# Patient Record
Sex: Female | Born: 1945 | Race: White | Hispanic: No | Smoking: Never smoker
Health system: Southern US, Community
[De-identification: ages and names within clinical notes are randomized; demographics above are authoritative.]

## PROBLEM LIST (undated history)

## (undated) DIAGNOSIS — J45909 Unspecified asthma, uncomplicated: Secondary | ICD-10-CM

## (undated) DIAGNOSIS — C50919 Malignant neoplasm of unspecified site of unspecified female breast: Secondary | ICD-10-CM

## (undated) DIAGNOSIS — J189 Pneumonia, unspecified organism: Secondary | ICD-10-CM

## (undated) DIAGNOSIS — R51 Headache: Secondary | ICD-10-CM

## (undated) DIAGNOSIS — R519 Headache, unspecified: Secondary | ICD-10-CM

## (undated) DIAGNOSIS — Z8489 Family history of other specified conditions: Secondary | ICD-10-CM

## (undated) HISTORY — DX: Malignant neoplasm of unspecified site of unspecified female breast: C50.919

## (undated) HISTORY — PX: TONSILLECTOMY: SUR1361

## (undated) HISTORY — PX: DILATION AND CURETTAGE OF UTERUS: SHX78

---

## 1975-06-04 HISTORY — PX: TUBAL LIGATION: SHX77

## 2003-08-25 ENCOUNTER — Emergency Department (HOSPITAL_COMMUNITY): Admission: EM | Admit: 2003-08-25 | Discharge: 2003-08-25 | Payer: Self-pay | Admitting: *Deleted

## 2006-02-21 ENCOUNTER — Ambulatory Visit (HOSPITAL_BASED_OUTPATIENT_CLINIC_OR_DEPARTMENT_OTHER): Admission: RE | Admit: 2006-02-21 | Discharge: 2006-02-21 | Payer: Self-pay | Admitting: Orthopedic Surgery

## 2008-06-03 HISTORY — PX: FINGER FRACTURE SURGERY: SHX638

## 2008-06-03 HISTORY — PX: WRIST FRACTURE SURGERY: SHX121

## 2013-07-04 HISTORY — PX: BREAST BIOPSY: SHX20

## 2013-08-03 ENCOUNTER — Ambulatory Visit (INDEPENDENT_AMBULATORY_CARE_PROVIDER_SITE_OTHER): Payer: Commercial Managed Care - HMO | Admitting: General Surgery

## 2013-08-03 ENCOUNTER — Encounter (INDEPENDENT_AMBULATORY_CARE_PROVIDER_SITE_OTHER): Payer: Self-pay | Admitting: General Surgery

## 2013-08-03 ENCOUNTER — Encounter (INDEPENDENT_AMBULATORY_CARE_PROVIDER_SITE_OTHER): Payer: Self-pay

## 2013-08-03 VITALS — BP 142/80 | HR 84 | Temp 98.4°F | Resp 16 | Ht 67.0 in | Wt 164.4 lb

## 2013-08-03 DIAGNOSIS — C50219 Malignant neoplasm of upper-inner quadrant of unspecified female breast: Secondary | ICD-10-CM

## 2013-08-03 DIAGNOSIS — C50211 Malignant neoplasm of upper-inner quadrant of right female breast: Secondary | ICD-10-CM | POA: Insufficient documentation

## 2013-08-03 NOTE — Progress Notes (Addendum)
Patient ID: Wendy [name], female   DOB: 1946-03-04, 68 y.o.   MRN: [id number]  Chief Complaint  Patient presents with  . Breast Cancer    new pt- eval rt breast cancer    HPI Wendy [name] is a 68 y.o. female.  She is referred to me by Dr. Nicki Reaper at [facility] in [place], and by Dr. 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  The patient has no prior history of breast problems. She gets annual mammograms. Her daughter is a Marine scientist. Her husband and daughter are here with her today. Recent screening mammograms revealed an abnormality in the right breast at the 3:00 position. She had a diagnostic right breast mammogram and ultrasound but I do not have those reports yet. The patient and daughter said that she was told this was about a half an inch in diameter. Image guided biopsy of the right breast at 3:00 position reveals invasive ductal carcinoma and some DCIS. 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 [id number]%. She has had no further evaluation or imaging.  Family history is negative for breast or ovarian cancer. It is positive for colon and bladder cancer.  We spent a longtime talking about things. One of the first thing she talked about is wanting to have a mastectomy and possible bilateral masectomies. I think she is still open I did about a lumpectomy but leaning toward mastectomy. I told her to think about whether she would want Reconstruction if she had mastectomy.   Comorbidities are minimal. She is on a prednisone dosepak recently because of bronchitis which happens once the winter. She otherwise is pretty healthy. She does not smoke.  HPI  History reviewed. No pertinent past medical history.  Past Surgical History  Procedure Laterality Date  . Tubal ligation  1977  . Hand surgery  2010    Family History  Problem Relation Age of Onset  . Cancer Sister      bladder  . Cancer Brother     colon  . Cancer Maternal Uncle     colon    Social History History  Substance Use Topics  . Smoking status: Never Smoker   . Smokeless tobacco: Not on file  . Alcohol Use: No    No Known Allergies  Current Outpatient Prescriptions  Medication Sig Dispense Refill  . albuterol (PROVENTIL) (2.5 MG/3ML) 0.083% nebulizer solution as needed.      Marland Kitchen CALCIUM PO Take by mouth.      . predniSONE (DELTASONE) 10 MG tablet as directed.       No current facility-administered medications for this visit.    Review of Systems Review of Systems  Constitutional: Negative for fever, chills and unexpected weight change.  HENT: Negative for congestion, hearing loss, sore throat, trouble swallowing and voice change.   Eyes: Negative for visual disturbance.  Respiratory: Negative for cough and wheezing.   Cardiovascular: Negative for chest pain, palpitations and leg swelling.  Gastrointestinal: Negative for nausea, vomiting, abdominal pain, diarrhea, constipation, blood in stool, abdominal distention and anal bleeding.  Genitourinary: Negative for hematuria, vaginal bleeding and difficulty urinating.  Musculoskeletal: Negative for arthralgias.  Skin: Negative for rash and wound.  Neurological: Negative for seizures, syncope and headaches.  Hematological: Negative for adenopathy. Does not bruise/bleed easily.  Psychiatric/Behavioral: Negative for confusion.    Blood pressure 142/80, pulse 84, temperature 98.4 F (36.9 C), temperature source Oral,  resp. rate 16, height 5\' 7"  (1.702 m), weight 164 lb 6.4 oz (74.571 kg).  Physical Exam Physical Exam  Constitutional: She is oriented to person, place, and time. She appears well-developed and well-nourished. No distress.  HENT:  Head: Normocephalic and atraumatic.  Nose: Nose normal.  Mouth/Throat: No oropharyngeal exudate.  Eyes: Conjunctivae and EOM are normal. Pupils are equal, round, and reactive to light. Left  eye exhibits no discharge. No scleral icterus.  Neck: Neck supple. No JVD present. No tracheal deviation present. No thyromegaly present.  Cardiovascular: Normal rate, regular rhythm, normal heart sounds and intact distal pulses.   No murmur heard. Pulmonary/Chest: Effort normal and breath sounds normal. No respiratory distress. She has no wheezes. She has no rales. She exhibits no tenderness.  Small ecchymoses at 3:00 position right breast, but no mass in either breast. No other skin changes. No axillary adenopathy.  Abdominal: Soft. Bowel sounds are normal. She exhibits no distension and no mass. There is no tenderness. There is no rebound and no guarding.  Musculoskeletal: She exhibits no edema and no tenderness.  Lymphadenopathy:    She has no cervical adenopathy.  Neurological: She is alert and oriented to person, place, and time. She exhibits normal muscle tone. Coordination normal.  Skin: Skin is warm. No rash noted. She is not diaphoretic. No erythema. No pallor.  Psychiatric: She has a normal mood and affect. Her behavior is normal. Judgment and thought content normal.    Data Reviewed Screening mammograms report. Biopsy report. Pathology report. Breast diagnostic profile. I have requested report and a disc of her should diagnostic mammogram and right breast ultrasound. MRI is scheduled. Medical oncology consultation scheduled. Assessment    Invasive ductal carcinoma right breast, 3:00 position. Clinically this is stage I, but I will need more information about tumor dimensions. This is a triple negative breast cancer, and so she will likely be offered adjuvant chemotherapy. I've discussed this with her and she is willing to have chemotherapy  We had a long talk about the role of surgery, chemotherapy, and radiation therapy in her care. We talked about lumpectomy sentinel lead biopsy radiation therapy. We talked about mastectomy, unilateral or bilateral. She is aware that she'll receive  chemotherapy regardless of the extent of surgery. She does as she has some decisions to make.  I told her to hold off making final decisions until she is evaluated by a medical oncologist.      She understands that she will likely be offered adjuvant chemotherapy and will likely need a Port-A-Cath. She is accepting of this.  I would also like for her to have bilateral breast MRI before making final recommendations. ADDENDUM: MRI shows the 1.5 cm mass in the 3:00 position on the right consistent with a known invasive ductal carcinoma. Nodular enhancement extends at 6.1 cm posterior from the mass worrisome for malignancy. Also there is indeterminate 1 cm area of non-masslike enhancement in the lower outer quadrant of the right breast. MRI guided biopsy of the posterior nodular enhancement was recommended. The left breast showed no mass or abnormal enhancement. There are no abnormal appearing lymph nodes. The patient will see me in the office day after tomorrow, and we will discuss this in detail.    Plan    Ambulatory referral to one of our medical oncologist ..she agrees.  Bilateral breast MRI  She was given breast cancer information package and carrying case  Return to see me after the MRI a medical oncology consultation for final  surgical incision planning.  ADDENDUM:  Dr. Marcy Panning sent me a brief note stating that the patient is interested in bilateral mastectomy upfront and that she will need a Port-A-Cath for eventual chemotherapy. She has an appointment to see me on March 11. We will finalize our plans at that time.       Edsel Petrin. Dalbert Batman, M.D., [facility] General and Minimally invasive Surgery Breast and Colorectal Surgery Office:   [phone] Pager:   [phone]  08/03/2013, 3:35 PM

## 2013-08-03 NOTE — Patient Instructions (Signed)
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 I suspect that this is an early breast cancer. There is a possibility that his spread to the lymph nodes, although I do not feel any abnormal lymph nodes.  We're going to get a copy of the reports and a disc of your diagnostic mammogram and ultrasound.  You'll be scheduled for bilateral breast MRI.  You'll be scheduled for a medical oncology consultation immediately.  Return to see Dr. Dalbert Batman as soon as possible after the MRI and the medical oncology consultation are completed, and we will discuss, decide  and plan definitive surgery at that time.     Mastectomy, With or Without Reconstruction Mastectomy (removal of the breast) is a procedure most commonly used to treat cancer (tumor) of the breast. Different procedures are available for treatment. This depends on the stage of the tumor (abnormal growths). Discuss this with your caregiver, surgeon (a specialist for performing operations such as this), or oncologist (someone specialized in the treatment of cancer). With proper information, you can decide which treatment is best for you. Although the sound of the word cancer is frightening to all of [place], the new treatments and medications can be a source of reassurance and comfort. If there are things you are worried about, discuss them with your caregiver. He or she can help comfort you and your family. Some of the different procedures for treating breast cancer are:  Radical (extensive) mastectomy. This is an operation used to remove the entire breast, the muscles under the breast, and all of the glands (lymph nodes) under the arm. With all of the new treatments available for cancer of the breast, this procedure has become less common.  Modified radical mastectomy. This is a similar operation to the radical mastectomy described above. In the modified radical mastectomy, the muscles of the chest wall  are not removed unless one of the lessor muscles is removed. One of the lessor muscles may be removed to allow better removal of the lymph nodes. The axillary lymph nodes are also removed. Rarely, during an axillary node dissection nerves to this area are damaged. Radiation therapy is then often used to the area following this surgery.  A total mastectomy also known as a complete or simple mastectomy. It involves removal of only the breast. The lymph nodes and the muscles are left in place.  In a lumpectomy, the lump is removed from the breast. This is the simplest form of surgical treatment. A sentinel lymph node biopsy may also be done. Additional treatment may be required. RISKS AND COMPLICATIONS The main problems that follow removal of the breast include:  Infection (germs start growing in the wound). This can usually be treated with antibiotics (medications that kill germs).  Lymphedema. This means the arm on the side of the breast that was operated on swells because the lymph (tissue fluid) cannot follow the main channels back into the body. This only occurs when the lymph nodes have had to be removed under the arm.  There may be some areas of numbness to the upper arm and around the incision (cut by the surgeon) in the breast. This happens because of the cutting of or damage to some of the nerves in the area. This is most often unavoidable.  There may be difficulty moving the arm in a full range of motion (moving in all directions) following surgery. This usually improves with time following use and exercise.  Recurrence of breast cancer may happen with the very best of surgery and follow up treatment. Sometimes small cancer cells that cannot be seen with the naked eye have already spread at the time of surgery. When this happens other treatment is available. This treatment may be radiation, medications or a combination of both. RECONSTRUCTION Reconstruction of the breast may be done  immediately if there is not going to be post-operative radiation. This surgery is done for cosmetic (improve appearance) purposes to improve the physical appearance after the operation. This may be done in two ways:  It can be done using a saline filled prosthetic (an artificial breast which is filled with salt water). Silicone breast implants are now re-approved by the FDA and are being commonly used.  Reconstruction can be done using the body's own muscle/fat/skin. Your caregiver will discuss your options with you. Depending upon your needs or choice, together you will be able to determine which procedure is best for you. Document Released: 02/12/2001 Document Revised: 02/12/2012 Document Reviewed: 10/06/2007 [facility] Patient Information 2014 [place].        Breast Cancer, Early Stage, Surgery Choices Breast cancer is the most common cancer, except for skin cancer. It is the second most common cause of death, except for lung cancer, in women. The risk of a woman developing breast cancer is 12.5%. Breast cancer in women younger than 71 years old is rare. Most of these cancer cases have spread to the breast from another part of the body (metastatic). Finding out that you have breast cancer is an emotional shock and is difficult to understand, because it is an overwhelming, serious, and life-threatening disease. You will need to make important decisions about how you want it treated.  As a woman with early-stage breast cancer (DCIS or Stage I, IIA, IIB, IIIA, or IV) you may be able to choose which type of breast surgery to have. Your caregiver will help you understand what stage you are in. Often, your choices are:  Removal of the cancerous part(s) of the breast (breast-sparing surgery).  Lumpectomy.  Partial/Segmental mastectomy.  Removal of the whole breast (simple mastectomy). Research shows that women with early-stage breast cancer who have breast-sparing surgery along with  radiation therapy live as long as those who have a mastectomy. Most women with breast cancer will lead long, healthy lives after treatment.  Reconstructive surgery. This type of surgery is to make the breast and nipple area as normal looking as it was before the mastectomy. It is usually done by a plastic surgeon at the same time as the mastectomy. There are several types of reconstructive surgery that should be discussed with your caregiver and plastic surgeon.  Lymph node surgery.  Sentinel (removing those lymph nodes in the armpit that breast cancer spreads to first).  Axillary lymph node dissection (removing all the lymph nodes in the armpit). TREATMENT Treatment for breast cancer usually begins a few weeks after diagnosis. In these weeks, you should meet with a surgeon, learn the facts about your surgery choices, treatment options, get a second opinion, and think about what is important to you.  Treatment choices include:  Surgery.  Radiation.  Chemotherapy.  Medicines, hormones.  Reconstructive breast surgery.  Any combination of the above treatments. Choose which kind of surgery to have. If radiation, chemotherapy, or hormone therapy is considered, this also should be discussed before the surgery. Radical breast surgery is rarely performed now. Usually, lumpectomy, partial/segmental mastectomy, simple mastectomy in combination with radiation, chemotherapy, and/or  hormone treatment is recommended. Clinical trial protocols (specific treatment plan with surgery and radiation, chemotherapy, and hormones) for breast cancer have been put in place in hospitals, universities, medical schools, and cancer centers all over the country. These patients are followed for several years to find out what treatment gives the best results for the protocol given, for the different stages of breast cancer.  Most women want to make this choice with help from their caregiver. After all, the kind of surgery  you have will affect how you look and feel. But it is often hard to decide what to do. The more information you have, the better the decision you can make.  Talk to a surgeon about your breast cancer surgery choices. Find out what happens during surgery, types of problems that sometimes occur, and other kinds of treatment (if any) you will need after surgery. Be sure to ask a lot of questions and learn as much as you can. You may also wish to talk with family members, friends, or others who have had breast cancer surgery, radiation therapy, chemotherapy, or hormone therapy.  After talking with a surgeon, you may want a second opinion. This means talking with another doctor or surgeon who might tell you about other treatment options. They may simply give you information that can help you feel better about the choice you are making. Do not worry about hurting your surgeon's feelings. It is common practice to get a second opinion, and some insurance companies require it. Plus, it is better to get a second opinion than to worry that you have made the wrong choice. STAGES OF BREAST CANCER Staging of breast cancer depends on the size of the tumor, if and how far it has spread, lymph node involvement in the armpits, and whether it has spread to other parts of the body. If you are unsure of the stage of your cancer, ask your caregiver. The following are the stages of breast cancer:  Stage 0: This means you either have DCIS or LCIS.  DCIS (Ductal Carcinoma in Situ) is very early breast cancer. It is often too small to form a lump. Your caregiver may refer to DCIS as noninvasive cancer.  LCIS (Lobular Carcinoma in Situ) is not cancer, but it may increase the chance that you will get breast cancer. Talk with your caregiver about treatment options, if you are diagnosed with LCIS.  The 5 year survival rate in this stage is almost 100%.  Stage I:  Your cancer is less than 1 inch across (2 centimeters) or about  the size of a quarter. The cancer is only in the breast. It has not spread to lymph nodes or other parts of your body.  Stage IIA:  No cancer is found in your breast. However, cancer is found in the lymph nodes under your arm, or  Your cancer is 1 inch (2 centimeters) or smaller. It has spread to 3 lymph nodes or less (the lymph nodes under your arm), or  Your cancer is about 1-2 inches (2-5 centimeters). It has not spread to the lymph nodes under your arm.  Stage IIB:  Your cancer is about 1-2 inches (2-5 centimeters). It has spread to the lymph nodes under your arm, or  Your cancer is larger than 2 inches (5 centimeters). It has not spread to the lymph nodes under your arm.  The 5 year survival rate is 85 to 90%.  Stage IIIA:  No cancer is found in the breast. It  is found in lymph nodes under your arm. The lymph nodes are attached to each other, or  Your cancer is 2 inches (5 centimeters) or smaller. It has spread to lymph nodes under your arm. The lymph nodes are attached to each other, or  Your cancer is larger than 2 inches (5 centimeters) and has spread to lymph nodes under your arm, and they are not attached to each other.  Your cancer is smaller than 2 inches (5 centimeters) and has spread to lymph nodes above your collarbone.  Inflammatory cancer of the breast (red rash, tender and swollen breasts) is in the stage III category.  The 5 year survival rate is 45 to 67%.  Stage IV:  Cancer cells have spread to other parts of the body.  The 5 year survival rate is about 20%. ABOUT LYMPH NODES  Lymph nodes are part of your body's immune system, which helps fight infection and disease. Lymph nodes are small, round, and clustered (like a bunch of grapes) throughout your body.  Axillary lymph nodes are in the area under your arm. Breast cancer may spread to these lymph nodes first, even when the tumor in the breast is small. This is why most surgeons take out some of these  lymph nodes at the time of breast surgery.  Lymphedema is swelling caused by a buildup of lymph fluid. You may have this type of swelling in your arm, if your lymph nodes are taken out with surgery or damaged by radiation therapy.  Lymphedema can show up soon after surgery. The symptoms are often mild and last for a short time.  Lymphedema can show up months or even years after cancer treatment is over. Often, lymphedema develops after an insect bite, minor injury, or burn on the arm where your lymph nodes were removed. Sometimes, this can be painful. One way to reduce the swelling is to work with a caregiver who specializes in rehabilitation, or with a physical therapist.  Sentinel lymph node biopsy is surgery to remove as few lymph nodes as possible from under the arm. The surgeon first injects a dye in the breast to see which lymph nodes the breast tumor drains into. Then, he or she removes these nodes to see if they have any cancer. If there is no cancer, the surgeon may leave the other lymph nodes in place. This surgery is fairly new and is being studied experimentally. Talk with your surgeon if you want to learn more. COMPARE YOUR CHOICES  BREAST-SPARING SURGERY  Is this surgery right for me?   Breast-sparing surgery with radiation is a safe choice for most women who have early-stage breast cancer. This means that your cancer is DCIS or at Stage I, IIA, IIB, or IIIA. What are the names of the different kinds of surgery?  Lumpectomy.  Partial mastectomy.  Breast-sparing surgery.  Segmental mastectomy.  Simple mastectomy.  Sentinel lymph node removal.  Axillary lymph node excision. What doctors am I likely to see?  Oncologist.  Surgeon.  Radiation Oncologist.  Chemotherapy Oncologist. What will my breast look like after surgery?  Your breast should look a lot like it did before surgery. But if your tumor is large, your breast may look different or smaller after  breast-sparing surgery. Will I have feeling in the area around my breast?  Yes. You should still have feeling in your breast, nipple, and areola (dark area around your nipple). Will I have pain after the surgery?  You may have pain after  surgery. Talk with your caregiver about ways to control this pain. What other problems can I expect?  You may feel very tired after radiation therapy. You may get swelling in your arm (lymphedema). Will I need more surgery?  Maybe. You may need more surgery to remove lymph nodes from under your arm. Also, if the surgeon does not remove all your cancer the first time, you may need more surgery. What other types of treatment will I need?  You may need radiation therapy, given almost every day for 5 to 8 weeks. You may also need chemotherapy, hormone therapy, or both. Will insurance pay for my surgery?  Check with your insurance company to find out how much it pays for breast cancer surgery and other needed treatments. Will the type of surgery I have affect how long I live?  Women with early-stage breast cancer who have breast-sparing surgery followed by radiation live just as long as women who have a mastectomy. Most women with breast cancer will lead long, healthy lives after treatment. What are the chances that my cancer will come back after surgery?  About 10% of women who have breast-sparing surgery along with radiation therapy get cancer in the same breast within 12 years. If this happens, you will need a mastectomy, but it will not affect how long you live. MASTECTOMY SURGERY Is this surgery right for me?   Mastectomy is a safe choice for women who have early-stage breast cancer (DCIS, Stage I, IIA, IIB, or IIIA). You may need a mastectomy if:  You have small breasts and a large tumor.  You have cancer in more than one part of your breast.  The tumor is under the nipple.  You do not have access to radiation therapy. What are the names of the  different kinds of surgery?  Total mastectomy.  Modified radical mastectomy (not usually done now).  Double mastectomy.  Simple mastectomy. What doctors am I likely to see?  Oncologist.  Surgeon.  Radiation Oncologist.  Chemotherapy Oncologist. What will my breast look like after surgery?  Your breast and nipple will be removed. You will have a flat chest on the side of your body where the breast was removed. Will I have feeling in the area around my breast?  Maybe. After surgery, you will have no feeling (numb) in your chest wall and maybe also under your arm. This numb feeling should go away in 1-2 years, but it will never feel like it did before. Also, the skin where your breast was may feel tight. Will I have pain after the surgery?  You may have pain after surgery. Talk with your caregiver about ways to control this pain. What other problems can I expect?  You may have pain in your neck or back. You may feel out of balance, if you had large breasts and do not have reconstruction surgery. You may get swelling in your arm (lymphedema). Will I need more surgery?  Maybe. You may need surgery to remove lymph nodes from under your arm. Also, if you have problems after your mastectomy, you may need to see your surgeon for treatment. What other types of treatment will I need?  You may also need chemotherapy, hormone therapy, or radiation therapy. Some women get all 3 types of therapy or any combination of the 3. Will insurance pay for my surgery?  Check with your insurance company to find out how much it pays for breast cancer surgery and other needed treatments. Will the  type of surgery I have affect how long I live?  Women with early-stage breast cancer who have a mastectomy live as long as women who have breast-sparing surgery followed by radiation therapy. Most women with breast cancer will lead long, healthy lives after treatment. What are the chances that my cancer will  come back after surgery?  About 5% of women who have a mastectomy will get cancer on the same side of their chest within 12 years. MASTECTOMY AND BREAST RECONSTRUCTION SURGERY  Is this surgery right for me?  If you have a mastectomy, you might also want breast reconstruction surgery.  You can choose to have reconstruction surgery at the same time as your mastectomy.  You can wait and have it at a later date. What are the names of the different kinds of surgery?  Breast implant.  Tissue flap surgery.  Reconstructive plastic surgery. What doctors am I likely to see?  Oncologist.  Surgeon.  Radiation Oncologist.  Reconstructive Plastic Surgeon.  Chemotherapy Oncologist. What will my breast look like after surgery?  Although you will have a breast-like shape, your breast will not look the same as it did before surgery. Will I have feeling in the area around my breast?  No. The area around your breast will always be numb (have no feeling). Will I have pain after the surgery?  You are likely to have pain after major surgery, such as mastectomy and reconstruction surgery. There are many ways to deal with pain. Let your caregiver know if you need relief from pain. What other problems can I expect?  It may take you many weeks or even months to recover from breast reconstruction surgery. If you have an implant, you may get infections, pain, or hardness. Also, you may not like how your breast-like shape looks. You may need more surgery if your implant breaks or leaks. If you have tissue flap surgery, you may lose strength in the part of your body where the flap came from. You may get swelling in your arm (lymphedema). Will I need more surgery?  Yes. You will need surgery at least 2 more times to build a new breast-like shape. With implants, you may need more surgery months or years later. You may also need surgery to remove lymph nodes from under your arm. What other types of  treatment will I need?  You may need chemotherapy, hormone therapy, or radiation therapy. Some women get all 3 types of therapy or any combination of the 3. Will insurance pay for my surgery?  Check with your insurance company to find out if it pays for breast reconstruction surgery. You should also ask if your insurance will pay for problems that may result from breast reconstruction surgery. Will the type of surgery I have affect how long I live?  Women with early-stage breast cancer who have a mastectomy live the same amount of time as women who have breast-sparing surgery followed by radiation therapy. Most women with breast cancer will lead long, healthy lives after treatment. What are the chances that my cancer will come back after surgery?  About 5% of women who have a mastectomy will get cancer on the same side of their chest within 12 years. Breast reconstruction surgery does not affect the chances of your cancer coming back. Where can I learn more about coping with life after cancer? To learn more about life after cancer, you might want to read "Facing Forward: Life After Cancer Treatment." You can get this booklet  at [employer].co.za or by calling 1-800-4-CANCER. THINK ABOUT WHAT IS IMPORTANT TO YOU   After you have talked with your surgeon and learned the facts, you may also want to talk with your spouse or partner, family, friends, or other women who have had breast cancer surgery. The more you know about breast cancer, the treatment, and what happens afterward, the more informed you will be and the easier it will be for you to make good decisions that are important to you.  Think about what is important to you. Here are some questions to think about:  Do I want to get a second opinion?  How important is it to me how my breast looks after cancer surgery?  How important is it to me how my breast feels after cancer surgery?  If I have breast-sparing surgery or more  extensive surgery, am I willing to get radiation, hormone and/or chemotherapy?  If I have a mastectomy, do I also want breast reconstruction surgery?  If I have breast reconstruction surgery, do I want it at the same time as my mastectomy?  What treatment does my insurance cover, and what do I need to pay for?  Who would I like to talk with about my surgery, radiation, hormone, and chemotherapy choices?  What else do I want to know, do, or learn before I make my choice about breast cancer surgery?  After I have learned all I could and have talked with my surgeon, I will make a choice that feels right for me.  A patient who takes the time to be well-informed will feel more comfortable about her decisions regarding surgery, and will feel better about the procedure following the surgery.  Coping and Support:  Be open and willing to talk to your family and friends about your cancer. Family and friends can be your best support group, to help you work through your concerns and worries.  Discussing your thoughts, concerns, and intimacy issues with your spouse or partner is very important and helpful.  Join support groups to share and learn how others with breast cancer cope and deal with their cancer. The [employer] Society can help you find support groups in your area.  Breast cancer may affect your confidence and feelings about being a total woman. It may interfere with your intimate relationship with your partner. Counseling may be necessary to help you overcome these feelings.  Talk to a counselor, your clergyperson, psychologist, or psychiatrist.  Talk to a medical social worker, if you have financial questions or problems.  Do not be afraid to ask for help, especially during your treatment.  Learn to be as independent as possible, as soon as possible. Document Released: 08/10/2003 Document Revised: 09/14/2012 Document Reviewed: 04/17/2009 [facility] Patient Information 2014  [place], [place].

## 2013-08-04 ENCOUNTER — Telehealth (INDEPENDENT_AMBULATORY_CARE_PROVIDER_SITE_OTHER): Payer: Self-pay | Admitting: *Deleted

## 2013-08-04 NOTE — Telephone Encounter (Signed)
I spoke with pt and informed her of the appt for her breast MRI at [id number] on 08/09/13 with an arrival time of 7:15am.  She is agreeable with this appt.

## 2013-08-05 ENCOUNTER — Telehealth: Payer: Self-pay | Admitting: *Deleted

## 2013-08-05 ENCOUNTER — Other Ambulatory Visit (INDEPENDENT_AMBULATORY_CARE_PROVIDER_SITE_OTHER): Payer: Self-pay

## 2013-08-05 DIAGNOSIS — C50919 Malignant neoplasm of unspecified site of unspecified female breast: Secondary | ICD-10-CM

## 2013-08-05 NOTE — Telephone Encounter (Signed)
Called pt back and spoke with her daughter Berline Chough) and informed her that we received the referral and I have gave the paperwork to Dr. Humphrey Rolls for an appt and I will contact her as soon as I get that back from her.  She has paperwork to bring to me for Dr. Humphrey Rolls and will bring tomorrow after her mom's MRI appt.  I informed Varney Biles so we can go ahead and get her an Alight bag with a [employer] and give her the Before Appt letter with Intake form.

## 2013-08-06 ENCOUNTER — Encounter (INDEPENDENT_AMBULATORY_CARE_PROVIDER_SITE_OTHER): Payer: Self-pay

## 2013-08-06 ENCOUNTER — Other Ambulatory Visit: Payer: Self-pay | Admitting: *Deleted

## 2013-08-06 ENCOUNTER — Encounter: Payer: Self-pay | Admitting: *Deleted

## 2013-08-06 DIAGNOSIS — C50219 Malignant neoplasm of upper-inner quadrant of unspecified female breast: Secondary | ICD-10-CM

## 2013-08-06 NOTE — Progress Notes (Signed)
Completed chart, Dawn entered labs, added to spreadsheet and placed in Dr. Laurelyn Sickle box.

## 2013-08-09 ENCOUNTER — Ambulatory Visit
Admission: RE | Admit: 2013-08-09 | Discharge: 2013-08-09 | Disposition: A | Discharge status: Home / Self Care | Payer: Commercial Managed Care - HMO | Source: Ambulatory Visit | Attending: General Surgery | Admitting: General Surgery

## 2013-08-09 DIAGNOSIS — C50211 Malignant neoplasm of upper-inner quadrant of right female breast: Secondary | ICD-10-CM

## 2013-08-09 MED ORDER — GADOBENATE DIMEGLUMINE 529 MG/ML IV SOLN
15.0000 mL | Freq: Once | INTRAVENOUS | Status: AC | PRN
  Administered 2013-08-09: 15 mL via INTRAVENOUS

## 2013-08-10 ENCOUNTER — Encounter (INDEPENDENT_AMBULATORY_CARE_PROVIDER_SITE_OTHER): Payer: Commercial Managed Care - HMO | Admitting: General Surgery

## 2013-08-10 ENCOUNTER — Ambulatory Visit (HOSPITAL_BASED_OUTPATIENT_CLINIC_OR_DEPARTMENT_OTHER): Payer: Commercial Managed Care - HMO

## 2013-08-10 ENCOUNTER — Ambulatory Visit (HOSPITAL_BASED_OUTPATIENT_CLINIC_OR_DEPARTMENT_OTHER): Payer: Commercial Managed Care - HMO | Admitting: Oncology

## 2013-08-10 ENCOUNTER — Encounter: Payer: Self-pay | Admitting: Oncology

## 2013-08-10 ENCOUNTER — Other Ambulatory Visit (HOSPITAL_BASED_OUTPATIENT_CLINIC_OR_DEPARTMENT_OTHER): Payer: Commercial Managed Care - HMO

## 2013-08-10 VITALS — BP 149/74 | HR 86 | Temp 98.1°F | Resp 18 | Ht 67.0 in | Wt 166.0 lb

## 2013-08-10 DIAGNOSIS — C50219 Malignant neoplasm of upper-inner quadrant of unspecified female breast: Secondary | ICD-10-CM

## 2013-08-10 DIAGNOSIS — C50919 Malignant neoplasm of unspecified site of unspecified female breast: Secondary | ICD-10-CM

## 2013-08-10 DIAGNOSIS — C50211 Malignant neoplasm of upper-inner quadrant of right female breast: Secondary | ICD-10-CM

## 2013-08-10 DIAGNOSIS — Z171 Estrogen receptor negative status [ER-]: Secondary | ICD-10-CM

## 2013-08-10 LAB — CBC WITH DIFFERENTIAL/PLATELET
BASO%: 0.4 % (ref 0.0–2.0)
Basophils Absolute: 0 10*3/uL (ref 0.0–0.1)
EOS%: 0.1 % (ref 0.0–7.0)
Eosinophils Absolute: 0 10*3/uL (ref 0.0–0.5)
HCT: 41 % (ref 34.8–46.6)
HGB: 13.5 g/dL (ref 11.6–15.9)
LYMPH#: 0.8 10*3/uL — AB (ref 0.9–3.3)
LYMPH%: 11.9 % — AB (ref 14.0–49.7)
MCH: 29.6 pg (ref 25.1–34.0)
MCHC: 32.9 g/dL (ref 31.5–36.0)
MCV: 89.9 fL (ref 79.5–101.0)
MONO#: 0.3 10*3/uL (ref 0.1–0.9)
MONO%: 4.7 % (ref 0.0–14.0)
NEUT#: 5.6 10*3/uL (ref 1.5–6.5)
NEUT%: 82.9 % — ABNORMAL HIGH (ref 38.4–76.8)
Platelets: 206 10*3/uL (ref 145–400)
RBC: 4.56 10*6/uL (ref 3.70–5.45)
RDW: 14.9 % — AB (ref 11.2–14.5)
WBC: 6.7 10*3/uL (ref 3.9–10.3)

## 2013-08-10 LAB — COMPREHENSIVE METABOLIC PANEL (CC13)
ALT: 15 U/L (ref 0–55)
AST: 17 U/L (ref 5–34)
Albumin: 3.8 g/dL (ref 3.5–5.0)
Alkaline Phosphatase: 58 U/L (ref 40–150)
Anion Gap: 9 mEq/L (ref 3–11)
BILIRUBIN TOTAL: 0.2 mg/dL (ref 0.20–1.20)
BUN: 17 mg/dL (ref 7.0–26.0)
CO2: 25 mEq/L (ref 22–29)
CREATININE: 0.9 mg/dL (ref 0.6–1.1)
Calcium: 9.8 mg/dL (ref 8.4–10.4)
Chloride: 108 mEq/L (ref 98–109)
GLUCOSE: 102 mg/dL (ref 70–140)
Potassium: 4.3 mEq/L (ref 3.5–5.1)
Sodium: 142 mEq/L (ref 136–145)
Total Protein: 7 g/dL (ref 6.4–8.3)

## 2013-08-10 NOTE — Progress Notes (Signed)
Wendy [name] [id number] [birth year] 68 y.o. 08/10/2013 4:07 PM  CC Dr. 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 Patient is seen for discussion of treatment options  STAGE:  Breast cancer of upper-inner quadrant of right female breast   Primary site: Breast (Right)   Staging method: AJCC 7th Edition   Clinical: Stage IA (T1c, NX, cM0) signed by [name], MD on 08/10/2013  4:11 PM   Summary: Stage IA (T1c, NX, cM0)    REFERRING PHYSICIAN: Dr. Fanny Skates  HISTORY OF PRESENT ILLNESS:  Wendy [name] is a 68 y.o. female.  Who has no significant past medical history. Most recently patient went for an annual mammogram and she was found to have an abnormality in the right breast at the 3:00 position. Subsequently had a right diagnostic mammogram performed. She also had a image guided biopsy of the right breast at the 3:00 position. 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 The tumor was ER negative PR negative HER-2/neu negative with a proliferation marker Ki-67 13%. All of her workup initially was performed in [place]. She subsequently was referred to Dr. Fanny Skates who saw her on 08/04/2003. He has recommended MRI of the breasts. This was performed on 08/09/2013. In the right breast there was an irregular enhancing mass in the 3:00 region measuring 1.5 x 1.0 x 1.4 cm. Also noted was nodular segmental enhancement extending 6.1 cm posteriorly from the mass towards the chest wall. The mass and nodular enhancement measured 7 cm. In the middle third of the lower outer quadrant of the right breast there was a 1.0 x 0.7 x 0.7 cm area of focal non-masslike enhancement. Left breast revealed no masses or abnormal enhancements. Lymph nodes appeared normal. With this information patient is now seen in medical oncology for discussion of treatment options. She herself is  without any complaints. She is accompanied by her husband as well as her daughter who is a Marine scientist. They have done a lot of research and reading.   Past Medical History: Past Medical History  Diagnosis Date  . Breast cancer     Past Surgical History: Past Surgical History  Procedure Laterality Date  . Tubal ligation  1977  . Hand surgery  2010    Family History: Family History  Problem Relation Age of Onset  . Cancer Sister     bladder  . Cancer Brother     colon  . Cancer Maternal Uncle     colon    Social History History  Substance Use Topics  . Smoking status: Never Smoker   . Smokeless tobacco: Never Used  . Alcohol Use: No    Allergies No Known Allergies  Current Medications: Current Outpatient Prescriptions  Medication Sig Dispense Refill  . albuterol (PROVENTIL) (2.5 MG/3ML) 0.083% nebulizer solution as needed.      Marland Kitchen CALCIUM PO Take by mouth.      . predniSONE (DELTASONE) 10 MG tablet as directed.       No current facility-administered medications for this visit.    OB/GYN History:menarche at 28, menopause in 4, age at first birth 66 (G90P2), HRT at 14 x 4-5 yrs  Fertility Discussion: N/A Prior History of Cancer: no  Health Maintenance:  Colonoscopy yes Bone Density yes Last PAP smear 2013  ECOG PERFORMANCE STATUS: 0 - Asymptomatic  Genetic Counseling/testing: yes   REVIEW OF SYSTEMS:  A comprehensive review of  systems was negative.  PHYSICAL EXAMINATION: Blood pressure 149/74, pulse 86, temperature 98.1 F (36.7 C), temperature source Oral, resp. rate 18, height '5\' 7"'  (1.702 m), weight 166 lb (75.297 kg).  General:  well-nourished in no acute distress.  Eyes:  no scleral icterus.  ENT:  There were no oropharyngeal lesions.  Neck was without thyromegaly.  Lymphatics:  Negative cervical, supraclavicular or axillary adenopathy.  Respiratory: lungs were clear bilaterally without wheezing or crackles.  Cardiovascular:  Regular rate and  rhythm, S1/S2, without murmur, rub or gallop.  There was no pedal edema.  GI:  abdomen was soft, flat, nontender, nondistended, without organomegaly.  Muscoloskeletal:  no spinal tenderness of palpation of vertebral spine.  Skin exam was without echymosis, petichae.  Neuro exam was nonfocal.  Patient was able to get on and off exam table without assistance.  Gait was normal.  Patient was alerted and oriented.  Attention was good.   Language was appropriate.  Mood was normal without depression.  Speech was not pressured.  Thought content was not tangential.   Breasts: Right breast reveals a slightly palpable mass in the 3:00 position likely hematoma otherwise no nipple discharge no other skin changes. Left breast no masses or nipple discharge.   STUDIES/RESULTS: Mr Breast Bilateral W Wo Contrast  08/09/2013   CLINICAL DATA:  Biopsy proven invasive ductal carcinoma and ductal carcinoma in situ in the 3 o'clock region of the right breast.  EXAM: BILATERAL BREAST MRI WITH AND WITHOUT CONTRAST  LABS:  BUN and creatinine were obtained on site at [facility] at  [address]  Results:  BUN 11 mg/dL,  Creatinine 1.0 mg/dL.  TECHNIQUE: Multiplanar, multisequence MR images of both breasts were obtained prior to and following the intravenous administration of 68m of MultiHance.  THREE-DIMENSIONAL MR IMAGE RENDERING ON INDEPENDENT WORKSTATION:  Three-dimensional MR images were rendered by post-processing of the original MR data on an independent workstation. The three-dimensional MR images were interpreted, and findings are reported in the following complete MRI report for this study. Three dimensional images were evaluated at the independent DynaCad workstation  COMPARISON:  Previous exams  FINDINGS: Breast composition: c:  Heterogeneous fibroglandular tissue  Background parenchymal enhancement: Moderate  Right breast: There is an irregular enhancing mass in the 3 o'clock region of the right breast  measuring 1.5 x 1.0 x 1.4 cm. Signal void artifact is seen in the mass from the biopsy clip. Nodular segmental enhancement extends 6.1 cm posteriorly from the mass towards the chest wall. The mass and nodular enhancement measures 7 cm. In the middle third of the lower outer quadrant of the left breast there is a 1.0 x 0.7 x 0.7 cm area of focal non masslike enhancement.  Left breast: No mass or abnormal enhancement.  Lymph nodes: No abnormal appearing lymph nodes.  Ancillary findings:  None.  IMPRESSION: 1.5 cm mass in the 3 o'clock region of the right breast corresponding with the patient's known invasive ductal carcinoma and ductal carcinoma in situ. Nodular enhancement extends 6.1 cm posteriorly from the mass worrisome for malignancy.  Indeterminate 1 cm area of non masslike enhancement in the lower outer quadrant of right breast.  RECOMMENDATION: MR guided core biopsy of the posterior nodular enhancement in the medial aspect of the right breast is recommended.  BI-RADS CATEGORY  4: Suspicious abnormality - biopsy should be considered.   Electronically Signed   By: DLillia MountainM.D.   On: 08/09/2013 12:34     LABS:  Chemistry   No results found for this basename: NA, K, CL, CO2, BUN, CREATININE, GLU   No results found for this basename: CALCIUM, ALKPHOS, AST, ALT, BILITOT      Lab Results  Component Value Date   WBC 6.7 08/10/2013   HGB 13.5 08/10/2013   HCT 41.0 08/10/2013   MCV 89.9 08/10/2013   PLT 206 08/10/2013     ASSESSMENT/PLAN    68 year old female with  #1 stage I (T1 N0) invasive ductal carcinoma of the right breast measuring 1.5 cm by MRI with a second area of concern the total measurement at 7 cm. She has another area in the right breast measuring 1.0 cm concerning again for malignancy. The biopsy tumor from the 3:00 position is triple negative with a proliferation marker Ki-67 of 13%. Patient has been seen by Dr. Fanny Skates. He discussed mastectomy versus a lumpectomy.  Patient's family and patient herself or telling me that she is leaning towards double mastectomy. She does not want to worry about disease again. We discussed the rationale for lumpectomy as well as mastectomies. Patient understands that whether or or not she gets mastectomies she will still need adjuvant chemotherapy. We discussed different treatment regimens.  #2 we discussed postop chemotherapy: This would consist of FEC every 2 weeks for a total of 6 cycles with day 2 Neulasta. This would then be followed by Taxol carboplatinum weekly for 12 weeks. We discussed risks benefits and side effects of the treatments very clearly. We discussed the rationale for utilizing carboplatinum in triple-negative disease.  #3 we also discussed genetics and genetic counseling. Patient is very interested in knowing and talking to the genetic counselor. An appointment will be set up for her.  #4 we discussed staging: Patient will be set up for CT of the chest abdomen and pelvis and PET scan.  #5 logistics of treatment: Patient will need an echocardiogram that she is getting epirubicin, she will also need chemotherapy teaching class, and a Port-A-Cath placement. Her port will be placed at the time of her definitive surgery.  #6 followup patient will be followed up after her surgery.  Clinical Trial Eligibility: no Multidisciplinary conference discussion yes                Discussion: Patient is being treated per NCCN breast cancer care guidelines appropriate for stage.I   Thank you so much for allowing me to participate in the care of Wendy [name]. I will continue to follow up the patient with you and assist in her care.  All questions were answered. The patient knows to call the clinic with any problems, questions or concerns. We can certainly see the patient much sooner if necessary.  I spent 55 minutes counseling the patient face to face. The total time spent in the appointment was 60  minutes.  Marcy Panning, MD Medical/Oncology [facility] [phone] (beeper) [phone] (Office)  08/10/2013, 4:07 PM

## 2013-08-10 NOTE — Progress Notes (Signed)
Checked in new patient with no financial issues. She has appt card and I gave her [facility] packet.

## 2013-08-11 ENCOUNTER — Ambulatory Visit (INDEPENDENT_AMBULATORY_CARE_PROVIDER_SITE_OTHER): Payer: Commercial Managed Care - HMO | Admitting: General Surgery

## 2013-08-11 ENCOUNTER — Encounter (INDEPENDENT_AMBULATORY_CARE_PROVIDER_SITE_OTHER): Payer: Self-pay | Admitting: General Surgery

## 2013-08-11 VITALS — BP 112/62 | HR 72 | Resp 12 | Wt 165.0 lb

## 2013-08-11 DIAGNOSIS — C50219 Malignant neoplasm of upper-inner quadrant of unspecified female breast: Secondary | ICD-10-CM

## 2013-08-11 DIAGNOSIS — C50211 Malignant neoplasm of upper-inner quadrant of right female breast: Secondary | ICD-10-CM

## 2013-08-11 NOTE — Patient Instructions (Signed)
We have discussed the findings on your MRI.  You had a medical oncology consultation with Dr. Marcy Panning. She has recommended adjuvant chemotherapy And genetic counseling.  You have stated that you would like to proceed with bilateral mastectomy, Port-A-Cath insertion, but you have declined consideration of immediate reconstruction at this point in time. Those are very reasonable decisions.  You will be scheduled for bilateral total mastectomies, right axillary sentinel node biopsy, and insertion of Port-A-Cath at Hospital in the near future as soon as possible.        Mastectomy, With or Without Reconstruction Mastectomy (removal of the breast) is a procedure most commonly used to treat cancer (tumor) of the breast. Different procedures are available for treatment. This depends on the stage of the tumor (abnormal growths). Discuss this with your caregiver, surgeon (a specialist for performing operations such as this), or oncologist (someone specialized in the treatment of cancer). With proper information, you can decide which treatment is best for you. Although the sound of the word cancer is frightening to all of [place], the new treatments and medications can be a source of reassurance and comfort. If there are things you are worried about, discuss them with your caregiver. He or she can help comfort you and your family. Some of the different procedures for treating breast cancer are:  Radical (extensive) mastectomy. This is an operation used to remove the entire breast, the muscles under the breast, and all of the glands (lymph nodes) under the arm. With all of the new treatments available for cancer of the breast, this procedure has become less common.  Modified radical mastectomy. This is a similar operation to the radical mastectomy described above. In the modified radical mastectomy, the muscles of the chest wall are not removed unless one of the lessor muscles is removed. One of the lessor  muscles may be removed to allow better removal of the lymph nodes. The axillary lymph nodes are also removed. Rarely, during an axillary node dissection nerves to this area are damaged. Radiation therapy is then often used to the area following this surgery.  A total mastectomy also known as a complete or simple mastectomy. It involves removal of only the breast. The lymph nodes and the muscles are left in place.  In a lumpectomy, the lump is removed from the breast. This is the simplest form of surgical treatment. A sentinel lymph node biopsy may also be done. Additional treatment may be required. RISKS AND COMPLICATIONS The main problems that follow removal of the breast include:  Infection (germs start growing in the wound). This can usually be treated with antibiotics (medications that kill germs).  Lymphedema. This means the arm on the side of the breast that was operated on swells because the lymph (tissue fluid) cannot follow the main channels back into the body. This only occurs when the lymph nodes have had to be removed under the arm.  There may be some areas of numbness to the upper arm and around the incision (cut by the surgeon) in the breast. This happens because of the cutting of or damage to some of the nerves in the area. This is most often unavoidable.  There may be difficulty moving the arm in a full range of motion (moving in all directions) following surgery. This usually improves with time following use and exercise.  Recurrence of breast cancer may happen with the very best of surgery and follow up treatment. Sometimes small cancer cells that cannot be seen with  the naked eye have already spread at the time of surgery. When this happens other treatment is available. This treatment may be radiation, medications or a combination of both. RECONSTRUCTION Reconstruction of the breast may be done immediately if there is not going to be post-operative radiation. This surgery is done  for cosmetic (improve appearance) purposes to improve the physical appearance after the operation. This may be done in two ways:  It can be done using a saline filled prosthetic (an artificial breast which is filled with salt water). Silicone breast implants are now re-approved by the FDA and are being commonly used.  Reconstruction can be done using the body's own muscle/fat/skin. Your caregiver will discuss your options with you. Depending upon your needs or choice, together you will be able to determine which procedure is best for you. Document Released: 02/12/2001 Document Revised: 02/12/2012 Document Reviewed: 10/06/2007 [facility] Patient Information 2014 [place].         Implanted Port Insertion An implanted port is a central line that has a round shape and is placed under the skin. It is used as a long-term IV access for:   Medicines, such as chemotherapy.   Fluids.   Liquid nutrition, such as total parenteral nutrition (TPN).   Blood samples.  LET [facility] CARE PROVIDER KNOW ABOUT:  Allergies to food or medicine.   Medicines taken, including vitamins, herbs, eye drops, creams, and over-the-counter medicines.   Any allergies to heparin.  Use of steroids (by mouth or creams).   Previous problems with anesthetics or numbing medicines.   History of bleeding problems or blood clots.   Previous surgery.   Other health problems, including diabetes and kidney problems.   Possibility of pregnancy, if this applies. RISKS AND COMPLICATIONS  Damage to the blood vessel, bruising, or bleeding at the puncture site.   Infection.  Blood clot in the vessel that the port is in.  Breakdown of the skin over your port.  Very rarely a person may develop a condition called a pneumothorax, a collection of air in the chest that may cause one of the lungs to collapse. The placement of these catheters with the appropriate imaging guidance significantly  decreases the risk of a pneumothorax.  BEFORE THE PROCEDURE   Your health care provider may want you to have blood tests. These tests can help tell how well your kidneys and liver are working. They can also show how well your blood clots.   If you take blood thinners (anticoagulant medicines), ask your health care provider when you should stop taking them.   Make arrangements for someone to drive you home. This is necessary if you have been sedated for your procedure.  PROCEDURE  Port insertion usually takes about 30 45 minutes.   An IV needle will be inserted in your arm. Medicine for pain and medicine to help relax you (sedative) will flow directly into your body through this needle.   You will lie on an exam table, and you will be connected to monitors to keep track of your heart rate, blood pressure, and breathing throughout the procedure.  An oxygen monitoring device may be attached to your finger. Oxygen will be given.   Everything is kept as germ free (sterile) as possible during the procedure. The skin near the point of the incision will be cleaned with antiseptic, and the area will be draped with sterile towels. The skin and deeper tissues over the port area will be made numb with a  local anesthetic.  Two small cuts (incisions) will be made in the skin to insert the port. One will be made in the neck to obtain access to the vein where the catheter will lie.   Because the port reservoir is placed under the skin, a small skin incision is made in the upper chest, and a small pocket for the port is made under the skin. The catheter connected to the port tunnels to a large central vein in the chest. A small, raised area remains on your body at the site of the reservoir when the procedure is complete.  The port placement is done under imaging guidance to ensure the proper placement.  The reservoir has a silicone covering that can be punctured with a special needle.   The port  is flushed with normal saline, and blood is drawn to make sure it is working properly.  There is nothing remaining outside the skin when the procedure is finished.   Incisions are held together by stitches, surgical glue, or a special tape.  AFTER THE PROCEDURE  You will stay in a recovery area until the anesthesia has worn off. Your blood pressure and pulse will be checked.  A final chest X-ray is taken to check the placement of the port and that there is no injury to your lung.  If there are no problems, you should be able to go home after the procedure.  Document Released: 03/10/2013 Document Reviewed: 01/25/2013 [facility] Patient Information 2014 [place], [place].

## 2013-08-11 NOTE — Progress Notes (Signed)
Patient ID: Wendy [name], female   DOB: Jan 07, 1946, 68 y.o.   MRN: [id number]  No chief complaint on file.   HPI Wendy [name] is a 68 y.o. female.  She returns for further discussion and surgical treatment planning for her right breast cancer.  Her initial presentation is detailed below: . She was referred to me by Dr. Nicki Reaper at [facility] family practice in [place], and by Dr. 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  The patient has no prior history of breast problems. She gets annual mammograms.  Recent screening mammograms revealed an abnormality in the right breast at the 3:00 position. She had a diagnostic right breast mammogram and ultrasound Which shows a 1.5 cm mass in the right breast at 3:00 position. Image guided biopsy of the right breast at 3:00 position reveals invasive ductal carcinoma and some DCIS. 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 Ki-6713%.  Family history is negative for breast or ovarian cancer. It is positive for colon and bladder cancer.  Subsequent MRI shows a 1.5 cm mass in the right breast at 3:00 position. There is nodular and hands but extending posteriorly 6.1 cm from the primary lesion and the second area of enhancement in the lower outer quadrant. The left breast and all of the lymph node bearing areas are normal. She has seen Dr. Marcy Panning who has recommended adjuvant chemotherapy and a Port-A-Cath. She also recommended genetic counseling. The patient told Dr. Humphrey Rolls and me today that she is decided that she would like bilateral mastectomies and she is also willing to undergo the Port-A-Cath insertion.She declines any consideration of reconstruction at this time. She is here today with her daughter and her husband. She would like to proceed with surgical planning as soon as possible. Comorbidities are minimal. She is on a prednisone dosepak recently because of  bronchitis which happens once the winter. She otherwise is pretty healthy. She does not smoke.    HPI  Past Medical History  Diagnosis Date  . Breast cancer     Past Surgical History  Procedure Laterality Date  . Tubal ligation  1977  . Hand surgery  2010    Family History  Problem Relation Age of Onset  . Cancer Sister     bladder  . Cancer Brother     colon  . Cancer Maternal Uncle     colon    Social History History  Substance Use Topics  . Smoking status: Never Smoker   . Smokeless tobacco: Never Used  . Alcohol Use: No    No Known Allergies  Current Outpatient Prescriptions  Medication Sig Dispense Refill  . albuterol (PROVENTIL) (2.5 MG/3ML) 0.083% nebulizer solution as needed.      Marland Kitchen CALCIUM PO Take by mouth.      . predniSONE (DELTASONE) 10 MG tablet as directed.       No current facility-administered medications for this visit.    Review of Systems Review of Systems  Constitutional: Negative for fever, chills and unexpected weight change.  HENT: Negative for congestion, hearing loss, sore throat, trouble swallowing and voice change.   Eyes: Negative for visual disturbance.  Respiratory: Negative for cough and wheezing.   Cardiovascular: Negative for chest pain, palpitations and leg swelling.  Gastrointestinal: Negative for nausea, vomiting, abdominal pain, diarrhea, constipation, blood in stool, abdominal distention and anal bleeding.  Genitourinary: Negative for hematuria, vaginal bleeding and  difficulty urinating.  Musculoskeletal: Negative for arthralgias.  Skin: Negative for rash and wound.  Neurological: Negative for seizures, syncope and headaches.  Hematological: Negative for adenopathy. Does not bruise/bleed easily.  Psychiatric/Behavioral: Negative for confusion.    Blood pressure 112/62, pulse 72, resp. rate 12, weight 165 lb (74.844 kg).  Physical Exam Constitutional: She is oriented to person, place, and time. She appears  well-developed and well-nourished. No distress.  HENT:  Head: Normocephalic and atraumatic.  Nose: Nose normal.  Mouth/Throat: No oropharyngeal exudate.  Eyes: Conjunctivae and EOM are normal. Pupils are equal, round, and reactive to light. Left eye exhibits no discharge. No scleral icterus.  Neck: Neck supple. No JVD present. No tracheal deviation present. No thyromegaly present.  Cardiovascular: Normal rate, regular rhythm, normal heart sounds and intact distal pulses.  No murmur heard.  Pulmonary/Chest: Effort normal and breath sounds normal. No respiratory distress. She has no wheezes. She has no rales. She exhibits no tenderness.  Small ecchymoses at 3:00 position right breast, but no mass in either breast. No other skin changes. No axillary adenopathy.  Abdominal: Soft. Bowel sounds are normal. She exhibits no distension and no mass. There is no tenderness. There is no rebound and no guarding.  Musculoskeletal: She exhibits no edema and no tenderness.  Lymphadenopathy:  She has no cervical adenopathy.  Neurological: She is alert and oriented to person, place, and time. She exhibits normal muscle tone. Coordination normal.  Skin: Skin is warm. No rash noted. She is not diaphoretic. No erythema. No pallor.  Psychiatric: She has a normal mood and affect. Her behavior is normal. Judgment and thought content normal   Physical Exam  Data Reviewed Dr. Laurelyn Sickle office note. Personal discussion with Dr. Humphrey Rolls. MRI report. Reports from [place] with current and past mammograms.  Assessment    Invasive ductal carcinoma right breast, 3:00 position. Clinically stage I by mammography but may be much more extensive by MRI. Triple negative breast cancer.  Adjuvant chemotherapy and genetic counseling has been recommended by her medical oncologist    Plan    Dr. Humphrey Rolls will arrange for counseling, and apparently is going to order a PET CT.  The patient will be scheduled for bilateral total  mastectomy, right axillary sentinel node biopsy, and Port-A-Cath insertion at [facility] hospital.  I discussed the indications, details, techniques, and numerous risk of the surgery with the patient and her family. She is aware of the risk of bleeding, infection, skin necrosis, arm swelling, nerve damage, shoulder contracture, pneumothorax, port malfunction, and other unforeseen problems. She understands all these issues well. At this time all of her questions were answered. She agrees with this plan.       Edsel Petrin. Dalbert Batman, M.D., [facility] General and Minimally invasive Surgery Breast and Colorectal Surgery Office:   [phone] Pager:   [phone]  08/11/2013, 3:10 PM

## 2013-08-12 ENCOUNTER — Other Ambulatory Visit: Payer: Self-pay | Admitting: *Deleted

## 2013-08-12 ENCOUNTER — Telehealth: Payer: Self-pay | Admitting: *Deleted

## 2013-08-12 ENCOUNTER — Telehealth: Payer: Self-pay

## 2013-08-12 DIAGNOSIS — C50211 Malignant neoplasm of upper-inner quadrant of right female breast: Secondary | ICD-10-CM

## 2013-08-12 NOTE — Telephone Encounter (Signed)
sw pt informed her that cs will call w/ appt for CT ABD/PLEVIS/ CHEST/ AND PET...td

## 2013-08-12 NOTE — Telephone Encounter (Signed)
Pt left message inquiring about scheduling for PET scan & genetics.  Last ofc visit 3/10.  She needs results from genetic prior to surgery 4/7.  Her insurance will pay for removal of left breast dependent on results.

## 2013-08-13 ENCOUNTER — Telehealth: Payer: Self-pay | Admitting: *Deleted

## 2013-08-13 NOTE — Telephone Encounter (Signed)
Confirmed 08/17/13 genetic appt w/ pt.  Emailed Santiago Glad to put the genetic appt in for me.

## 2013-08-17 ENCOUNTER — Encounter (INDEPENDENT_AMBULATORY_CARE_PROVIDER_SITE_OTHER): Payer: Self-pay

## 2013-08-17 ENCOUNTER — Telehealth: Payer: Self-pay | Admitting: *Deleted

## 2013-08-17 ENCOUNTER — Encounter: Payer: Self-pay | Admitting: Genetic Counselor

## 2013-08-17 ENCOUNTER — Other Ambulatory Visit (HOSPITAL_BASED_OUTPATIENT_CLINIC_OR_DEPARTMENT_OTHER): Payer: Commercial Managed Care - HMO

## 2013-08-17 ENCOUNTER — Other Ambulatory Visit: Payer: Self-pay | Admitting: Oncology

## 2013-08-17 ENCOUNTER — Ambulatory Visit (HOSPITAL_BASED_OUTPATIENT_CLINIC_OR_DEPARTMENT_OTHER): Payer: Commercial Managed Care - HMO | Admitting: Genetic Counselor

## 2013-08-17 DIAGNOSIS — C50211 Malignant neoplasm of upper-inner quadrant of right female breast: Secondary | ICD-10-CM

## 2013-08-17 DIAGNOSIS — Z800 Family history of malignant neoplasm of digestive organs: Secondary | ICD-10-CM

## 2013-08-17 DIAGNOSIS — Z803 Family history of malignant neoplasm of breast: Secondary | ICD-10-CM

## 2013-08-17 DIAGNOSIS — C50219 Malignant neoplasm of upper-inner quadrant of unspecified female breast: Secondary | ICD-10-CM

## 2013-08-17 DIAGNOSIS — Z8049 Family history of malignant neoplasm of other genital organs: Secondary | ICD-10-CM

## 2013-08-17 LAB — COMPREHENSIVE METABOLIC PANEL (CC13)
ALK PHOS: 61 U/L (ref 40–150)
ALT: 19 U/L (ref 0–55)
AST: 21 U/L (ref 5–34)
Albumin: 3.9 g/dL (ref 3.5–5.0)
Anion Gap: 11 mEq/L (ref 3–11)
BUN: 12.8 mg/dL (ref 7.0–26.0)
CALCIUM: 9.9 mg/dL (ref 8.4–10.4)
CO2: 25 mEq/L (ref 22–29)
CREATININE: 0.9 mg/dL (ref 0.6–1.1)
Chloride: 106 mEq/L (ref 98–109)
GLUCOSE: 98 mg/dL (ref 70–140)
Potassium: 4.1 mEq/L (ref 3.5–5.1)
Sodium: 143 mEq/L (ref 136–145)
Total Bilirubin: 0.34 mg/dL (ref 0.20–1.20)
Total Protein: 7.2 g/dL (ref 6.4–8.3)

## 2013-08-17 LAB — CBC WITH DIFFERENTIAL/PLATELET
BASO%: 0.6 % (ref 0.0–2.0)
BASOS ABS: 0 10*3/uL (ref 0.0–0.1)
EOS ABS: 0.1 10*3/uL (ref 0.0–0.5)
EOS%: 1.9 % (ref 0.0–7.0)
HCT: 41.2 % (ref 34.8–46.6)
HEMOGLOBIN: 13.8 g/dL (ref 11.6–15.9)
LYMPH%: 22.2 % (ref 14.0–49.7)
MCH: 30.1 pg (ref 25.1–34.0)
MCHC: 33.6 g/dL (ref 31.5–36.0)
MCV: 89.6 fL (ref 79.5–101.0)
MONO#: 0.4 10*3/uL (ref 0.1–0.9)
MONO%: 7.6 % (ref 0.0–14.0)
NEUT%: 67.7 % (ref 38.4–76.8)
NEUTROS ABS: 3.6 10*3/uL (ref 1.5–6.5)
Platelets: 201 10*3/uL (ref 145–400)
RBC: 4.59 10*6/uL (ref 3.70–5.45)
RDW: 15.3 % — ABNORMAL HIGH (ref 11.2–14.5)
WBC: 5.4 10*3/uL (ref 3.9–10.3)
lymph#: 1.2 10*3/uL (ref 0.9–3.3)

## 2013-08-17 NOTE — Telephone Encounter (Signed)
Per staff message and POF I have scheduled appts.  JMW  

## 2013-08-17 NOTE — Progress Notes (Signed)
Dr.  Marcy Panning requested a consultation for genetic counseling and risk assessment for Wendy [name], a 68 y.o. female, for discussion of her personal history of breast cancer and fmaily history of breast, colon, uterine and bladder cancer.  She presents to clinic today to discuss the possibility of a genetic predisposition to cancer, and to further clarify her risks, as well as her family members' risks for cancer.   HISTORY OF PRESENT ILLNESS: In 2015, at the age of 70, Wendy [name] was diagnosed with invasive ductal carinoma of the right breast. This will be treated with double mastectomy on September 07, 2013.  She is supposed to undergo chemotherapy and possibly radiation.  Her tumor is triple negative.  She has had 3 colonoscopies and has never had polyps.     Past Medical History  Diagnosis Date  . Breast cancer     Past Surgical History  Procedure Laterality Date  . Tubal ligation  1977  . Hand surgery  2010    History   Social History  . Marital Status: Married    Spouse Name: N/A    Number of Children: 2  . Years of Education: N/A   Social History Main Topics  . Smoking status: Never Smoker   . Smokeless tobacco: Never Used  . Alcohol Use: No  . Drug Use: No  . Sexual Activity: Yes   Other Topics Concern  . None   Social History Narrative  . None    REPRODUCTIVE HISTORY AND PERSONAL RISK ASSESSMENT FACTORS: Menarche was at age 40.   postmenopausal Uterus Intact: yes Ovaries Intact: yes G3P2A1, first live birth at age 30  She has not previously undergone treatment for infertility.   Oral Contraceptive use: 5 years   She has used HRT in the past.    FAMILY HISTORY:  We obtained a detailed, 4-generation family history.  Significant diagnoses are listed below: Family History  Problem Relation Age of Onset  . Bladder Cancer Sister 72    worked in [employer], around 2nd hand smoke all her life  . Diabetes Sister   . Colon cancer Brother 9    non-smoker   . Diabetes Brother   . Colon cancer Maternal Uncle 23  . Osteoporosis Mother   . Parkinson's disease Mother   . Diabetes Mother   . Rheum arthritis Father   . Diabetes Father   . Breast cancer Paternal Aunt     dx over 37  . Diabetes Maternal Grandmother   . Diabetes Maternal Grandfather   . Uterine cancer Other 25  The patient's mother had 92 brothers and sisters and her father had eight brothers and sisters.  Her daughter had precancerous cells of the cervix, that were not HPV related.  She had a consultation with Dr. Skeet Latch, and had surgery that removed half of her cervix.  Patient's maternal ancestors are of [place] descent, and paternal ancestors are of [place] descent. There is no reported Ashkenazi Jewish ancestry. There is no known consanguinity.  GENETIC COUNSELING ASSESSMENT: Wendy [name] is a 68 y.o. female with a personal history of breast cancer and family history of breast, uterine, bladder and colon cacner which somewhat suggestive of a hereditary cancer syndrome and predisposition to cancer. We, therefore, discussed and recommended the following at today's visit.   DISCUSSION: We reviewed the characteristics, features and inheritance patterns of hereditary cancer syndromes. We also discussed genetic testing, including the appropriate family members to test, the process of testing,  insurance coverage and turn-around-time for results. We discussed that the pattern in her family is most consistent with Lynch syndrome.  However, her family is quite large and typically we would expect to see a few more individuals with cancer, and possibly for her to have had some polyps.  We also discussed that possibly she has sporadic breast cancer and that her brother should consider testing, as he had colon cancer and has a daughter with uterine cancer.  She will let him know.  PLAN: After considering the risks, benefits, and limitations, Wendy [name] provided informed consent to pursue  genetic testing and the blood sample will be sent to [facility] for analysis of the Women's hereditary cancer panel. We discussed the implications of a positive, negative and/ or variant of uncertain significance genetic test result. Results should be available within approximately 3 weeks' time, at which point they will be disclosed by telephone to Wendy [name], as will any additional recommendations warranted by these results. Wendy [name] will receive a summary of her genetic counseling visit and a copy of her results once available. This information will also be available in Epic. We encouraged Wendy [name] to remain in contact with cancer genetics annually so that we can continuously update the family history and inform her of any changes in cancer genetics and testing that may be of benefit for her family. Wendy [name]'s questions were answered to her satisfaction today. Our contact information was provided should additional questions or concerns arise.  The patient was seen for a total of 60 minutes, greater than 50% of which was spent face-to-face counseling.  This note will also be sent to the referring provider via the electronic medical record. The patient will be supplied with a summary of this genetic counseling discussion as well as educational information on the discussed hereditary cancer syndromes following the conclusion of their visit.   Patient was discussed with Dr. Marcy Panning.   _______________________________________________________________________ For Office Staff:  Number of people involved in session: 2 Was an Intern/ student involved with case: no

## 2013-08-18 ENCOUNTER — Encounter: Payer: Self-pay | Admitting: Genetic Counselor

## 2013-08-19 ENCOUNTER — Other Ambulatory Visit: Payer: Commercial Managed Care - HMO

## 2013-08-19 ENCOUNTER — Encounter (INDEPENDENT_AMBULATORY_CARE_PROVIDER_SITE_OTHER): Payer: Self-pay

## 2013-08-19 ENCOUNTER — Ambulatory Visit (HOSPITAL_COMMUNITY)
Admission: RE | Admit: 2013-08-19 | Discharge: 2013-08-19 | Disposition: A | Discharge status: Home / Self Care | Payer: Medicare HMO | Source: Ambulatory Visit | Attending: Oncology | Admitting: Oncology

## 2013-08-19 ENCOUNTER — Encounter (HOSPITAL_COMMUNITY)
Admission: RE | Admit: 2013-08-19 | Discharge: 2013-08-19 | Disposition: A | Discharge status: Home / Self Care | Payer: Medicare HMO | Source: Ambulatory Visit | Attending: Oncology | Admitting: Oncology

## 2013-08-19 ENCOUNTER — Encounter: Payer: Self-pay | Admitting: *Deleted

## 2013-08-19 ENCOUNTER — Telehealth: Payer: Self-pay

## 2013-08-19 DIAGNOSIS — M5137 Other intervertebral disc degeneration, lumbosacral region: Secondary | ICD-10-CM | POA: Insufficient documentation

## 2013-08-19 DIAGNOSIS — C50219 Malignant neoplasm of upper-inner quadrant of unspecified female breast: Secondary | ICD-10-CM | POA: Insufficient documentation

## 2013-08-19 DIAGNOSIS — C50211 Malignant neoplasm of upper-inner quadrant of right female breast: Secondary | ICD-10-CM

## 2013-08-19 DIAGNOSIS — C50919 Malignant neoplasm of unspecified site of unspecified female breast: Secondary | ICD-10-CM | POA: Insufficient documentation

## 2013-08-19 DIAGNOSIS — M51379 Other intervertebral disc degeneration, lumbosacral region without mention of lumbar back pain or lower extremity pain: Secondary | ICD-10-CM | POA: Insufficient documentation

## 2013-08-19 LAB — GLUCOSE, CAPILLARY: Glucose-Capillary: 100 mg/dL — ABNORMAL HIGH (ref 70–99)

## 2013-08-19 MED ORDER — FLUDEOXYGLUCOSE F - 18 (FDG) INJECTION
8.9000 | Freq: Once | INTRAVENOUS | Status: AC | PRN
  Administered 2013-08-19: 8.9 via INTRAVENOUS

## 2013-08-19 MED ORDER — IOHEXOL 300 MG/ML  SOLN
100.0000 mL | Freq: Once | INTRAMUSCULAR | Status: AC | PRN
  Administered 2013-08-19: 100 mL via INTRAVENOUS

## 2013-08-19 NOTE — Telephone Encounter (Signed)
Pt called re: scheduling for ECHO.  Advised her that clinic's part is complete - we are working on and waiting for her insurance athorization.  Pt voiced understanding.

## 2013-08-23 ENCOUNTER — Telehealth: Payer: Self-pay

## 2013-08-23 NOTE — Telephone Encounter (Signed)
Pt requesting results of scans from last week.  Routed to [place].

## 2013-08-24 ENCOUNTER — Telehealth: Payer: Self-pay | Admitting: Oncology

## 2013-08-24 NOTE — Telephone Encounter (Signed)
, °

## 2013-08-25 ENCOUNTER — Encounter (INDEPENDENT_AMBULATORY_CARE_PROVIDER_SITE_OTHER): Payer: Self-pay

## 2013-08-26 ENCOUNTER — Encounter (HOSPITAL_COMMUNITY): Payer: Self-pay | Admitting: Pharmacy Technician

## 2013-08-27 ENCOUNTER — Telehealth: Payer: Self-pay | Admitting: *Deleted

## 2013-08-27 NOTE — Telephone Encounter (Signed)
CT AND PET SCAN WAS DONE ON 08/19/13. ON 08/23/13 PT. CALLED REQUESTING SCAN RESULTS. TODAY PT.'S DAUGHTER, LAUREL, HAS CALLED REQUESTING RESULTS OF THE CT AND PET SCAN. ABOVE INFORMATION WAS FORWARDED TO [place].

## 2013-08-27 NOTE — Telephone Encounter (Signed)
Let them know that the scans are fine, no cancer anywhere else

## 2013-08-30 LAB — PULMONARY FUNCTION TEST

## 2013-08-30 NOTE — Telephone Encounter (Signed)
NOTIFIED PT. "THAT THE SCANS ARE FINE. NO CANCER ANYWHERE ELSE".

## 2013-08-31 ENCOUNTER — Encounter (HOSPITAL_COMMUNITY)
Admission: RE | Admit: 2013-08-31 | Discharge: 2013-08-31 | Disposition: A | Discharge status: Home / Self Care | Payer: Medicare HMO | Source: Ambulatory Visit | Attending: General Surgery | Admitting: General Surgery

## 2013-08-31 ENCOUNTER — Encounter (HOSPITAL_COMMUNITY): Payer: Self-pay

## 2013-08-31 DIAGNOSIS — Z01812 Encounter for preprocedural laboratory examination: Secondary | ICD-10-CM | POA: Insufficient documentation

## 2013-08-31 DIAGNOSIS — Z01818 Encounter for other preprocedural examination: Secondary | ICD-10-CM | POA: Insufficient documentation

## 2013-08-31 HISTORY — DX: Pneumonia, unspecified organism: J18.9

## 2013-08-31 HISTORY — DX: Unspecified asthma, uncomplicated: J45.909

## 2013-08-31 LAB — COMPREHENSIVE METABOLIC PANEL
ALK PHOS: 70 U/L (ref 39–117)
ALT: 17 U/L (ref 0–35)
AST: 22 U/L (ref 0–37)
Albumin: 4 g/dL (ref 3.5–5.2)
BUN: 18 mg/dL (ref 6–23)
CO2: 27 meq/L (ref 19–32)
Calcium: 9.8 mg/dL (ref 8.4–10.5)
Chloride: 101 mEq/L (ref 96–112)
Creatinine, Ser: 0.9 mg/dL (ref 0.50–1.10)
GFR, EST AFRICAN AMERICAN: 74 mL/min — AB (ref >90)
GFR, EST NON AFRICAN AMERICAN: 64 mL/min — AB (ref >90)
Glucose, Bld: 99 mg/dL (ref 70–99)
POTASSIUM: 4.7 meq/L (ref 3.7–5.3)
Sodium: 141 mEq/L (ref 137–147)
Total Bilirubin: 0.3 mg/dL (ref 0.3–1.2)
Total Protein: 7.4 g/dL (ref 6.0–8.3)

## 2013-08-31 LAB — URINALYSIS, ROUTINE W REFLEX MICROSCOPIC
BILIRUBIN URINE: NEGATIVE
Glucose, UA: NEGATIVE mg/dL
Hgb urine dipstick: NEGATIVE
KETONES UR: NEGATIVE mg/dL
Leukocytes, UA: NEGATIVE
NITRITE: NEGATIVE
PH: 6.5 (ref 5.0–8.0)
Protein, ur: NEGATIVE mg/dL
Specific Gravity, Urine: 1.007 (ref 1.005–1.030)
UROBILINOGEN UA: 0.2 mg/dL (ref 0.0–1.0)

## 2013-08-31 LAB — CBC WITH DIFFERENTIAL/PLATELET
Basophils Absolute: 0 10*3/uL (ref 0.0–0.1)
Basophils Relative: 0 % (ref 0–1)
Eosinophils Absolute: 0.1 10*3/uL (ref 0.0–0.7)
Eosinophils Relative: 1 % (ref 0–5)
HCT: 43.3 % (ref 36.0–46.0)
Hemoglobin: 14.7 g/dL (ref 12.0–15.0)
LYMPHS ABS: 1.3 10*3/uL (ref 0.7–4.0)
LYMPHS PCT: 26 % (ref 12–46)
MCH: 30.4 pg (ref 26.0–34.0)
MCHC: 33.9 g/dL (ref 30.0–36.0)
MCV: 89.6 fL (ref 78.0–100.0)
Monocytes Absolute: 0.4 10*3/uL (ref 0.1–1.0)
Monocytes Relative: 7 % (ref 3–12)
Neutro Abs: 3.3 10*3/uL (ref 1.7–7.7)
Neutrophils Relative %: 66 % (ref 43–77)
PLATELETS: 242 10*3/uL (ref 150–400)
RBC: 4.83 MIL/uL (ref 3.87–5.11)
RDW: 15.2 % (ref 11.5–15.5)
WBC: 5.1 10*3/uL (ref 4.0–10.5)

## 2013-08-31 NOTE — Pre-Procedure Instructions (Signed)
Wendy [name]  08/31/2013   Your procedure is scheduled on:  09/07/13  Report to [place]  2 * 3 at 7 AM.  Call this number if you have problems the morning of surgery: [phone]   Remember:   Do not eat food or drink liquids after midnight.   Take these medicines the morning of surgery with A SIP OF WATER: all inhalers   Do not wear jewelry, make-up or nail polish.  Do not wear lotions, powders, or perfumes. You may wear deodorant.  Do not shave 48 hours prior to surgery. Men may shave face and neck.  Do not bring valuables to the hospital.  [facility] is not responsible                  for any belongings or valuables.               Contacts, dentures or bridgework may not be worn into surgery.  Leave suitcase in the car. After surgery it may be brought to your room.  For patients admitted to the hospital, discharge time is determined by your                treatment team.               Patients discharged the day of surgery will not be allowed to drive  home.  Name and phone number of your driver: family  Special Instructions: Shower using CHG 2 nights before surgery and the night before surgery.  If you shower the day of surgery use CHG.  Use special wash - you have one bottle of CHG for all showers.  You should use approximately 1/3 of the bottle for each shower.   Please read over the following fact sheets that you were given: Pain Booklet, Coughing and Deep Breathing and Surgical Site Infection Prevention

## 2013-09-03 ENCOUNTER — Ambulatory Visit (HOSPITAL_COMMUNITY)
Admission: RE | Admit: 2013-09-03 | Discharge: 2013-09-03 | Disposition: A | Discharge status: Home / Self Care | Payer: Medicare HMO | Source: Ambulatory Visit | Attending: Oncology | Admitting: Oncology

## 2013-09-03 DIAGNOSIS — C50211 Malignant neoplasm of upper-inner quadrant of right female breast: Secondary | ICD-10-CM

## 2013-09-03 DIAGNOSIS — C50919 Malignant neoplasm of unspecified site of unspecified female breast: Secondary | ICD-10-CM | POA: Insufficient documentation

## 2013-09-03 NOTE — Progress Notes (Signed)
  Echocardiogram 2D Echocardiogram has been performed.  Wendy [name] 09/03/2013, 11:25 AM

## 2013-09-05 NOTE — H&P (Signed)
Wendy [name]   MRN:  [id number]   Description: 68 year old female  Provider: [name], MD  Department: [facility]            Diagnoses      Breast cancer of upper-inner quadrant of right female breast    -  Primary      174.2                Current Vitals - Last Recorded      BP Pulse Resp Wt   112/62 72 12 165 lb (74.844 kg)                History and Physical    [name], MD    Status: Signed            Patient ID: Wendy [name], female   DOB: [birth year], 68 y.o.   MRN: [id number]           HPI Wendy [name] is a 68 y.o. female.  She returns for further discussion and surgical treatment planning for her right breast cancer.   Her initial presentation is detailed below: . She was referred to me by Dr. Nicki Reaper at [facility] family practice in [place], and by Dr. 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   The patient has no prior history of breast problems. She gets annual mammograms.  Recent screening mammograms revealed an abnormality in the right breast at the 3:00 position. She had a diagnostic right breast mammogram and ultrasound Which shows a 1.5 cm mass in the right breast at 3:00 position. Image guided biopsy of the right breast at 3:00 position reveals invasive ductal carcinoma and some DCIS. 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 Ki-6713%.   Family history is negative for breast or ovarian cancer. It is positive for colon and bladder cancer.   Subsequent MRI shows a 1.5 cm mass in the right breast at 3:00 position. There is nodular and hands but extending posteriorly 6.1 cm from the primary lesion and the second area of enhancement in the lower outer quadrant. The left breast and all of the lymph node bearing areas are normal. She has seen Dr. Marcy Panning who has recommended adjuvant chemotherapy and a Port-A-Cath. She also  recommended genetic counseling. The patient told Dr. Humphrey Rolls and me today that she is decided that she would like bilateral mastectomies and she is also willing to undergo the Port-A-Cath insertion.She declines any consideration of reconstruction at this time. She is here today with her daughter and her husband. She would like to proceed with surgical planning as soon as possible. Comorbidities are minimal. She is on a prednisone dosepak recently because of bronchitis which happens once the winter. She otherwise is pretty healthy. She does not smoke.         Past Medical History   Diagnosis  Date   .  Breast cancer           Past Surgical History   Procedure  Laterality  Date   .  Tubal ligation    1977   .  Hand surgery    2010         Family History   Problem  Relation  Age of Onset   .  Cancer  Sister         bladder   .  Cancer  Brother         colon   .  Cancer  Maternal Uncle         colon        Social History History   Substance Use Topics   .  Smoking status:  Never Smoker    .  Smokeless tobacco:  Never Used   .  Alcohol Use:  No        No Known Allergies    Current Outpatient Prescriptions   Medication  Sig  Dispense  Refill   .  albuterol (PROVENTIL) (2.5 MG/3ML) 0.083% nebulizer solution  as needed.         Marland Kitchen  CALCIUM PO  Take by mouth.         .  predniSONE (DELTASONE) 10 MG tablet  as directed.       .      Review of Systems   Constitutional: Negative for fever, chills and unexpected weight change.  HENT: Negative for congestion, hearing loss, sore throat, trouble swallowing and voice change.   Eyes: Negative for visual disturbance.  Respiratory: Negative for cough and wheezing.   Cardiovascular: Negative for chest pain, palpitations and leg swelling.  Gastrointestinal: Negative for nausea, vomiting, abdominal pain, diarrhea, constipation, blood in stool, abdominal distention and anal bleeding.  Genitourinary: Negative for hematuria,  vaginal bleeding and difficulty urinating.  Musculoskeletal: Negative for arthralgias.  Skin: Negative for rash and wound.  Neurological: Negative for seizures, syncope and headaches.  Hematological: Negative for adenopathy. Does not bruise/bleed easily.  Psychiatric/Behavioral: Negative for confusion.      Blood pressure 112/62, pulse 72, resp. rate 12, weight 165 lb (74.844 kg).   Physical Exam Constitutional: She is oriented to person, place, and time. She appears well-developed and well-nourished. No distress.   HENT:   Head: Normocephalic and atraumatic.   Nose: Nose normal.   Mouth/Throat: No oropharyngeal exudate.   Eyes: Conjunctivae and EOM are normal. Pupils are equal, round, and reactive to light. Left eye exhibits no discharge. No scleral icterus.   Neck: Neck supple. No JVD present. No tracheal deviation present. No thyromegaly present.   Cardiovascular: Normal rate, regular rhythm, normal heart sounds and intact distal pulses.   No murmur heard.   Pulmonary/Chest: Effort normal and breath sounds normal. No respiratory distress. She has no wheezes. She has no rales. She exhibits no tenderness.  Small ecchymoses at 3:00 position right breast, but no mass in either breast. No other skin changes. No axillary adenopathy.   Abdominal: Soft. Bowel sounds are normal. She exhibits no distension and no mass. There is no tenderness. There is no rebound and no guarding.  Musculoskeletal: She exhibits no edema and no tenderness.  Lymphadenopathy:  She has no cervical adenopathy.  Neurological: She is alert and oriented to person, place, and time. She exhibits normal muscle tone. Coordination normal.   Skin: Skin is warm. No rash noted. She is not diaphoretic. No erythema. No pallor.  Psychiatric: She has a normal mood and affect. Her behavior is normal. Judgment and thought content normal   Data Reviewed Dr. Laurelyn Sickle office note. Personal discussion with Dr. Humphrey Rolls. MRI report.  Reports from [place] with current and past mammograms.   Assessment     Invasive ductal carcinoma right breast, 3:00 position. Clinically stage I by mammography but may be much more extensive by MRI. Triple negative breast cancer.   Adjuvant chemotherapy and genetic counseling has been recommended by her medical oncologist  Plan    Dr. Humphrey Rolls will arrange for counseling, and apparently is going to order a PET CT.   The patient will be scheduled for bilateral total mastectomy, right axillary sentinel node biopsy, and Port-A-Cath insertion at [facility] hospital.   I discussed the indications, details, techniques, and numerous risk of the surgery with the patient and her family. She is aware of the risk of bleeding, infection, skin necrosis, arm swelling, nerve damage, shoulder contracture, pneumothorax, port malfunction, and other unforeseen problems. She understands all these issues well. At this time all of her questions were answered. She agrees with this plan.          Edsel Petrin. Dalbert Batman, M.D., [facility] General and Minimally invasive Surgery Breast and Colorectal Surgery Office:   [phone] Pager:   [phone]

## 2013-09-06 ENCOUNTER — Other Ambulatory Visit (INDEPENDENT_AMBULATORY_CARE_PROVIDER_SITE_OTHER): Payer: Self-pay

## 2013-09-06 ENCOUNTER — Telehealth: Payer: Self-pay | Admitting: *Deleted

## 2013-09-06 DIAGNOSIS — C50919 Malignant neoplasm of unspecified site of unspecified female breast: Secondary | ICD-10-CM

## 2013-09-06 MED ORDER — CEFAZOLIN SODIUM-DEXTROSE 2-3 GM-% IV SOLR
2.0000 g | INTRAVENOUS | Status: AC
  Administered 2013-09-07: 2 g via INTRAVENOUS
  Filled 2013-09-06: qty 50

## 2013-09-06 MED ORDER — UNABLE TO FIND: Status: DC

## 2013-09-06 NOTE — Telephone Encounter (Signed)
Message copied by Harmon Pier on Mon Sep 06, 2013  2:44 PM ------      Message from: Minette Headland      Created: Sun Sep 05, 2013 10:12 PM       Please call patient and inform her that her echo results were normal.            Thanks,       LC      ----- Message -----         From: [facility] Interface         Sent: 09/03/2013   2:13 PM           To: [name], MD                   ------

## 2013-09-06 NOTE — Telephone Encounter (Signed)
Called to inform pt that her Echo was normal. Pt verbalized understanding . No further concerns. Message to be forwarded to Charlestine Massed, NP.

## 2013-09-07 ENCOUNTER — Encounter (HOSPITAL_COMMUNITY): Payer: Medicare HMO | Admitting: Anesthesiology

## 2013-09-07 ENCOUNTER — Encounter (HOSPITAL_COMMUNITY): Payer: Self-pay | Admitting: Anesthesiology

## 2013-09-07 ENCOUNTER — Encounter (HOSPITAL_COMMUNITY)
Admission: RE | Disposition: A | Discharge status: Home / Self Care | Payer: Self-pay | Source: Ambulatory Visit | Attending: General Surgery

## 2013-09-07 ENCOUNTER — Ambulatory Visit (HOSPITAL_COMMUNITY): Payer: Medicare HMO

## 2013-09-07 ENCOUNTER — Ambulatory Visit (HOSPITAL_COMMUNITY)
Admission: RE | Admit: 2013-09-07 | Discharge: 2013-09-07 | Disposition: A | Discharge status: Home / Self Care | Payer: Medicare HMO | Source: Ambulatory Visit | Attending: General Surgery | Admitting: General Surgery

## 2013-09-07 ENCOUNTER — Ambulatory Visit (HOSPITAL_COMMUNITY): Payer: Medicare HMO | Admitting: Anesthesiology

## 2013-09-07 ENCOUNTER — Observation Stay (HOSPITAL_COMMUNITY)
Admission: RE | Admit: 2013-09-07 | Discharge: 2013-09-09 | Disposition: A | Discharge status: Home / Self Care | Payer: Medicare HMO | Source: Ambulatory Visit | Attending: General Surgery | Admitting: General Surgery

## 2013-09-07 DIAGNOSIS — R920 Mammographic microcalcification found on diagnostic imaging of breast: Secondary | ICD-10-CM

## 2013-09-07 DIAGNOSIS — C50211 Malignant neoplasm of upper-inner quadrant of right female breast: Secondary | ICD-10-CM

## 2013-09-07 DIAGNOSIS — C50911 Malignant neoplasm of unspecified site of right female breast: Secondary | ICD-10-CM | POA: Diagnosis present

## 2013-09-07 DIAGNOSIS — D0590 Unspecified type of carcinoma in situ of unspecified breast: Secondary | ICD-10-CM

## 2013-09-07 DIAGNOSIS — N6029 Fibroadenosis of unspecified breast: Secondary | ICD-10-CM

## 2013-09-07 DIAGNOSIS — J45909 Unspecified asthma, uncomplicated: Secondary | ICD-10-CM | POA: Insufficient documentation

## 2013-09-07 DIAGNOSIS — C50219 Malignant neoplasm of upper-inner quadrant of unspecified female breast: Principal | ICD-10-CM | POA: Insufficient documentation

## 2013-09-07 HISTORY — PX: MASTECTOMY COMPLETE / SIMPLE: SUR845

## 2013-09-07 HISTORY — DX: Family history of other specified conditions: Z84.89

## 2013-09-07 HISTORY — PX: MASTECTOMY COMPLETE / SIMPLE W/ SENTINEL NODE BIOPSY: SUR846

## 2013-09-07 HISTORY — PX: PORTACATH PLACEMENT: SHX2246

## 2013-09-07 HISTORY — DX: Headache: R51

## 2013-09-07 HISTORY — DX: Unspecified asthma, uncomplicated: J45.909

## 2013-09-07 HISTORY — PX: MASTECTOMY W/ SENTINEL NODE BIOPSY: SHX2001

## 2013-09-07 HISTORY — DX: Headache, unspecified: R51.9

## 2013-09-07 LAB — CREATININE, SERUM
CREATININE: 0.75 mg/dL (ref 0.50–1.10)
GFR calc Af Amer: >90 mL/min (ref >90)
GFR calc non Af Amer: 84 mL/min — ABNORMAL LOW (ref >90)

## 2013-09-07 LAB — CBC
HEMATOCRIT: 39.2 % (ref 36.0–46.0)
HEMOGLOBIN: 13.1 g/dL (ref 12.0–15.0)
MCH: 30.2 pg (ref 26.0–34.0)
MCHC: 33.4 g/dL (ref 30.0–36.0)
MCV: 90.3 fL (ref 78.0–100.0)
Platelets: 171 10*3/uL (ref 150–400)
RBC: 4.34 MIL/uL (ref 3.87–5.11)
RDW: 15.5 % (ref 11.5–15.5)
WBC: 10 10*3/uL (ref 4.0–10.5)

## 2013-09-07 SURGERY — MASTECTOMY WITH SENTINEL LYMPH NODE BIOPSY
Anesthesia: General | Site: Chest | Laterality: Right

## 2013-09-07 MED ORDER — HEPARIN SOD (PORK) LOCK FLUSH 100 UNIT/ML IV SOLN
INTRAVENOUS | Status: DC | PRN
  Administered 2013-09-07: 300 [IU] via INTRAVENOUS

## 2013-09-07 MED ORDER — CEFAZOLIN SODIUM-DEXTROSE 2-3 GM-% IV SOLR
2.0000 g | Freq: Three times a day (TID) | INTRAVENOUS | Status: AC
  Administered 2013-09-07 – 2013-09-08 (×3): 2 g via INTRAVENOUS
  Filled 2013-09-07 (×3): qty 50

## 2013-09-07 MED ORDER — ONDANSETRON HCL 4 MG/2ML IJ SOLN
INTRAMUSCULAR | Status: DC | PRN
Start: 2013-09-07 — End: 2013-09-07
  Administered 2013-09-07 (×2): 4 mg via INTRAVENOUS

## 2013-09-07 MED ORDER — STERILE WATER FOR INJECTION IJ SOLN
INTRAMUSCULAR | Status: AC
  Filled 2013-09-07: qty 10

## 2013-09-07 MED ORDER — NEOSTIGMINE METHYLSULFATE 1 MG/ML IJ SOLN
INTRAMUSCULAR | Status: DC | PRN
  Administered 2013-09-07: 3 mg via INTRAVENOUS

## 2013-09-07 MED ORDER — DIAZEPAM 5 MG/ML IJ SOLN
INTRAMUSCULAR | Status: AC
  Filled 2013-09-07: qty 2

## 2013-09-07 MED ORDER — OXYCODONE-ACETAMINOPHEN 5-325 MG PO TABS
1.0000 | ORAL_TABLET | ORAL | Status: DC | PRN
  Administered 2013-09-08 – 2013-09-09 (×3): 1 via ORAL
  Filled 2013-09-07 (×2): qty 1
  Filled 2013-09-07 (×2): qty 2

## 2013-09-07 MED ORDER — OXYCODONE HCL 5 MG/5ML PO SOLN: 5.0000 mg | Freq: Once | ORAL | Status: AC | PRN

## 2013-09-07 MED ORDER — METHYLENE BLUE 1 % INJ SOLN
INTRAMUSCULAR | Status: DC | PRN
  Administered 2013-09-07: 11:00:00

## 2013-09-07 MED ORDER — FENTANYL CITRATE 0.05 MG/ML IJ SOLN
50.0000 ug | Freq: Once | INTRAMUSCULAR | Status: AC
  Administered 2013-09-07: 50 ug via INTRAVENOUS
  Filled 2013-09-07: qty 2

## 2013-09-07 MED ORDER — SODIUM CHLORIDE 0.9 % IJ SOLN
INTRAMUSCULAR | Status: AC
  Filled 2013-09-07: qty 10

## 2013-09-07 MED ORDER — FENTANYL CITRATE 0.05 MG/ML IJ SOLN
INTRAMUSCULAR | Status: AC
  Filled 2013-09-07: qty 5

## 2013-09-07 MED ORDER — LIDOCAINE-EPINEPHRINE (PF) 1 %-1:200000 IJ SOLN
INTRAMUSCULAR | Status: DC | PRN
  Administered 2013-09-07: 30 mL

## 2013-09-07 MED ORDER — METHYLENE BLUE 1 % INJ SOLN
INTRAMUSCULAR | Status: AC
  Filled 2013-09-07: qty 10

## 2013-09-07 MED ORDER — HYDROMORPHONE HCL PF 1 MG/ML IJ SOLN
INTRAMUSCULAR | Status: AC
  Filled 2013-09-07: qty 1

## 2013-09-07 MED ORDER — EPHEDRINE SULFATE 50 MG/ML IJ SOLN
INTRAMUSCULAR | Status: AC
  Filled 2013-09-07: qty 1

## 2013-09-07 MED ORDER — NEOSTIGMINE METHYLSULFATE 1 MG/ML IJ SOLN
INTRAMUSCULAR | Status: AC
  Filled 2013-09-07: qty 10

## 2013-09-07 MED ORDER — SODIUM CHLORIDE 0.9 % IR SOLN
Status: DC | PRN
  Administered 2013-09-07: 10:00:00

## 2013-09-07 MED ORDER — DIAZEPAM 5 MG/ML IJ SOLN
3.0000 mg | Freq: Once | INTRAMUSCULAR | Status: AC
  Administered 2013-09-07: 3 mg via INTRAVENOUS

## 2013-09-07 MED ORDER — ROCURONIUM BROMIDE 100 MG/10ML IV SOLN
INTRAVENOUS | Status: DC | PRN
  Administered 2013-09-07: 10 mg via INTRAVENOUS
  Administered 2013-09-07: 40 mg via INTRAVENOUS

## 2013-09-07 MED ORDER — ONDANSETRON HCL 4 MG PO TABS: 4.0000 mg | ORAL_TABLET | Freq: Four times a day (QID) | ORAL | Status: DC | PRN

## 2013-09-07 MED ORDER — CALCIUM CARBONATE-VITAMIN D 500-50 MG-UNIT PO CAPS: ORAL_CAPSULE | Freq: Every day | ORAL | Status: DC

## 2013-09-07 MED ORDER — PROPOFOL 10 MG/ML IV BOLUS
INTRAVENOUS | Status: DC | PRN
  Administered 2013-09-07: 140 mg via INTRAVENOUS

## 2013-09-07 MED ORDER — PROMETHAZINE HCL 25 MG/ML IJ SOLN: 6.2500 mg | INTRAMUSCULAR | Status: DC | PRN

## 2013-09-07 MED ORDER — TECHNETIUM TC 99M SULFUR COLLOID FILTERED
1.0000 | Freq: Once | INTRAVENOUS | Status: AC | PRN
  Administered 2013-09-07: 1 via INTRADERMAL

## 2013-09-07 MED ORDER — LIDOCAINE HCL (CARDIAC) 20 MG/ML IV SOLN
INTRAVENOUS | Status: AC
  Filled 2013-09-07: qty 5

## 2013-09-07 MED ORDER — ARTIFICIAL TEARS OP OINT
TOPICAL_OINTMENT | OPHTHALMIC | Status: AC
  Filled 2013-09-07: qty 3.5

## 2013-09-07 MED ORDER — GLYCOPYRROLATE 0.2 MG/ML IJ SOLN
INTRAMUSCULAR | Status: AC
  Filled 2013-09-07: qty 2

## 2013-09-07 MED ORDER — MIDAZOLAM HCL 2 MG/2ML IJ SOLN
1.0000 mg | INTRAMUSCULAR | Status: DC | PRN
  Administered 2013-09-07: 1 mg via INTRAVENOUS
  Filled 2013-09-07: qty 2

## 2013-09-07 MED ORDER — HYDROMORPHONE HCL PF 1 MG/ML IJ SOLN
1.0000 mg | INTRAMUSCULAR | Status: DC | PRN
  Administered 2013-09-07 – 2013-09-08 (×2): 1 mg via INTRAVENOUS
  Filled 2013-09-07 (×3): qty 1

## 2013-09-07 MED ORDER — CALCIUM CARBONATE-VITAMIN D 500-200 MG-UNIT PO TABS
1.0000 | ORAL_TABLET | Freq: Every day | ORAL | Status: DC
  Administered 2013-09-09: 1 via ORAL
  Filled 2013-09-07 (×3): qty 1

## 2013-09-07 MED ORDER — BUDESONIDE-FORMOTEROL FUMARATE 160-4.5 MCG/ACT IN AERO
2.0000 | INHALATION_SPRAY | Freq: Two times a day (BID) | RESPIRATORY_TRACT | Status: DC
  Filled 2013-09-07: qty 6

## 2013-09-07 MED ORDER — DEXAMETHASONE SODIUM PHOSPHATE 4 MG/ML IJ SOLN
INTRAMUSCULAR | Status: DC | PRN
  Administered 2013-09-07: 4 mg via INTRAVENOUS

## 2013-09-07 MED ORDER — GLYCOPYRROLATE 0.2 MG/ML IJ SOLN
INTRAMUSCULAR | Status: DC | PRN
  Administered 2013-09-07: 0.4 mg via INTRAVENOUS

## 2013-09-07 MED ORDER — PHENYLEPHRINE HCL 10 MG/ML IJ SOLN
INTRAMUSCULAR | Status: DC | PRN
  Administered 2013-09-07 (×4): 80 ug via INTRAVENOUS

## 2013-09-07 MED ORDER — LACTATED RINGERS IV SOLN
INTRAVENOUS | Status: DC
  Administered 2013-09-07: 50 mL/h via INTRAVENOUS

## 2013-09-07 MED ORDER — HYDROMORPHONE HCL PF 1 MG/ML IJ SOLN
0.2500 mg | INTRAMUSCULAR | Status: DC | PRN
  Administered 2013-09-07 (×4): 0.5 mg via INTRAVENOUS

## 2013-09-07 MED ORDER — OXYCODONE HCL 5 MG PO TABS
ORAL_TABLET | ORAL | Status: AC
  Filled 2013-09-07: qty 1

## 2013-09-07 MED ORDER — DEXAMETHASONE SODIUM PHOSPHATE 4 MG/ML IJ SOLN
INTRAMUSCULAR | Status: AC
  Filled 2013-09-07: qty 1

## 2013-09-07 MED ORDER — POTASSIUM CHLORIDE IN NACL 20-0.9 MEQ/L-% IV SOLN
INTRAVENOUS | Status: DC
  Administered 2013-09-07 – 2013-09-08 (×4): via INTRAVENOUS
  Filled 2013-09-07 (×4): qty 1000

## 2013-09-07 MED ORDER — HYDROMORPHONE HCL PF 1 MG/ML IJ SOLN
INTRAMUSCULAR | Status: AC
  Administered 2013-09-07: 1 mg
  Filled 2013-09-07: qty 1

## 2013-09-07 MED ORDER — HEPARIN SOD (PORK) LOCK FLUSH 100 UNIT/ML IV SOLN
INTRAVENOUS | Status: AC
  Filled 2013-09-07: qty 5

## 2013-09-07 MED ORDER — ONDANSETRON HCL 4 MG/2ML IJ SOLN
4.0000 mg | Freq: Four times a day (QID) | INTRAMUSCULAR | Status: DC | PRN
  Administered 2013-09-07: 4 mg via INTRAVENOUS
  Filled 2013-09-07: qty 2

## 2013-09-07 MED ORDER — FENTANYL CITRATE 0.05 MG/ML IJ SOLN
INTRAMUSCULAR | Status: DC | PRN
  Administered 2013-09-07 (×10): 50 ug via INTRAVENOUS

## 2013-09-07 MED ORDER — ENOXAPARIN SODIUM 40 MG/0.4ML ~~LOC~~ SOLN
40.0000 mg | SUBCUTANEOUS | Status: DC
  Administered 2013-09-08: 40 mg via SUBCUTANEOUS
  Filled 2013-09-07 (×2): qty 0.4

## 2013-09-07 MED ORDER — LACTATED RINGERS IV SOLN
INTRAVENOUS | Status: DC | PRN
  Administered 2013-09-07 (×2): via INTRAVENOUS

## 2013-09-07 MED ORDER — ONDANSETRON HCL 4 MG/2ML IJ SOLN
INTRAMUSCULAR | Status: AC
  Filled 2013-09-07: qty 4

## 2013-09-07 MED ORDER — EPHEDRINE SULFATE 50 MG/ML IJ SOLN
INTRAMUSCULAR | Status: DC | PRN
  Administered 2013-09-07 (×2): 10 mg via INTRAVENOUS

## 2013-09-07 MED ORDER — OXYCODONE HCL 5 MG PO TABS
5.0000 mg | ORAL_TABLET | Freq: Once | ORAL | Status: AC | PRN
  Administered 2013-09-07: 5 mg via ORAL

## 2013-09-07 MED ORDER — LIDOCAINE HCL (CARDIAC) 20 MG/ML IV SOLN
INTRAVENOUS | Status: DC | PRN
  Administered 2013-09-07: 40 mg via INTRAVENOUS

## 2013-09-07 MED ORDER — ARTIFICIAL TEARS OP OINT
TOPICAL_OINTMENT | OPHTHALMIC | Status: DC | PRN
  Administered 2013-09-07: 1 via OPHTHALMIC

## 2013-09-07 SURGICAL SUPPLY — 89 items
APPLIER CLIP 9.375 MED OPEN (MISCELLANEOUS) ×4
BAG DECANTER FOR FLEXI CONT (MISCELLANEOUS) ×4 IMPLANT
BINDER BREAST LRG (GAUZE/BANDAGES/DRESSINGS) ×4 IMPLANT
BINDER BREAST XLRG (GAUZE/BANDAGES/DRESSINGS) IMPLANT
BLADE SURG 10 STRL SS (BLADE) ×4 IMPLANT
BLADE SURG 11 STRL SS (BLADE) ×4 IMPLANT
BLADE SURG 15 STRL LF DISP TIS (BLADE) ×2 IMPLANT
BLADE SURG 15 STRL SS (BLADE) ×2
BLADE SURG ROTATE 9660 (MISCELLANEOUS) IMPLANT
CANISTER SUCTION 2500CC (MISCELLANEOUS) ×4 IMPLANT
CHLORAPREP W/TINT 10.5 ML (MISCELLANEOUS) IMPLANT
CHLORAPREP W/TINT 26ML (MISCELLANEOUS) ×8 IMPLANT
CLIP APPLIE 9.375 MED OPEN (MISCELLANEOUS) ×2 IMPLANT
CONT SPEC 4OZ CLIKSEAL STRL BL (MISCELLANEOUS) ×8 IMPLANT
COVER PROBE W GEL 5X96 (DRAPES) ×4 IMPLANT
COVER SURGICAL LIGHT HANDLE (MISCELLANEOUS) ×4 IMPLANT
CRADLE DONUT ADULT HEAD (MISCELLANEOUS) ×4 IMPLANT
DECANTER SPIKE VIAL GLASS SM (MISCELLANEOUS) ×4 IMPLANT
DERMABOND ADVANCED (GAUZE/BANDAGES/DRESSINGS) ×6
DERMABOND ADVANCED .7 DNX12 (GAUZE/BANDAGES/DRESSINGS) ×6 IMPLANT
DRAIN CHANNEL 19F RND (DRAIN) ×16 IMPLANT
DRAPE C-ARM 42X72 X-RAY (DRAPES) ×4 IMPLANT
DRAPE LAPAROSCOPIC ABDOMINAL (DRAPES) ×8 IMPLANT
DRAPE PROXIMA HALF (DRAPES) ×12 IMPLANT
DRAPE UTILITY 15X26 W/TAPE STR (DRAPE) ×8 IMPLANT
DRSG PAD ABDOMINAL 8X10 ST (GAUZE/BANDAGES/DRESSINGS) ×8 IMPLANT
ELECT BLADE 4.0 EZ CLEAN MEGAD (MISCELLANEOUS) ×4
ELECT CAUTERY BLADE 6.4 (BLADE) ×8 IMPLANT
ELECT REM PT RETURN 9FT ADLT (ELECTROSURGICAL) ×4
ELECTRODE BLDE 4.0 EZ CLN MEGD (MISCELLANEOUS) ×2 IMPLANT
ELECTRODE REM PT RTRN 9FT ADLT (ELECTROSURGICAL) ×2 IMPLANT
EVACUATOR SILICONE 100CC (DRAIN) ×16 IMPLANT
GAUZE SPONGE 4X4 16PLY XRAY LF (GAUZE/BANDAGES/DRESSINGS) ×4 IMPLANT
GLOVE BIO SURGEON STRL SZ 6 (GLOVE) ×4 IMPLANT
GLOVE BIO SURGEON STRL SZ7 (GLOVE) ×8 IMPLANT
GLOVE BIO SURGEON STRL SZ7.5 (GLOVE) ×4 IMPLANT
GLOVE BIOGEL PI IND STRL 6.5 (GLOVE) ×2 IMPLANT
GLOVE BIOGEL PI IND STRL 7.0 (GLOVE) ×4 IMPLANT
GLOVE BIOGEL PI IND STRL 7.5 (GLOVE) ×4 IMPLANT
GLOVE BIOGEL PI INDICATOR 6.5 (GLOVE) ×2
GLOVE BIOGEL PI INDICATOR 7.0 (GLOVE) ×4
GLOVE BIOGEL PI INDICATOR 7.5 (GLOVE) ×4
GLOVE ECLIPSE 6.5 STRL STRAW (GLOVE) ×4 IMPLANT
GLOVE EUDERMIC 7 POWDERFREE (GLOVE) ×8 IMPLANT
GLOVE SS BIOGEL STRL SZ 6.5 (GLOVE) ×2 IMPLANT
GLOVE SUPERSENSE BIOGEL SZ 6.5 (GLOVE) ×2
GLOVE SURG SS PI 7.0 STRL IVOR (GLOVE) ×8 IMPLANT
GOWN STRL REUS W/ TWL LRG LVL3 (GOWN DISPOSABLE) ×10 IMPLANT
GOWN STRL REUS W/ TWL XL LVL3 (GOWN DISPOSABLE) ×4 IMPLANT
GOWN STRL REUS W/TWL 2XL LVL3 (GOWN DISPOSABLE) ×4 IMPLANT
GOWN STRL REUS W/TWL LRG LVL3 (GOWN DISPOSABLE) ×10
GOWN STRL REUS W/TWL XL LVL3 (GOWN DISPOSABLE) ×4
INTRODUCER 13FR (MISCELLANEOUS) IMPLANT
INTRODUCER COOK 11FR (CATHETERS) IMPLANT
KIT BASIN OR (CUSTOM PROCEDURE TRAY) ×4 IMPLANT
KIT PORT POWER 8FR ISP CVUE (Catheter) ×4 IMPLANT
KIT PORT POWER 9.6FR MRI PREA (Catheter) IMPLANT
KIT PORT POWER ISP 8FR (Catheter) IMPLANT
KIT POWER CATH 8FR (Catheter) IMPLANT
KIT ROOM TURNOVER OR (KITS) ×4 IMPLANT
NEEDLE 18GX1X1/2 (RX/OR ONLY) (NEEDLE) ×4 IMPLANT
NEEDLE HYPO 25GX1X1/2 BEV (NEEDLE) ×8 IMPLANT
NS IRRIG 1000ML POUR BTL (IV SOLUTION) ×4 IMPLANT
PACK GENERAL/GYN (CUSTOM PROCEDURE TRAY) ×4 IMPLANT
PACK SURGICAL SETUP 50X90 (CUSTOM PROCEDURE TRAY) IMPLANT
PAD ARMBOARD 7.5X6 YLW CONV (MISCELLANEOUS) ×4 IMPLANT
PENCIL BUTTON HOLSTER BLD 10FT (ELECTRODE) ×8 IMPLANT
SET INTRODUCER 12FR PACEMAKER (SHEATH) IMPLANT
SET SHEATH INTRODUCER 10FR (MISCELLANEOUS) IMPLANT
SHEATH COOK PEEL AWAY SET 9F (SHEATH) IMPLANT
SPECIMEN JAR X LARGE (MISCELLANEOUS) ×8 IMPLANT
SPONGE GAUZE 4X4 12PLY STER LF (GAUZE/BANDAGES/DRESSINGS) ×4 IMPLANT
SPONGE LAP 18X18 X RAY DECT (DISPOSABLE) ×12 IMPLANT
SURGILUBE 3G PEEL PACK STRL (MISCELLANEOUS) IMPLANT
SUT ETHILON 3 0 FSL (SUTURE) ×16 IMPLANT
SUT MNCRL AB 4-0 PS2 18 (SUTURE) ×8 IMPLANT
SUT PROLENE 2 0 CT2 30 (SUTURE) ×8 IMPLANT
SUT SILK 2 0 FS (SUTURE) ×4 IMPLANT
SUT VIC AB 3-0 SH 18 (SUTURE) ×16 IMPLANT
SYR 5ML LUER SLIP (SYRINGE) ×4 IMPLANT
SYR BULB 3OZ (MISCELLANEOUS) ×4 IMPLANT
SYR CONTROL 10ML LL (SYRINGE) ×8 IMPLANT
SYRINGE 10CC LL (SYRINGE) ×4 IMPLANT
TAPE CLOTH SURG 6X10 WHT LF (GAUZE/BANDAGES/DRESSINGS) ×4 IMPLANT
TOWEL OR 17X24 6PK STRL BLUE (TOWEL DISPOSABLE) ×4 IMPLANT
TOWEL OR 17X26 10 PK STRL BLUE (TOWEL DISPOSABLE) ×4 IMPLANT
TUBE CONNECTING 12'X1/4 (SUCTIONS) ×1
TUBE CONNECTING 12X1/4 (SUCTIONS) ×3 IMPLANT
YANKAUER SUCT BULB TIP NO VENT (SUCTIONS) ×4 IMPLANT

## 2013-09-07 NOTE — Interval H&P Note (Signed)
History and Physical Interval Note:  09/07/2013 8:20 AM  Wendy [name]  has presented today for surgery, with the diagnosis of right breast cancer  The goals and the various methods of treatment have been discussed with the patient and family. After consideration of risks, benefits and other options for treatment, the patient has consented to  Procedure(s): BILATERAL MASTECTOMY WITH RIGHT SENTINEL LYMPH NODE BIOPSY (Bilateral) INSERTION PORT-A-CATH (N/A) as a surgical intervention .  The patient's history has been reviewed, patient examined today, no change in status, stable for surgery.  I have reviewed the patient's chart and labs.  Questions were answered to the patient's satisfaction.     [name]

## 2013-09-07 NOTE — Anesthesia Procedure Notes (Signed)
Procedure Name: Intubation Date/Time: 09/07/2013 8:56 AM Performed by: Erik Obey Pre-anesthesia Checklist: Patient identified, Patient being monitored, Emergency Drugs available, Timeout performed and Suction available Patient Re-evaluated:Patient Re-evaluated prior to inductionOxygen Delivery Method: Circle system utilized Preoxygenation: Pre-oxygenation with 100% oxygen Intubation Type: IV induction Ventilation: Mask ventilation without difficulty Laryngoscope Size: Mac and 3 Grade View: Grade I Tube type: Oral Tube size: 7.5 mm Number of attempts: 1 Airway Equipment and Method: Stylet Placement Confirmation: ETT inserted through vocal cords under direct vision,  positive ETCO2 and breath sounds checked- equal and bilateral Secured at: 21 cm Tube secured with: Tape Dental Injury: Teeth and Oropharynx as per pre-operative assessment

## 2013-09-07 NOTE — Transfer of Care (Signed)
Immediate Anesthesia Transfer of Care Note  Patient: Wendy [name]  Procedure(s) Performed: Procedure(s): BILATERAL MASTECTOMY WITH RIGHT SENTINEL LYMPH NODE BIOPSY (Bilateral) INSERTION PORT-A-CATH (Right)  Patient Location: PACU  Anesthesia Type:General  Level of Consciousness: awake, alert  and oriented  Airway & Oxygen Therapy: Patient Spontanous Breathing and Patient connected to nasal cannula oxygen  Post-op Assessment: Report given to PACU RN and Post -op Vital signs reviewed and stable  Post vital signs: Reviewed and stable  Complications: No apparent anesthesia complications

## 2013-09-07 NOTE — Op Note (Signed)
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 Clinically stage I by mammography but may be much more extensive by MRI. Triple negative breast cancer.   Post op Diagnosis:    Same  Procedure:                 Insertion of power port ClearView 8 French tunneled venous vascular access device Use of fluoroscopy for guidance and positioning Right total mastectomy Right axillary  sentinel node biopsy Injection blue dye right breast Left total mastectomy  Surgeon:                     Edsel Petrin. Dalbert Batman, M.D., FACS  Assistant:                      Stark Klein, mD  Operative Indications:   She was referred to me by Dr. Nicki Reaper at [facility] family practice in [place], and by Dr. Conchita Paris at [facility] radiology for evaluation of a newly diagnosed invasive ductal carcinoma of the right breast at the 3:00 position.  The patient has no prior history of breast problems. She gets annual mammograms. Recent screening mammograms revealed an abnormality in the right breast at the 3:00 position. She had a diagnostic right breast mammogram and ultrasound Which shows a 1.5 cm mass in the right breast at 3:00 position. Image guided biopsy of the right breast at 3:00 position reveals invasive ductal carcinoma and some DCIS. 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 Ki-67 13%.  Family history is negative for breast or ovarian cancer. It is positive for colon and bladder cancer.  Subsequent MRI shows a 1.5 cm mass in the right breast at 3:00 position. There is nodular enhancement extending posteriorly 6.1 cm from the primary lesion and the second area of enhancement in the lower outer quadrant. The left breast and all of the lymph node bearing areas are normal.  She has seen Dr. Marcy Panning who has recommended adjuvant chemotherapy and a Port-A-Cath. She also has undergone  genetic counseling.  The patient  decided that she desired bilateral mastectomies and she is also willing to undergo the Port-A-Cath insertion.She declines any consideration of reconstruction at this time. She declined any further biopsies. She developed bronchitis which delayed her surgery about 2 weeks..  Comorbidities are minimal. Sh e took  a prednisone dosepak  because of bronchitis which happens usually in the winter. She otherwise is pretty healthy. She does not smoke.    Operative Findings:       The port was placed through the right subclavian vein without difficulty and appeared to be well positioned on the intraoperative studies. It flushes well has excellent blood return. There was no gross abnormality of either breast. We found 2 right axillary sentinel lymph nodes.  Procedure in Detail:          The patient underwent injection of radionuclide in the right breast by the nuclear medicine technician. The patient was then taken to the operating room and underwent general endotracheal anesthesia. Following his surgical time out an alcohol prep I injected 5 cc of blue dye into the right breast, subareolar area. This was 2 cc of methylene blue mixed with 3 cc of saline. The right breast was massaged for a  few minutes.      She was positioned with a small roll behind her shoulders and her arms tucked at her sides. The neck and chest were prepped and draped in a sterile fashion. Intravenous antibiotics were given. 0.5% Marcaine with epinephrine was used for local infiltration anesthetic. A right subclavian venipuncture was performed and a guidewire inserted into the superior vena cava under fluoroscopic guidance. Using the C-arm I marked a template on the chest wall to plan the positioning of the catheter. A small incision was made at the wire insertion site. I made a transverse incision about 2-1/2 cm below the medial right clavicle. Subcutaneous pocket was created. The port was passed from the wire insertion site to the port pocket  site. Using the template from the chest wall I chose to cut the catheter 22 cm. This was so that the tip of the catheter would be approximately in the superior vena cava at right atrial junction. The catheter was secured to the port with the locking device and the port and catheter flushed with heparinized saline. The port was sutured to the pectoralis fascia with 3 interrupted sutures of 2-0 Prolene. The dilator and peel-away sheath assembly was then inserted over the guidewire into the central venous circulation and the wire and dilator removed. The catheter was threaded into the peel away sheath easily and the peel-away sheath removed. The catheter flushed easily and had excellent blood return. Fluoroscopy confirmed that the catheter tip was in the superior vena cava just above the right atrium and there was no deformity of the catheter anywhere along its course. The port and catheter were flushed with concentrated heparin. The subcutaneous tissue was closed with 3-0 Vicryl sutures and the skin incisions were closed with subcuticular suture of 4-0 Monocryl and Dermabond.        We then removed the drapes, brought her arms at her sides, and reprepped and redraped the neck and chest and upper abdomen. Another surgical time out was performed. Using a marking pen I very carefully drew symmetrical transverse elliptical incisions, to encompass the nipple and areolar complexes. I will dictate the mastectomy technique once it since it was essentially the same on both sides. A transverse elliptical incision was made on each side. Skin flaps raised superiorly to the infraclavicular area, medially to the parasternal area, inferiorly to the anterior rectus sheath and laterally to latissimus dorsi muscle. On the right side I used the neoprobe and found two sentinel lymph nodes that were very hot and very blue up in level I lymph nodes. These were sent separately. After this was done both breasts were dissected off of the  pectoralis major and minor muscles with electrocautery. Each breast was marked with a silk suture to mark the  lateral skin margin. Each breast was marked and  sent separately as a specimen. Hemostasis was excellent and achieved with electrocautery. Both wounds were copiously irrigated. Both mastectomy wounds required 2 drains. One drain was placed across the skin flaps and one up into the axillary space. These were brought out through separate stab incision infero-laterally, sutured to the skin with nylon sutures and connected to suction bulbs. Mastectomy incisions were closed in 2 layers. The deeper layer of interrupted 3-0 Vicryl subcutaneous tissue and the skin was closed with a running subcuticular suture of 4-0 Monocryl and Dermabond. Dry bandages and a breast binder were placed. The patient was taken to PACU in stable condition. EBL 100 cc or less. Counts correct. Consultations none.  Edsel Petrin. Dalbert Batman, M.D., FACS General and Minimally Invasive Surgery Breast and Colorectal Surgery  09/07/2013 12:07 PM

## 2013-09-07 NOTE — Anesthesia Postprocedure Evaluation (Signed)
  Anesthesia Post-op Note  Patient: Wendy [name]  Procedure(s) Performed: Procedure(s): BILATERAL MASTECTOMY WITH RIGHT SENTINEL LYMPH NODE BIOPSY (Bilateral) INSERTION PORT-A-CATH (Right)  Patient Location: PACU  Anesthesia Type:General  Level of Consciousness: awake  Airway and Oxygen Therapy: Patient Spontanous Breathing  Post-op Pain: mild  Post-op Assessment: Post-op Vital signs reviewed, Patient's Cardiovascular Status Stable, Respiratory Function Stable, Patent Airway, No signs of Nausea or vomiting and Pain level controlled  Post-op Vital Signs: Reviewed and stable  Complications: No apparent anesthesia complications

## 2013-09-07 NOTE — Anesthesia Preprocedure Evaluation (Addendum)
Anesthesia Evaluation  Patient identified by MRN, date of birth, ID band Patient awake    Reviewed: Allergy & Precautions, H&P , NPO status , Patient's Chart, lab work & pertinent test results  Airway Mallampati: I      Dental   Pulmonary asthma ,  breath sounds clear to auscultation        Cardiovascular Rhythm:Regular Rate:Normal     Neuro/Psych    GI/Hepatic   Endo/Other    Renal/GU      Musculoskeletal   Abdominal   Peds  Hematology   Anesthesia Other Findings Breast ca  Reproductive/Obstetrics                          Anesthesia Physical Anesthesia Plan  ASA: II  Anesthesia Plan: General   Post-op Pain Management:    Induction: Intravenous  Airway Management Planned: Oral ETT  Additional Equipment:   Intra-op Plan:   Post-operative Plan: Extubation in OR  Informed Consent: I have reviewed the patients History and Physical, chart, labs and discussed the procedure including the risks, benefits and alternatives for the proposed anesthesia with the patient or authorized representative who has indicated his/her understanding and acceptance.     Plan Discussed with: CRNA and Surgeon  Anesthesia Plan Comments:         Anesthesia Quick Evaluation

## 2013-09-08 ENCOUNTER — Encounter: Payer: Self-pay | Admitting: Genetic Counselor

## 2013-09-08 DIAGNOSIS — C50911 Malignant neoplasm of unspecified site of right female breast: Secondary | ICD-10-CM

## 2013-09-08 DIAGNOSIS — C50211 Malignant neoplasm of upper-inner quadrant of right female breast: Secondary | ICD-10-CM

## 2013-09-08 NOTE — Progress Notes (Signed)
1 Day Post-Op  Subjective: Doing fairly well. Stable and alert. Has been up to bathroom 3 times voiding without difficulty. No dizziness. Transient nausea which seems to have resolved. No vomiting. Pain seems to be under good control. No breathing problems. Urine output excellent, seems to be diuresing.  All drains functioning. Serosanguineous. Total of 85 cc overnight.  Postop chest x-ray showed satisfactory positioning of Port-A-Cath. Negative for pneumothorax.    Objective: Vital signs in last 24 hours: Temp:  [97.6 F (36.4 C)-98.4 F (36.9 C)] 98.4 F (36.9 C) (04/08 0245) Pulse Rate:  [57-99] 80 (04/08 0245) Resp:  [10-20] 18 (04/08 0245) BP: (95-130)/(46-68) 95/46 mmHg (04/08 0245) SpO2:  [95 %-100 %] 97 % (04/08 0245) Weight:  [164 lb 1.6 oz (74.435 kg)] 164 lb 1.6 oz (74.435 kg) (04/07 0749) Last BM Date: 09/07/13  Intake/Output from previous day: 04/07 0701 - 04/08 0700 In: 2276 [I.V.:2219] Out: 2485 [Urine:2250; Drains:85; Blood:150] Intake/Output this shift: Total I/O In: 719 [I.V.:719] Out: 2035 [Urine:1950; Drains:85]   EXAM: General appearance: alert. Oriented. No distress. Mental status normal. Appears comfortable. Resp: clear to auscultation bilaterally Breasts: , bilateral mastectomy skin flaps look good. Flat. Viable. No necrosis. No hematoma. Serosanguineous drainage. Port site  right infraclavicular area also looks fine. Minimal bruising.  Lab Results:  Results for orders placed during the hospital encounter of 09/07/13 (from the past 24 hour(s))  CBC     Status: None   Collection Time    09/07/13  4:51 PM      Result Value Ref Range   WBC 10.0  4.0 - 10.5 K/uL   RBC 4.34  3.87 - 5.11 MIL/uL   Hemoglobin 13.1  12.0 - 15.0 g/dL   HCT 39.2  36.0 - 46.0 %   MCV 90.3  78.0 - 100.0 fL   MCH 30.2  26.0 - 34.0 pg   MCHC 33.4  30.0 - 36.0 g/dL   RDW 15.5  11.5 - 15.5 %   Platelets 171  150 - 400 K/uL  CREATININE, SERUM     Status: Abnormal   Collection Time    09/07/13  4:52 PM      Result Value Ref Range   Creatinine, Ser 0.75  0.50 - 1.10 mg/dL   GFR calc non Af Amer 84 (*) >90 mL/min   GFR calc Af Amer >90  >90 mL/min     Studies/Results: Nm Sentinel Node Inj-no Rpt (breast)  09/07/2013   CLINICAL DATA: Cancer right breast   Sulfur colloid was injected intradermally by the nuclear medicine  technologist for breast cancer sentinel node localization.    Dg Chest Port 1 View  09/07/2013   CLINICAL DATA:  Port-A-Cath placement  EXAM: PORTABLE CHEST - 1 VIEW  COMPARISON:  Chest radiograph 08/31/2013  FINDINGS: A right subclavian power Port-A-Cath has been placed. The distal tip is projects over the distal superior vena cava. Negative for pneumothorax. There is streaky left basilar atelectasis. Otherwise the lungs are clear. The heart and mediastinal contours are stable. There are postsurgical changes of bilateral mastectomy with soft tissue drains in place. Small amounts of subcutaneous gas project over the lateral chest walls bilaterally.  IMPRESSION: 1. Satisfactory position of right subclavian Port-A-Cath. 2. Negative for pneumothorax. 3. Postsurgical changes of bilateral mastectomies.   Electronically Signed   By: Curlene Dolphin M.D.   On: 09/07/2013 13:41   Dg Fluoro Guide Cv Line-no Report  09/07/2013   CLINICAL DATA: BREAST CANCER   FLOURO GUIDE CV  LINE  Fluoroscopy was utilized by the requesting physician.  No radiographic  interpretation.     . calcium-vitamin D  1 tablet Oral Q breakfast  .  ceFAZolin (ANCEF) IV  2 g Intravenous 3 times per day  . enoxaparin (LOVENOX) injection  40 mg Subcutaneous Q24H     Assessment/Plan: s/p Procedure(s): BILATERAL MASTECTOMY WITH RIGHT SENTINEL LYMPH NODE BIOPSY INSERTION PORT-A-CATH  POD #1. Bilateral mastectomy, right sentinel lymph node biopsy, insert Port-A-Cath. Stable, doing well Advance diet and activities. Home tomorrow, unless she changes her mind and wants to go home  this afternoon. Hopefully, pathology will be out tomorrow.  @PROBHOSP @  LOS: 1 day    [name] 09/08/2013  . .prob

## 2013-09-08 NOTE — Progress Notes (Signed)
HPI:  Ms. Haning was previously seen in the [facility] clinic due to a personal and family of cancer and concerns regarding a hereditary predisposition to cancer. Please refer to our prior cancer genetics clinic note for more information regarding Ms. Ghee's medical, social and family histories, and our assessment and recommendations, at the time. Ms. Klutts recent genetic test results were disclosed to her, as were recommendations warranted by these results. These results and recommendations are discussed in more detail below.  GENETIC TEST RESULTS: At the time of Ms. Rosselli's visit, we recommended she pursue genetic testing of the Women's hereditary cancer gene panel. This test, which included sequencing and deletion/duplication analysis of the genes listed on the test report, was performed at [facility]. Genetic testing was normal, and did not reveal a mutation in these genes. A complete list of all genes tested is located on the test report scanned into EPIC.    We discussed with Ms. Mogel that since the current genetic testing is not perfect, it is possible there may be a gene mutation in one of these genes that current testing cannot detect, but that chance is small.  We also discussed, that it is possible that another gene that has not yet been discovered, or that we have not yet tested, is responsible for the cancer diagnoses in the family, and it is, therefore, important to remain in touch with cancer genetics in the future so that we can continue to offer Ms. Goyne the most up to date genetic testing.   CANCER SCREENING RECOMMENDATIONS: This result is reassuring and suggests that Ms. Nick's cancer was most likely not due to an inherited predisposition associated with one of these genes.  Most cancers happen by chance and this negative test, along with details of her family history, suggests that her cancer falls into this category.  We, therefore, recommended she  continue to follow the cancer management and screening guidelines provided by her primary providers.   RECOMMENDATIONS FOR FAMILY MEMBERS:  Women in this family might be at some increased risk of developing cancer, over the general population risk, simply due to the family history of cancer.  We recommended women in this family have a yearly mammogram beginning at age 58, an an annual clinical breast exam, and perform monthly breast self-exams. Women in this family should also have a gynecological exam as recommended by their primary provider. All family members should have a colonoscopy by age 20.  FOLLOW-UP: Lastly, we discussed with Ms. Shelburne that cancer genetics is a rapidly advancing field and it is possible that new genetic tests will be appropriate for her and/or her family members in the future. We encouraged her to remain in contact with cancer genetics on an annual basis so we can update her personal and family histories and let her know of advances in cancer genetics that may benefit this family.   Our contact number was provided. Ms. Sieh questions were answered to her satisfaction, and she knows she is welcome to call us at anytime with additional questions or concerns. This patient was discussed with Dr. Humphrey Rolls who agrees with the above.   Catherine A. Fine, MS, CGC Certified Genetic Counseor phone: [phone] [email or web address]

## 2013-09-09 ENCOUNTER — Encounter (HOSPITAL_COMMUNITY): Payer: Self-pay | Admitting: General Surgery

## 2013-09-09 ENCOUNTER — Telehealth (INDEPENDENT_AMBULATORY_CARE_PROVIDER_SITE_OTHER): Payer: Self-pay

## 2013-09-09 MED ORDER — HYDROCODONE-ACETAMINOPHEN 5-325 MG PO TABS: 1.0000 | ORAL_TABLET | Freq: Four times a day (QID) | ORAL | Status: DC | PRN

## 2013-09-09 NOTE — Discharge Planning (Addendum)
At 0800 copy of AVS and rx to client who verbalized understanding. Able to verbalize drain and dressing care, supplies given to client.  Will discharge after breakfast, all questions answered. Dc/d amb to private car home with all personal belongings, acomp. By daughter. At 0930

## 2013-09-09 NOTE — Telephone Encounter (Signed)
Pt given benign pathology results, and upcoming appt with Dr. Dalbert Batman on 09/20/13 was confirmed.

## 2013-09-09 NOTE — Progress Notes (Signed)
Quick Note:  Inform patient of Pathology report,. Tell her that she has stage I cancer. The tumor was 1.7 cm diameter. All the lymph nodes are negative. Resection margins are clear. This is good news.  hmi ______

## 2013-09-09 NOTE — Telephone Encounter (Signed)
LMOV pt's po appt with Dr. Dalbert Batman is 09/20/13 at 2:45 p.m.

## 2013-09-09 NOTE — Discharge Instructions (Signed)
See above

## 2013-09-09 NOTE — Discharge Summary (Signed)
Patient ID: Wendy [name] [id number] y.o. 03/24/1946  Admit date: 09/07/2013  Discharge date and time: 09/09/2013  Admitting Physician: [name]  Discharge Physician: [name]  Admission Diagnoses: right breast cancer  Discharge Diagnoses: same, final pathology pending  Operations: Procedure(s): BILATERAL MASTECTOMY WITH RIGHT SENTINEL LYMPH NODE BIOPSY INSERTION PORT-A-CATH  Admission Condition: good  Discharged Condition: good  Indication for Admission: She was referred to me by Dr. Nicki Reaper at [facility] family practice in [place], and by Dr. Conchita Paris at [facility] radiology for evaluation of a newly diagnosed invasive ductal carcinoma of the right breast at the 3:00 position.  The patient has no prior history of breast problems. She gets annual mammograms. Recent screening mammograms revealed an abnormality in the right breast at the 3:00 position. She had a diagnostic right breast mammogram and ultrasound Which shows a 1.5 cm mass in the right breast at 3:00 position. Image guided biopsy of the right breast at 3:00 position reveals invasive ductal carcinoma and some DCIS. 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 Ki-67 13%.  Family history is negative for breast or ovarian cancer. It is positive for colon and bladder cancer.  Subsequent MRI shows a 1.5 cm mass in the right breast at 3:00 position. There is nodular enhancement extending posteriorly 6.1 cm from the primary lesion and the second area of enhancement in the lower outer quadrant. The left breast and all of the lymph node bearing areas are normal.  She has seen Dr. Marcy Panning who has recommended adjuvant chemotherapy and a Port-A-Cath. She also has undergone genetic counseling.  The patient decided that she desired bilateral mastectomies and she is also willing to undergo the Port-A-Cath insertion.She declines any consideration of reconstruction at this time. She declined any further biopsies. She  developed bronchitis which delayed her surgery about 2 weeks.Larry Sierras.    Hospital Course: On the day of admission the patient was taken to the operating room. She underwent Port-A-Cath insertion to the right subclavian route. She underwent bilateral total mastectomies and right axillary sentinel node biopsy. Pathology is pending at the time of discharge.      The patient did well postop. On postop day 1 she was beginning to mobilize. Pain is under control and wounds looked good. Over the next 24 hours she progressed in her diet and activities and felt ready to go home on postop day 2. At the time of discharge her bilateral mastectomy skin flaps looked very good. Good vascularity. No necrosis. No hematoma or fluid collections. All drains were functioning and draining low-volume serosanguineous fluid.           She was given instruction in diet, activities, drink urine record keeping. She's about that we will call the pathology report to her. She was given a prescription for hydrocodone for pain. She was asked to return to see me in the office in 10-12 days for wound and drain check.  Consults: None  Significant Diagnostic Studies: Surgical pathology, pending  Treatments: surgery: Port-A-Cath insertion, bilateral total mastectomy, right axillary sentinel node biopsy  Disposition: Home  Patient Instructions:    Medication List    ASK your doctor about these medications       budesonide-formoterol 160-4.5 MCG/ACT inhaler  Commonly known as:  SYMBICORT  Inhale 2 puffs into the lungs 2 (two) times daily.     CALCIUM PLUS VITAMIN D PO  Take 1 tablet by mouth 2 (two) times daily.     UNABLE  TO FIND  - X9024 Post mastectomy camisole QTY: 2  -   - Diagnosis Code 174.9        Activity: ambulation encouraged. Shoulder range of motion discussed. Sponge bathe. No shower. Do not drive a car Diet: low fat, low cholesterol diet Wound Care: as directed  Follow-up:  With Dr. Dalbert Batman in 10-12  days.  Signed: Edsel Petrin. Dalbert Batman, M.D., FACS General and minimally invasive surgery Breast and Colorectal Surgery  09/09/2013, 5:21 AM

## 2013-09-13 ENCOUNTER — Telehealth (INDEPENDENT_AMBULATORY_CARE_PROVIDER_SITE_OTHER): Payer: Self-pay

## 2013-09-13 NOTE — Telephone Encounter (Signed)
Patient has 4 drains that are less than 30 cc x 3 days , we need a nurse order to removed if its ok to do so. Please advise

## 2013-09-13 NOTE — Telephone Encounter (Signed)
Pt's daughter reports that there is no swelling or tenderness at surgical sites, but the two right drains will not stay deflated for any more than 10 minutes to one hour.  Please advise.

## 2013-09-14 ENCOUNTER — Encounter (INDEPENDENT_AMBULATORY_CARE_PROVIDER_SITE_OTHER): Payer: Self-pay | Admitting: General Surgery

## 2013-09-14 ENCOUNTER — Encounter (INDEPENDENT_AMBULATORY_CARE_PROVIDER_SITE_OTHER): Payer: Commercial Managed Care - HMO | Admitting: General Surgery

## 2013-09-14 ENCOUNTER — Ambulatory Visit (INDEPENDENT_AMBULATORY_CARE_PROVIDER_SITE_OTHER): Payer: Commercial Managed Care - HMO | Admitting: General Surgery

## 2013-09-14 VITALS — BP 118/76 | HR 72 | Resp 14 | Ht 66.5 in | Wt 164.8 lb

## 2013-09-14 DIAGNOSIS — C50219 Malignant neoplasm of upper-inner quadrant of unspecified female breast: Secondary | ICD-10-CM

## 2013-09-14 DIAGNOSIS — C50211 Malignant neoplasm of upper-inner quadrant of right female breast: Secondary | ICD-10-CM

## 2013-09-14 NOTE — Patient Instructions (Signed)
3 of the 4 drains are working perfectly. One of the drains has a slow leak and we changed the bulb. I cannot find any other area of leak. If it continues to lose a charge, then simply recharge as frequently as possible.  Keep a record of the drainage  Return to see me on Monday, April 20

## 2013-09-14 NOTE — Telephone Encounter (Signed)
If drains will not  Stay charged, then patient will need to come to the office today for a wound check.   HMI

## 2013-09-14 NOTE — Progress Notes (Signed)
Patient ID: Wendy [name], female   DOB: 1946-02-27, 68 y.o.   MRN: [id number] History: This patient underwent a bilateral total mastectomy and right sentinel lymph node biopsy of 09/07/2013. 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 She came in early because she states that the drainage or not holding in charge. She states she is feeling fine. He has no pain. Has been walking around walk everyday.  Exam: Patient is here with her daughter. She is in no distress. Bilateral mastectomy skin flaps looked great. Tissue soft and healthy. No hematoma or seroma. Seem to be sticking down to the chest wall well. Both drains on the left side discharge, observed for 30 minutes. The axillary drain on the right holds a charge for 30 minutes,but the skin flap drain on the right slowly leaks after about 15 minutes. I can see no defect in the skin, skin incision, or tubing. I changed the JP bulb thinking that it might be leaking around there. Drain sites on the right were redressed with Vaseline gauze and tape.  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 Dalbert Batman on April 20   Glynn Yepes M. Dalbert Batman, M.D., [facility] General and Minimally invasive Surgery Breast and Colorectal Surgery Office:   [phone] Pager:   [phone]

## 2013-09-15 ENCOUNTER — Encounter (INDEPENDENT_AMBULATORY_CARE_PROVIDER_SITE_OTHER): Payer: Self-pay

## 2013-09-16 NOTE — Progress Notes (Signed)
[place]  - form rcvd.  Completed and MD signature obtained.  Returned to [employer].

## 2013-09-20 ENCOUNTER — Encounter (INDEPENDENT_AMBULATORY_CARE_PROVIDER_SITE_OTHER): Payer: Self-pay | Admitting: General Surgery

## 2013-09-20 ENCOUNTER — Ambulatory Visit (INDEPENDENT_AMBULATORY_CARE_PROVIDER_SITE_OTHER): Payer: Commercial Managed Care - HMO | Admitting: General Surgery

## 2013-09-20 ENCOUNTER — Other Ambulatory Visit (INDEPENDENT_AMBULATORY_CARE_PROVIDER_SITE_OTHER): Payer: Self-pay | Admitting: *Deleted

## 2013-09-20 ENCOUNTER — Encounter (INDEPENDENT_AMBULATORY_CARE_PROVIDER_SITE_OTHER): Payer: Self-pay

## 2013-09-20 VITALS — BP 124/74 | HR 68 | Temp 97.5°F | Ht 66.0 in | Wt 163.6 lb

## 2013-09-20 DIAGNOSIS — C50211 Malignant neoplasm of upper-inner quadrant of right female breast: Secondary | ICD-10-CM

## 2013-09-20 DIAGNOSIS — C50219 Malignant neoplasm of upper-inner quadrant of unspecified female breast: Secondary | ICD-10-CM

## 2013-09-20 NOTE — Patient Instructions (Signed)
Your bilateral mastectomy incisions are healing well. We removed all of the drains today.  You may take a shower starting tomorrow. Do not take any tub baths for 2 more weeks.  You'll be referred to physical therapy to work on range of motion of your shoulders.  You can probably drive a car in about one week.  Return to see Dr. Dalbert Batman in 6 weeks, sooner if there are any problems.

## 2013-09-20 NOTE — Progress Notes (Signed)
Patient ID: Wendy [name], female   DOB: [birth year], 68 y.o.   MRN: [id number]  History:  This patient underwent a bilateral total mastectomy and right sentinel lymph node biopsy on 09/07/2013.  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 Stage TI C., N0.  All of the drains are draining 10 cc per day or less.  She states she is feeling fine. He has no pain. Has been walking around walk everyday.   Exam:  Patient is here with her husband. She is in no distress.  Bilateral mastectomy skin flaps looked great. Flaps are pink. . Adherent to chest wall. All 4 drains removed and drain sites dressed. Range of motion of the shoulders is pretty good, probably 140 abduction. No sensory deficit under either arm. .  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 Marcy Panning on April 27.   Edsel Petrin. Dalbert Batman, M.D., [facility]  General and Minimally invasive Surgery  Breast and Colorectal Surgery  Office: [phone]  Pager: [phone]

## 2013-09-21 ENCOUNTER — Telehealth: Payer: Self-pay | Admitting: Oncology

## 2013-09-21 NOTE — Telephone Encounter (Signed)
, °

## 2013-09-22 ENCOUNTER — Telehealth: Payer: Self-pay | Admitting: *Deleted

## 2013-09-22 NOTE — Telephone Encounter (Signed)
No new notes, insurance/care plan review

## 2013-09-23 ENCOUNTER — Other Ambulatory Visit: Payer: Self-pay | Admitting: Oncology

## 2013-09-23 ENCOUNTER — Telehealth: Payer: Self-pay | Admitting: *Deleted

## 2013-09-23 DIAGNOSIS — C50919 Malignant neoplasm of unspecified site of unspecified female breast: Secondary | ICD-10-CM

## 2013-09-23 MED ORDER — LIDOCAINE-PRILOCAINE 2.5-2.5 % EX CREA: 1.0000 "application " | TOPICAL_CREAM | CUTANEOUS | Status: DC | PRN

## 2013-09-23 NOTE — Telephone Encounter (Signed)
Called patient to inform her that she will be seeing Charlestine Massed on Monday and that treatment plan will be adjusted to [facility] q3weeks due to insurance denial of q2weeks. Daughter Berline Chough requesting cream for EMLA to be sent to pharmacy, will send to [facility]. Date of first chemo 4/27, will be 3 weeks from surgery. Patient is feeling great, routine exercising and healing wonderfully. She states she is ready to begin on Monday!

## 2013-09-23 NOTE — Progress Notes (Signed)
Medical Oncology  Patient's insurance will not cover q 2 week FEC.  In Dr Laurelyn Sickle absence, and at request of [facility] administration, this MD reviewed NCCN guidelines, discussed with Dr Jana Hakim and requested/reviewed additional information from [facility] pharmacist.  Recommendation is to change initial regimen to q 3 week FEC. RN reviewed with patient and daughter by phone, and they are in agreement.  Chemotherapy orders adjusted now. Patient is to see APP prior to start of treatment. Message to RN to confirm antiemetic prescriptions. Consent to be signed prior to start of first treatment.  Godfrey Pick, MD

## 2013-09-24 ENCOUNTER — Encounter (INDEPENDENT_AMBULATORY_CARE_PROVIDER_SITE_OTHER): Payer: Self-pay | Admitting: Surgery

## 2013-09-24 ENCOUNTER — Other Ambulatory Visit: Payer: Self-pay | Admitting: *Deleted

## 2013-09-24 ENCOUNTER — Ambulatory Visit (INDEPENDENT_AMBULATORY_CARE_PROVIDER_SITE_OTHER): Payer: Commercial Managed Care - HMO | Admitting: Surgery

## 2013-09-24 ENCOUNTER — Telehealth (INDEPENDENT_AMBULATORY_CARE_PROVIDER_SITE_OTHER): Payer: Self-pay

## 2013-09-24 VITALS — BP 104/62 | HR 70 | Temp 97.2°F | Resp 14 | Wt 162.0 lb

## 2013-09-24 DIAGNOSIS — C50211 Malignant neoplasm of upper-inner quadrant of right female breast: Secondary | ICD-10-CM

## 2013-09-24 DIAGNOSIS — C50219 Malignant neoplasm of upper-inner quadrant of unspecified female breast: Secondary | ICD-10-CM

## 2013-09-24 MED ORDER — PROCHLORPERAZINE MALEATE 10 MG PO TABS: 10.0000 mg | ORAL_TABLET | Freq: Four times a day (QID) | ORAL | Status: DC | PRN

## 2013-09-24 MED ORDER — ONDANSETRON HCL 8 MG PO TABS: 8.0000 mg | ORAL_TABLET | Freq: Two times a day (BID) | ORAL | Status: DC | PRN

## 2013-09-24 MED ORDER — LORAZEPAM 0.5 MG PO TABS: 0.5000 mg | ORAL_TABLET | Freq: Four times a day (QID) | ORAL | Status: DC | PRN

## 2013-09-24 MED ORDER — DEXAMETHASONE 4 MG PO TABS: ORAL_TABLET | ORAL | Status: DC

## 2013-09-24 NOTE — Telephone Encounter (Signed)
Patient states left breast is swollen , denies tenderness or pain. ? fluid collection . Appt in urg @ 330p with Dr. Johney Maine per DR Dalbert Batman. Patient aware

## 2013-09-24 NOTE — Progress Notes (Signed)
This RN e-scribed four prescriptions to patient's pharmacy to pick up before starting chemotherapy on Monday, 09/27/13. Patient verbalized understanding.

## 2013-09-24 NOTE — Patient Instructions (Signed)
[facility] [facility] [phone]  MASTECTOMY: POST OP INSTRUCTIONS  Always review your discharge instruction sheet given to you by the facility where your surgery was performed. IF YOU HAVE DISABILITY OR FAMILY LEAVE FORMS, YOU MUST BRING THEM TO THE OFFICE FOR PROCESSING.   DO NOT GIVE THEM TO YOUR DOCTOR. A prescription for pain medication may be given to you upon discharge.  Take your pain medication as prescribed, if needed.  If narcotic pain medicine is not needed, then you may take acetaminophen (Tylenol) or ibuprofen (Advil) as needed. 1. Take your usually prescribed medications unless otherwise directed. 2. If you need a refill on your pain medication, please contact your pharmacy.  They will contact our office to request authorization.  Prescriptions will not be filled after 5pm or on week-ends. 3. You should follow a light diet the first few days after arrival home, such as soup and crackers, etc.  Resume your normal diet the day after surgery. 4. Most patients will experience some swelling and bruising on the chest and underarm.  Ice packs will help.  Swelling and bruising can take several days to resolve.  5. It is common to experience some constipation if taking pain medication after surgery.  Increasing fluid intake and taking a stool softener (such as Colace) will usually help or prevent this problem from occurring.  A mild laxative (Milk of Magnesia or Miralax) should be taken according to package instructions if there are no bowel movements after 48 hours. 6. Unless discharge instructions indicate otherwise, leave your bandage dry and in place until your next appointment in 3-5 days.  You may take a limited sponge bath.  No tube baths or showers until the drains are removed.  You may have steri-strips (small skin tapes) in place directly over the incision.  These strips should be left on the skin for 7-10 days.  If your surgeon used skin glue on the incision, you may  shower in 24 hours.  The glue will flake off over the next 2-3 weeks.  Any sutures or staples will be removed at the office during your follow-up visit. 7. DRAINS:  If you have drains in place, it is important to keep a list of the amount of drainage produced each day in your drains.  Before leaving the hospital, you should be instructed on drain care.  Call our office if you have any questions about your drains. 8. ACTIVITIES:  You may resume regular (light) daily activities beginning the next day-such as daily self-care, walking, climbing stairs-gradually increasing activities as tolerated.  You may have sexual intercourse when it is comfortable.  Refrain from any heavy lifting or straining until approved by your doctor. a. You may drive when you are no longer taking prescription pain medication, you can comfortably wear a seatbelt, and you can safely maneuver your car and apply brakes. b. RETURN TO WORK:  __________________________________________________________ 9. You should see your doctor in the office for a follow-up appointment approximately 3-5 days after your surgery.  Your doctor's nurse will typically make your follow-up appointment when she calls you with your pathology report.  Expect your pathology report 2-3 business days after your surgery.  You may call to check if you do not hear from [place] after three days.   10. OTHER INSTRUCTIONS: ______________________________________________________________________________________________ ____________________________________________________________________________________________ WHEN TO CALL YOUR DOCTOR: 1. Fever over 101.0 2. Nausea and/or vomiting 3. Extreme swelling or bruising 4. Continued bleeding from incision. 5. Increased pain, redness, or drainage from the incision.  The clinic staff is available to answer your questions during regular business hours.  Please don't hesitate to call and ask to speak to one of the nurses for clinical  concerns.  If you have a medical emergency, go to the nearest emergency room or call 911.  A surgeon from [facility] is always on call at the hospital. [address] [place], [place], [place]  [address] ? [address] [place], [place], [place]   [address] [phone] ? [phone] ? FAX [phone] Web site: [email or web address]  Exercises Following Breast Surgery Following all surgeries on the breast or axillary nodes, whether you had radiation treatment or not, exercises may help you return to your normal activities and way of life sooner. Before beginning any exercise, talk to your caregiver about what type of exercises will be best for you. Your caregiver may recommend getting physical therapy to help you, especially if you do not see any progress in a month of exercising. Some light exercises can be done right after the surgery, but any drains and sutures will be removed before doing the extended or heavy exercises. Generally, the exercises will lessen problems following the surgery. You can usually expect to have full range of motion of your arm back in 4 to 6 weeks.  HOME CARE INSTRUCTIONS  These exercises should be done for the first 3 to 7 days after surgery, but only with your doctor's permission.   Use your affected arm (on the side where your surgery was) as you normally would when combing your hair, bathing, dressing and eating.  Raise your affected arm above the level of your heart for 45 minutes, 2 to 3 times a day, while lying down. Put your arm on pillows so that your hand is higher than your wrist and your elbow is a little higher than your shoulder. This will help decrease the swelling that may happen after surgery.  Exercise your affected arm while it is elevated above the level of your heart by opening and closing your hand 15 to 25 times. Then, bend and straighten your elbow. Repeat this 3 to 4 times a day. This exercise helps reduce swelling by pumping lymph fluid out of  your arm.  Practice deep breathing exercises (using your diaphragm) at least 6 times each day. While lying on your back, take a slow, deep breath. Breathe in as much air as you can while trying to expand your chest and abdomen (push your belly button away from your spine). Relax and breathe out. Repeat this 4 or 5 times. This exercise will help maintain normal movement of your chest, making it easier for your lungs to expand. Continue to do deep breathing exercises from now on.  Avoid sleeping on your affected arm or on that side.  Do not lift anything over 5 pounds.  Stop exercising if you develop pain in your chest, arm or shoulder, and call your caregiver.  Let your caregiver or therapist know if your arm becomes swollen after exercising.  Exercise in front of a mirror. This way you can see yourself exercising in a correct posture and using the correct motion that is recommended.  Do not use a heating pad on your arm of the side of the surgery. GENERAL GUIDELINES FOR EXERCISE The exercises described here can be done as soon as your doctor gives you permission. Be sure to talk to your doctor before trying any of them.   You will feel some tightness in your chest  and armpit after surgery. This is normal. The tightness will decrease as you continue your exercise program.  Many women have a burning, tingling, numbness, or soreness on the back of the arm and/or chest wall. This is because the surgery irritated some of your nerve endings. Although the sensations may increase a few weeks after surgery, continue to do the exercises unless you notice unusual swelling or tenderness. (Tell your caregiver if this occurs.) Sometimes rubbing or stroking the area with your hand or a soft cloth can help make the area less sensitive.  It may be helpful to do exercises after a warm shower when muscles are warm and relaxed.  Wear comfortable, loose clothing when doing the exercises.  Do the exercises until  you feel a slow stretch. Hold each stretch at the end of the motion for a count of five. It is normal to feel some pulling as you stretch the skin and muscles that have been shortened because of the surgery. Do not do bouncing or jerky-type movements when doing any of the exercises. You should not feel pain as you do the exercises, only gentle stretching.  Do 5 to 7 repetitions of each exercise. Try to do each exercise correctly. If you have difficulty with the exercises, contact your doctor. You may need to be referred to a physical or occupational therapist.  Exercises should be done twice a day until you regain normal flexibility and strength.  Be sure to take deep breaths, in and out, as you perform each exercise.  The exercises are designed so that you begin them lying down, move to sitting, and then finish standing. EXERCISES IN LYING POSITION These exercises should be performed on a bed or on the floor while lying on your back. Keep your knees and hips bent and feet flat.  Wand Exercise This exercise helps increase the forward motion of the shoulders. You will need a broom handle, yardstick, or other similar object to perform this exercise.   Hold the wand in both hands with palms facing up.  Lift the wand up over your head (as far as you can) using your unaffected arm to help lift the wand, until you feel a stretch in your affected arm.  Hold for five seconds.  Lower arms and repeat 5 to 7 times. Elbow Winging This exercise helps increase the mobility of the front of your chest and shoulder. It may take several weeks of regular exercise before your elbows will get close to the bed (or floor).   Clasp your hands behind your neck with your elbows pointing toward the ceiling.  Move your elbows apart and down toward the bed (or floor).  Repeat 5 to 7 times. EXERCISES IN SITTING POSITION Shoulder Blade Stretch This exercise helps increase the mobility of the shoulder blades.   Sit  in a chair very close to a table.  Place the unaffected arm on the table with your elbow bent and palm down. Do not move this arm during the exercise.  Place the affected arm on the table, palm down with your elbow straight.  Without moving your trunk, slide the affected arm toward the opposite side of the table. You should feel your shoulder blade move as you do this.  Relax your arm and repeat 5 to 7 times. Shoulder Blade Squeeze This exercise also helps increase the mobility of the shoulder blade.   Facing straight ahead, sit in a chair in front of a mirror without resting on the back  of the chair.  Arms should be at your sides with elbows bent.  Squeeze shoulder blades together, bringing your elbows behind you. Keep your shoulders level as you do this exercise. Do not lift your shoulders up toward your ears.  Return to the starting position and repeat 5 to 7 times. Side Bending This exercise helps increase the mobility of the trunk/body.   Clasp your hands together in front of you and lift your arms slowly over your head, straightening your arms.  When your arms are over your head, bend your trunk to the right while bending at the waist and keeping your arms overhead.  Return to the starting position and bend to the left.  Repeat 5 to 7 times. EXERCISES IN STANDING POSITION Chest Wall Stretch This exercise helps stretch the chest wall.   Stand facing a corner with toes approximately 8 to 10 inches from the corner.  Bend your elbows and place forearms on the wall, one on each side of the corner. Your elbows should be as close to shoulder height as possible.  Keep your arms and feet in position and move your chest toward the corner. You will feel a stretch across your chest and shoulders.  Return to starting position and repeat 5 to 7 times. Shoulder Stretch This exercise helps increase the mobility in the shoulder.   Stand facing the wall with your toes approximately 8  to 10 inches from the wall.  Place your hands on the wall. Use your fingers to "climb the wall," reaching as high as you can until you feel a stretch.  Return to starting position and repeat 5 to 7 times. THINGS TO KEEP IN MIND  Begin exercising slowly and keep going as you are able. Stop exercising and call your caregiver if you:  Get weaker, start losing your balance or start falling.  Have pain that gets worse.  Have new heaviness in your arm.  Have unusual swelling, or swelling gets worse.  Have headaches, dizziness, blurred vision, new numbness or tingling in arms or chest. It is important to exercise to keep muscles working as well as possible, but it is also important to be safe. Talk with your caregiver about realistic exercises for your condition. Then set goals for increasing your physical activity level.  Document Released: 12/10/2005 Document Revised: 09/14/2012 Document Reviewed: 07/04/2008 [facility] Patient Information 2014 [place].

## 2013-09-24 NOTE — Telephone Encounter (Signed)
If Dr. Johney Maine cannot see her I will see at the end of my office.  hmi

## 2013-09-24 NOTE — Progress Notes (Signed)
Subjective:     Patient ID: Wendy [name], female   DOB: Jul 04, 1945, 68 y.o.   MRN: [id number]  HPI  Note: This dictation was prepared with Dragon/digital dictation along with [facility] technology. Any transcriptional errors that result from this process are unintentional.       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 Clinically stage I by mammography but may be much more extensive by MRI. Triple negative breast cancer.   Post op Diagnosis: Same   Procedure: Insertion of power port ClearView 8 French tunneled venous vascular access device  Use of fluoroscopy for guidance and positioning   Right total mastectomy  Right axillary sentinel node biopsy  Injection blue dye right breast   Left total mastectomy   Surgeon: Edsel Petrin. Dalbert Batman, M.D., FACS  Assistant: Stark Klein, MD     This patient returns for surgical re-evaluation.  She was concern about some increased swelling on the left chest wall after her mastectomy yesterday.  Seemed better this morning.  Just to be safe, we offered to see her today since it was Friday.  She comes today with her daughter who is a Marine scientist.  She denies much pain.  Did not need any narcotics.  No fevers or chills.  Was using the binder with the drains.  Has not worn the binder since her drains removed earlier this week.  No fevers or chills.  Using her arms more.  Claims to play tennis very irregularly and wonders when she can start that.  Was to hear from a physical therapist but has not been contacted yet.  She is due to start chemotherapy next week.  Patient Active Problem List   Diagnosis Date Noted  . Cancer of right breast 09/07/2013  . Breast cancer of upper-inner quadrant of right female breast 08/03/2013    Past Medical History  Diagnosis Date  . Family history of anesthesia  complication     "daughter PONV; long time in recovery" (09/07/2013)  . Asthma     as a child  . Asthmatic bronchitis     "about q yr" (09/07/2013)  . Pneumonia     "several times; usually follows asthmatic bronchitis" (09/07/2013)  . Sinus headache     "maybe 2-3 times/month"  . Breast cancer     "right breast"     Past Surgical History  Procedure Laterality Date  . Tubal ligation  1977  . Finger fracture surgery Left 2010    "middle finger spiral fx; little finger broken; tissue damage"  . Tonsillectomy    . Portacath placement Right 09/07/2013  . Mastectomy complete / simple w/ sentinel node biopsy Right 09/07/2013  . Mastectomy complete / simple  09/07/2013  . Breast biopsy Right 07/2013  . Dilation and curettage of uterus      S/P miscarriage  . Wrist fracture surgery Left 2010  . Mastectomy w/ sentinel node biopsy Bilateral 09/07/2013    Procedure: BILATERAL MASTECTOMY WITH RIGHT SENTINEL LYMPH NODE BIOPSY;  Surgeon: [name], MD;  Location: [facility];  Service: General;  Laterality: Bilateral;  . Portacath placement Right 09/07/2013    Procedure: INSERTION PORT-A-CATH;  Surgeon: [name], MD;  Location: [place];  Service: General;  Laterality: Right;    History   Social History  . Marital Status: Married    Spouse  Name: N/A    Number of Children: 2  . Years of Education: N/A   Occupational History  . Not on file.   Social History Main Topics  . Smoking status: Never Smoker   . Smokeless tobacco: Never Used  . Alcohol Use: No  . Drug Use: No  . Sexual Activity: No   Other Topics Concern  . Not on file   Social History Narrative  . No narrative on file    Family History  Problem Relation Age of Onset  . Bladder Cancer Sister 58    worked in [employer], around 2nd hand smoke all her life  . Diabetes Sister   . Colon cancer Brother 30    non-smoker  . Diabetes Brother   . Colon cancer Maternal Uncle 83  . Osteoporosis Mother   . Parkinson's disease Mother    . Diabetes Mother   . Rheum arthritis Father   . Diabetes Father   . Breast cancer Paternal Aunt     dx over 5  . Diabetes Maternal Grandmother   . Diabetes Maternal Grandfather   . Uterine cancer Other 50  . Colon cancer Maternal Aunt     dx in her 100s-70s  . Breast cancer Cousin     paternal cousin dx in her 57s-70s    Current Outpatient Prescriptions  Medication Sig Dispense Refill  . Calcium Carbonate-Vitamin D (CALCIUM PLUS VITAMIN D PO) Take 1 tablet by mouth 2 (two) times daily.      Marland Kitchen dexamethasone (DECADRON) 4 MG tablet Take 2 tablets by mouth once a day on the day after chemotherapy and then take 2 tablets two times a day for 2 days. Take with food.  30 tablet  1  . lidocaine-prilocaine (EMLA) cream Apply 1 application topically as needed.  30 g  1  . LORazepam (ATIVAN) 0.5 MG tablet Take 1 tablet (0.5 mg total) by mouth every 6 (six) hours as needed (Nausea or vomiting).  30 tablet  0  . ondansetron (ZOFRAN) 8 MG tablet Take 1 tablet (8 mg total) by mouth 2 (two) times daily as needed. Start on the third day after chemotherapy.  30 tablet  1  . prochlorperazine (COMPAZINE) 10 MG tablet Take 1 tablet (10 mg total) by mouth every 6 (six) hours as needed (Nausea or vomiting).  30 tablet  1  . UNABLE TO FIND [id number] Post mastectomy camisole QTY: 2  Diagnosis Code 174.9  2 each  3   No current facility-administered medications for this visit.     No Known Allergies  BP 104/62  Pulse 70  Temp(Src) 97.2 F (36.2 C) (Temporal)  Resp 14  Wt 162 lb (73.483 kg)  Chest 2 View  08/31/2013   CLINICAL DATA:  Preop breast cancer surgery  EXAM: CHEST  2 VIEW  COMPARISON:  CT CHEST W/CM dated 08/19/2013  FINDINGS: The heart size and mediastinal contours are within normal limits. Biapical pleural scarring otherwise both lungs are clear. The visualized skeletal structures are unremarkable.  IMPRESSION: No active cardiopulmonary disease.   Electronically Signed   By: Margaree Mackintosh M.D.    On: 08/31/2013 10:33   Nm Sentinel Node Inj-no Rpt (breast)  09/07/2013   CLINICAL DATA: Cancer right breast   Sulfur colloid was injected intradermally by the nuclear medicine  technologist for breast cancer sentinel node localization.    Dg Chest Port 1 View  09/07/2013   CLINICAL DATA:  Port-A-Cath placement  EXAM: PORTABLE CHEST - 1  VIEW  COMPARISON:  Chest radiograph 08/31/2013  FINDINGS: A right subclavian power Port-A-Cath has been placed. The distal tip is projects over the distal superior vena cava. Negative for pneumothorax. There is streaky left basilar atelectasis. Otherwise the lungs are clear. The heart and mediastinal contours are stable. There are postsurgical changes of bilateral mastectomy with soft tissue drains in place. Small amounts of subcutaneous gas project over the lateral chest walls bilaterally.  IMPRESSION: 1. Satisfactory position of right subclavian Port-A-Cath. 2. Negative for pneumothorax. 3. Postsurgical changes of bilateral mastectomies.   Electronically Signed   By: Curlene Dolphin M.D.   On: 09/07/2013 13:41   Dg Fluoro Guide Cv Line-no Report  09/07/2013   CLINICAL DATA: BREAST CANCER   FLOURO GUIDE CV LINE  Fluoroscopy was utilized by the requesting physician.  No radiographic  interpretation.      Review of Systems  Constitutional: Negative for fever, chills, diaphoresis, activity change and fatigue.  HENT: Negative for ear pain, sore throat and trouble swallowing.   Eyes: Negative for photophobia and visual disturbance.  Respiratory: Negative for cough, choking, chest tightness and shortness of breath.   Cardiovascular: Negative for chest pain and palpitations.  Gastrointestinal: Negative for nausea, vomiting, abdominal pain, diarrhea, constipation, anal bleeding and rectal pain.  Genitourinary: Negative for dysuria, frequency and difficulty urinating.  Musculoskeletal: Negative for gait problem and myalgias.  Skin: Negative for color change, pallor, rash  and wound.  Neurological: Negative for dizziness, speech difficulty, weakness and numbness.  Hematological: Negative for adenopathy.  Psychiatric/Behavioral: Negative for confusion and agitation. The patient is not nervous/anxious.        Objective:   Physical Exam  Constitutional: She is oriented to person, place, and time. She appears well-developed and well-nourished. No distress.  HENT:  Head: Normocephalic.  Mouth/Throat: Oropharynx is clear and moist. No oropharyngeal exudate.  Eyes: Conjunctivae and EOM are normal. Pupils are equal, round, and reactive to light. No scleral icterus.  Neck: Normal range of motion. No tracheal deviation present.  Cardiovascular: Normal rate and intact distal pulses.   Pulmonary/Chest: Effort normal. No respiratory distress. She exhibits no tenderness.    Mastectomy sites & portacath incision well-healed.  Mild soft folds of adipose tissue on lateral skin flaps.  No cellulitis.  No warmth.  No erythema.  No pain.  No drainage.  Abdominal: Soft. She exhibits no distension. There is no tenderness. Hernia confirmed negative in the right inguinal area and confirmed negative in the left inguinal area.  Genitourinary: No vaginal discharge found.  Musculoskeletal: Normal range of motion. She exhibits no tenderness.  Lymphadenopathy:       Right: No inguinal adenopathy present.       Left: No inguinal adenopathy present.  Neurological: She is alert and oriented to person, place, and time. No cranial nerve deficit. She exhibits normal muscle tone. Coordination normal.  Skin: Skin is warm and dry. No rash noted. She is not diaphoretic.  Psychiatric: She has a normal mood and affect. Her behavior is normal.       Assessment:     3 weeks status post bilateral mastectomies recovering well.  No evidence of seroma nor cellulitis.     Plan:     Increase activity as tolerated to regular activity.  Hold off on aggressive [place] such as playing tennis and  totally 6 weeks, possibly longer.  Apparently physical therapy is working on trying to set up an appointment but is not reach her yet.  I asked them  to call us if they had not heard anything by next week.  The daughter is a Marine scientist and the father is a therapist, so they feel she is progressing well.  Instruction sheets on post maxillectomy arm exercises given as well.  Low impact exercise such as walking an hour a day at least ideal.  Do not push through pain.  Diet as tolerated.  Low fat high fiber diet ideal.  Bowel regimen with 30 g fiber a day and fiber supplement as needed to avoid problems.  Return to clinic in 6 weeks, sooner as needed.   Instructions discussed.  Followup with primary care physician for other health issues as would normally be done.  Consider screening for malignancies (colon, melanoma, etc) as appropriate.  Questions answered.  The patient & daughter expressed understanding and appreciation  I also discussed with the operating surgeon, Dr. Dalbert Batman.

## 2013-09-27 ENCOUNTER — Encounter: Payer: Self-pay | Admitting: Adult Health

## 2013-09-27 ENCOUNTER — Ambulatory Visit (HOSPITAL_BASED_OUTPATIENT_CLINIC_OR_DEPARTMENT_OTHER): Payer: Commercial Managed Care - HMO

## 2013-09-27 ENCOUNTER — Ambulatory Visit (HOSPITAL_BASED_OUTPATIENT_CLINIC_OR_DEPARTMENT_OTHER): Payer: Commercial Managed Care - HMO | Admitting: Adult Health

## 2013-09-27 ENCOUNTER — Other Ambulatory Visit (HOSPITAL_BASED_OUTPATIENT_CLINIC_OR_DEPARTMENT_OTHER): Payer: Medicare HMO

## 2013-09-27 VITALS — BP 116/74 | HR 72 | Temp 98.1°F | Resp 20 | Ht 66.0 in | Wt 163.7 lb

## 2013-09-27 DIAGNOSIS — Z171 Estrogen receptor negative status [ER-]: Secondary | ICD-10-CM

## 2013-09-27 DIAGNOSIS — C50219 Malignant neoplasm of upper-inner quadrant of unspecified female breast: Secondary | ICD-10-CM

## 2013-09-27 DIAGNOSIS — C50211 Malignant neoplasm of upper-inner quadrant of right female breast: Secondary | ICD-10-CM

## 2013-09-27 DIAGNOSIS — C50919 Malignant neoplasm of unspecified site of unspecified female breast: Secondary | ICD-10-CM

## 2013-09-27 DIAGNOSIS — Z5111 Encounter for antineoplastic chemotherapy: Secondary | ICD-10-CM

## 2013-09-27 DIAGNOSIS — Z9010 Acquired absence of unspecified breast and nipple: Secondary | ICD-10-CM

## 2013-09-27 LAB — COMPREHENSIVE METABOLIC PANEL (CC13)
ALT: 12 U/L (ref 0–55)
AST: 18 U/L (ref 5–34)
Albumin: 3.6 g/dL (ref 3.5–5.0)
Alkaline Phosphatase: 64 U/L (ref 40–150)
Anion Gap: 9 mEq/L (ref 3–11)
BUN: 14.4 mg/dL (ref 7.0–26.0)
CHLORIDE: 108 meq/L (ref 98–109)
CO2: 26 meq/L (ref 22–29)
Calcium: 9.5 mg/dL (ref 8.4–10.4)
Creatinine: 0.9 mg/dL (ref 0.6–1.1)
Glucose: 93 mg/dl (ref 70–140)
Potassium: 4.2 mEq/L (ref 3.5–5.1)
SODIUM: 143 meq/L (ref 136–145)
TOTAL PROTEIN: 6.7 g/dL (ref 6.4–8.3)
Total Bilirubin: 0.24 mg/dL (ref 0.20–1.20)

## 2013-09-27 LAB — CBC WITH DIFFERENTIAL/PLATELET
BASO%: 0.4 % (ref 0.0–2.0)
Basophils Absolute: 0 10*3/uL (ref 0.0–0.1)
EOS%: 2.6 % (ref 0.0–7.0)
Eosinophils Absolute: 0.1 10*3/uL (ref 0.0–0.5)
HCT: 37.6 % (ref 34.8–46.6)
HGB: 12.6 g/dL (ref 11.6–15.9)
LYMPH#: 1.4 10*3/uL (ref 0.9–3.3)
LYMPH%: 30 % (ref 14.0–49.7)
MCH: 30.2 pg (ref 25.1–34.0)
MCHC: 33.5 g/dL (ref 31.5–36.0)
MCV: 90.2 fL (ref 79.5–101.0)
MONO#: 0.4 10*3/uL (ref 0.1–0.9)
MONO%: 8.4 % (ref 0.0–14.0)
NEUT#: 2.7 10*3/uL (ref 1.5–6.5)
NEUT%: 58.6 % (ref 38.4–76.8)
Platelets: 239 10*3/uL (ref 145–400)
RBC: 4.17 10*6/uL (ref 3.70–5.45)
RDW: 15.6 % — ABNORMAL HIGH (ref 11.2–14.5)
WBC: 4.7 10*3/uL (ref 3.9–10.3)

## 2013-09-27 MED ORDER — EPIRUBICIN HCL CHEMO IV INJECTION 200 MG/100ML
100.0000 mg/m2 | Freq: Once | INTRAVENOUS | Status: AC
  Administered 2013-09-27: 186 mg via INTRAVENOUS
  Filled 2013-09-27: qty 93

## 2013-09-27 MED ORDER — DEXAMETHASONE SODIUM PHOSPHATE 20 MG/5ML IJ SOLN
12.0000 mg | Freq: Once | INTRAMUSCULAR | Status: AC
  Administered 2013-09-27: 12 mg via INTRAVENOUS

## 2013-09-27 MED ORDER — SODIUM CHLORIDE 0.9 % IV SOLN
500.0000 mg/m2 | Freq: Once | INTRAVENOUS | Status: AC
  Administered 2013-09-27: 940 mg via INTRAVENOUS
  Filled 2013-09-27: qty 47

## 2013-09-27 MED ORDER — SODIUM CHLORIDE 0.9 % IV SOLN
150.0000 mg | Freq: Once | INTRAVENOUS | Status: AC
  Administered 2013-09-27: 150 mg via INTRAVENOUS
  Filled 2013-09-27: qty 5

## 2013-09-27 MED ORDER — SODIUM CHLORIDE 0.9 % IJ SOLN
10.0000 mL | INTRAMUSCULAR | Status: DC | PRN
  Administered 2013-09-27: 10 mL
  Filled 2013-09-27: qty 10

## 2013-09-27 MED ORDER — PALONOSETRON HCL INJECTION 0.25 MG/5ML
0.2500 mg | Freq: Once | INTRAVENOUS | Status: AC
  Administered 2013-09-27: 0.25 mg via INTRAVENOUS

## 2013-09-27 MED ORDER — PALONOSETRON HCL INJECTION 0.25 MG/5ML
INTRAVENOUS | Status: AC
  Filled 2013-09-27: qty 5

## 2013-09-27 MED ORDER — FLUOROURACIL CHEMO INJECTION 2.5 GM/50ML
500.0000 mg/m2 | Freq: Once | INTRAVENOUS | Status: AC
  Administered 2013-09-27: 950 mg via INTRAVENOUS
  Filled 2013-09-27: qty 19

## 2013-09-27 MED ORDER — DEXAMETHASONE SODIUM PHOSPHATE 20 MG/5ML IJ SOLN
INTRAMUSCULAR | Status: AC
  Filled 2013-09-27: qty 5

## 2013-09-27 MED ORDER — SODIUM CHLORIDE 0.9 % IV SOLN
Freq: Once | INTRAVENOUS | Status: AC
  Administered 2013-09-27: 15:00:00 via INTRAVENOUS

## 2013-09-27 MED ORDER — HEPARIN SOD (PORK) LOCK FLUSH 100 UNIT/ML IV SOLN
500.0000 [IU] | Freq: Once | INTRAVENOUS | Status: AC | PRN
  Administered 2013-09-27: 500 [IU]
  Filled 2013-09-27: qty 5

## 2013-09-27 NOTE — Patient Instructions (Addendum)
Nausea medication  Decadron/Dexamethasone: take two tablets once the day after chemotherapy, then take two tablets twice a day for two days.    Ondansetron/Zofran: take twice a day as needed starting the third day after chemotherapy  Prochlorperazine/Compazine: Take every 6 hours as needed for nausea  Lorazepam/Ativan: Take every 6 hours as needed for nausea  You will receive Neulasta tomorrow.  I recommended you take Claritin 10mg  daily and Tylenol, either 650mg  or 500mg  daily starting tomorrow for 5 days.    Take super B complex 1 tablet daily.    Epirubicin injection What is this medicine? EPIRUBICIN (ep i ROO bi sin) is a chemotherapy drug. This medicine is used to treat breast cancer. This medicine may be used for other purposes; ask your health care provider or pharmacist if you have questions. COMMON BRAND NAME(S): Ellence What should I tell my health care provider before I take this medicine? They need to know if you have any of these conditions: -blood disorders -heart disease, recent heart attack -infection (especially a virus infection such as chickenpox, cold sores, or herpes) -irregular heartbeat -kidney disease -liver disease -recent or ongoing radiation therapy -an unusual or allergic reaction to epirubicin, other chemotherapy agents, other medicines, foods, dyes, or preservatives -pregnant or trying to get pregnant -breast-feeding How should I use this medicine? This drug is given as an infusion into a vein. It is administered in a hospital or clinic by a specially trained health care professional. If you have pain, swelling, burning or any unusual feeling around the site of your injection, tell your health care professional right away. Talk to your pediatrician regarding the use of this medicine in children. Special care may be needed. Overdosage: If you think you have taken too much of this medicine contact a poison control center or emergency room at once. NOTE:  This medicine is only for you. Do not share this medicine with others. What if I miss a dose? It is important not to miss your dose. Call your doctor or health care professional if you are unable to keep an appointment. What may interact with this medicine? Do not take this medicine with any of the following medications: -cisapride -droperidol -halofantrine -pimozide This medicine may also interact with the following medications: -chloroquine -chlorpromazine -clarithromycin -cimetidine -cyclosporine -erythromycin -medicines for blood pressure like amlodipine, felodipine, nifedipine -medicines for depression, anxiety, or psychotic disturbances -medicines for irregular heart beat like amiodarone, bepridil, dofetilide, encainide, flecainide, propafenone, quinidine -medicines for nausea, vomiting like dolasetron, ondansetron, palonosetron -medicines to increase blood counts like filgrastim, pegfilgrastim, sargramostim -methadone -methotrexate -pentamidine -vaccines Talk to your doctor or health care professional before taking any of these medicines: -acetaminophen -aspirin -ibuprofen -ketoprofen -naproxen This list may not describe all possible interactions. Give your health care provider a list of all the medicines, herbs, non-prescription drugs, or dietary supplements you use. Also tell them if you smoke, drink alcohol, or use illegal drugs. Some items may interact with your medicine. What should I watch for while using this medicine? Your condition will be monitored carefully while you are receiving this medicine. You will need important blood work done while you are taking this medicine. This drug may make you feel generally unwell. This is not uncommon, as chemotherapy can affect healthy cells as well as cancer cells. Report any side effects. Continue your course of treatment even though you feel ill unless your doctor tells you to stop. Your urine may turn red for a few days  after your dose. This  is not blood. If your urine is dark or brown, call your doctor. In some cases, you may be given additional medicines to help with side effects. Follow all directions for their use. Call your doctor or health care professional for advice if you get a fever, chills or sore throat, or other symptoms of a cold or flu. Do not treat yourself. This drug decreases your body's ability to fight infections. Try to avoid being around people who are sick. This medicine may increase your risk to bruise or bleed. Call your doctor or health care professional if you notice any unusual bleeding. Be careful brushing and flossing your teeth or using a toothpick because you may get an infection or bleed more easily. If you have any dental work done, tell your dentist you are receiving this medicine. Avoid taking products that contain aspirin, acetaminophen, ibuprofen, naproxen, or ketoprofen unless instructed by your doctor. These medicines may hide a fever. Men and women of childbearing age should use effective birth control methods while using taking this medicine. Do not become pregnant while taking this medicine. There is a potential for serious side effects to an unborn child. Talk to your health care professional or pharmacist for more information. Do not breast-feed an infant while taking this medicine. There is a maximum amount of this medicine you should receive throughout your life. The amount depends on the medical condition being treated and your overall health. Your doctor will watch how much of this medicine you receive in your lifetime. Tell your doctor if you have taken this medicine before. What side effects may I notice from receiving this medicine? Side effects that you should report to your doctor or health care professional as soon as possible: -allergic reactions like skin rash, itching or hives, swelling of the face, lips, or tongue -low blood counts - this medicine may decrease  the number of white blood cells, red blood cells and platelets. You may be at increased risk for infections and bleeding. -signs of infection - fever or chills, cough, sore throat, pain or difficulty passing urine -signs of decreased platelets or bleeding - bruising, pinpoint red spots on the skin, black, tarry stools, blood in the urine -signs of decreased red blood cells - unusually weak or tired, fainting spells, lightheadedness -breathing problems -chest pain -gout pain -fast, irregular heartbeat -mouth sores -pain, swelling, redness at site where injected -swelling of ankles, feet, or hands Side effects that usually do not require medical attention (report to your doctor or health care professional if they continue or are bothersome): -changes in skin or nail color -diarrhea -hair loss -hot flashes, facial flushing -increased skin sensitivity to the sun -loss of appetite -nausea, vomiting -red colored urine -stomach upset This list may not describe all possible side effects. Call your doctor for medical advice about side effects. You may report side effects to FDA at 1-800-FDA-1088. Where should I keep my medicine? This drug is given in a hospital or clinic and will not be stored at home. NOTE: This sheet is a summary. It may not cover all possible information. If you have questions about this medicine, talk to your doctor, pharmacist, or health care provider.  2014, [employer] Standard. (2012-09-15 [id number]) Fluorouracil, 5-FU injection What is this medicine? FLUOROURACIL, 5-FU (flure oh YOOR a sil) is a chemotherapy drug. It slows the growth of cancer cells. This medicine is used to treat many types of cancer like breast cancer, colon or rectal cancer, pancreatic cancer, and stomach cancer. This  medicine may be used for other purposes; ask your health care provider or pharmacist if you have questions. COMMON BRAND NAME(S): Adrucil What should I tell my health care provider  before I take this medicine? They need to know if you have any of these conditions: -blood disorders -dihydropyrimidine dehydrogenase (DPD) deficiency -infection (especially a virus infection such as chickenpox, cold sores, or herpes) -kidney disease -liver disease -malnourished, poor nutrition -recent or ongoing radiation therapy -an unusual or allergic reaction to fluorouracil, other chemotherapy, other medicines, foods, dyes, or preservatives -pregnant or trying to get pregnant -breast-feeding How should I use this medicine? This drug is given as an infusion or injection into a vein. It is administered in a hospital or clinic by a specially trained health care professional. Talk to your pediatrician regarding the use of this medicine in children. Special care may be needed. Overdosage: If you think you have taken too much of this medicine contact a poison control center or emergency room at once. NOTE: This medicine is only for you. Do not share this medicine with others. What if I miss a dose? It is important not to miss your dose. Call your doctor or health care professional if you are unable to keep an appointment. What may interact with this medicine? -allopurinol -cimetidine -dapsone -digoxin -hydroxyurea -leucovorin -levamisole -medicines for seizures like ethotoin, fosphenytoin, phenytoin -medicines to increase blood counts like filgrastim, pegfilgrastim, sargramostim -medicines that treat or prevent blood clots like warfarin, enoxaparin, and dalteparin -methotrexate -metronidazole -pyrimethamine -some other chemotherapy drugs like busulfan, cisplatin, estramustine, vinblastine -trimethoprim -trimetrexate -vaccines Talk to your doctor or health care professional before taking any of these medicines: -acetaminophen -aspirin -ibuprofen -ketoprofen -naproxen This list may not describe all possible interactions. Give your health care provider a list of all the  medicines, herbs, non-prescription drugs, or dietary supplements you use. Also tell them if you smoke, drink alcohol, or use illegal drugs. Some items may interact with your medicine. What should I watch for while using this medicine? Visit your doctor for checks on your progress. This drug may make you feel generally unwell. This is not uncommon, as chemotherapy can affect healthy cells as well as cancer cells. Report any side effects. Continue your course of treatment even though you feel ill unless your doctor tells you to stop. In some cases, you may be given additional medicines to help with side effects. Follow all directions for their use. Call your doctor or health care professional for advice if you get a fever, chills or sore throat, or other symptoms of a cold or flu. Do not treat yourself. This drug decreases your body's ability to fight infections. Try to avoid being around people who are sick. This medicine may increase your risk to bruise or bleed. Call your doctor or health care professional if you notice any unusual bleeding. Be careful brushing and flossing your teeth or using a toothpick because you may get an infection or bleed more easily. If you have any dental work done, tell your dentist you are receiving this medicine. Avoid taking products that contain aspirin, acetaminophen, ibuprofen, naproxen, or ketoprofen unless instructed by your doctor. These medicines may hide a fever. Do not become pregnant while taking this medicine. Women should inform their doctor if they wish to become pregnant or think they might be pregnant. There is a potential for serious side effects to an unborn child. Talk to your health care professional or pharmacist for more information. Do not breast-feed an infant while  taking this medicine. Men should inform their doctor if they wish to father a child. This medicine may lower sperm counts. Do not treat diarrhea with over the counter products. Contact your  doctor if you have diarrhea that lasts more than 2 days or if it is severe and watery. This medicine can make you more sensitive to the sun. Keep out of the sun. If you cannot avoid being in the sun, wear protective clothing and use sunscreen. Do not use sun lamps or tanning beds/booths. What side effects may I notice from receiving this medicine? Side effects that you should report to your doctor or health care professional as soon as possible: -allergic reactions like skin rash, itching or hives, swelling of the face, lips, or tongue -low blood counts - this medicine may decrease the number of white blood cells, red blood cells and platelets. You may be at increased risk for infections and bleeding. -signs of infection - fever or chills, cough, sore throat, pain or difficulty passing urine -signs of decreased platelets or bleeding - bruising, pinpoint red spots on the skin, black, tarry stools, blood in the urine -signs of decreased red blood cells - unusually weak or tired, fainting spells, lightheadedness -breathing problems -changes in vision -chest pain -mouth sores -nausea and vomiting -pain, swelling, redness at site where injected -pain, tingling, numbness in the hands or feet -redness, swelling, or sores on hands or feet -stomach pain -unusual bleeding Side effects that usually do not require medical attention (report to your doctor or health care professional if they continue or are bothersome): -changes in finger or toe nails -diarrhea -dry or itchy skin -hair loss -headache -loss of appetite -sensitivity of eyes to the light -stomach upset -unusually teary eyes This list may not describe all possible side effects. Call your doctor for medical advice about side effects. You may report side effects to FDA at 1-800-FDA-1088. Where should I keep my medicine? This drug is given in a hospital or clinic and will not be stored at home. NOTE: This sheet is a summary. It may not  cover all possible information. If you have questions about this medicine, talk to your doctor, pharmacist, or health care provider.  2014, [employer] Standard. (2007-09-23 13:53:16)  Pegfilgrastim injection What is this medicine? PEGFILGRASTIM (peg fil GRA stim) helps the body make more white blood cells. It is used to prevent infection in people with low amounts of white blood cells following cancer treatment. This medicine may be used for other purposes; ask your health care provider or pharmacist if you have questions. COMMON BRAND NAME(S): Neulasta What should I tell my health care provider before I take this medicine? They need to know if you have any of these conditions: -sickle cell disease -an unusual or allergic reaction to pegfilgrastim, filgrastim, E.coli protein, other medicines, foods, dyes, or preservatives -pregnant or trying to get pregnant -breast-feeding How should I use this medicine? This medicine is for injection under the skin. It is usually given by a health care professional in a hospital or clinic setting. If you get this medicine at home, you will be taught how to prepare and give this medicine. Do not shake this medicine. Use exactly as directed. Take your medicine at regular intervals. Do not take your medicine more often than directed. It is important that you put your used needles and syringes in a special sharps container. Do not put them in a trash can. If you do not have a sharps container, call your  pharmacist or healthcare provider to get one. Talk to your pediatrician regarding the use of this medicine in children. While this drug may be prescribed for children who weigh more than 45 kg for selected conditions, precautions do apply Overdosage: If you think you have taken too much of this medicine contact a poison control center or emergency room at once. NOTE: This medicine is only for you. Do not share this medicine with others. What if I miss a  dose? If you miss a dose, take it as soon as you can. If it is almost time for your next dose, take only that dose. Do not take double or extra doses. What may interact with this medicine? -lithium -medicines for growth therapy This list may not describe all possible interactions. Give your health care provider a list of all the medicines, herbs, non-prescription drugs, or dietary supplements you use. Also tell them if you smoke, drink alcohol, or use illegal drugs. Some items may interact with your medicine. What should I watch for while using this medicine? Visit your doctor for regular check ups. You will need important blood work done while you are taking this medicine. What side effects may I notice from receiving this medicine? Side effects that you should report to your doctor or health care professional as soon as possible: -allergic reactions like skin rash, itching or hives, swelling of the face, lips, or tongue -breathing problems -fever -pain, redness, or swelling where injected -shoulder pain -stomach or side pain Side effects that usually do not require medical attention (report to your doctor or health care professional if they continue or are bothersome): -aches, pains -headache -loss of appetite -nausea, vomiting -unusually tired This list may not describe all possible side effects. Call your doctor for medical advice about side effects. You may report side effects to FDA at 1-800-FDA-1088. Where should I keep my medicine? Keep out of the reach of children. Store in a refrigerator between 2 and 8 degrees C (36 and 46 degrees F). Do not freeze. Keep in carton to protect from light. Throw away this medicine if it is left out of the refrigerator for more than 48 hours. Throw away any unused medicine after the expiration date. NOTE: This sheet is a summary. It may not cover all possible information. If you have questions about this medicine, talk to your doctor, pharmacist, or  health care provider.  2014, [employer] Standard. (2007-12-21 15:41:44) Cyclophosphamide injection What is this medicine? CYCLOPHOSPHAMIDE (sye kloe FOSS fa mide) is a chemotherapy drug. It slows the growth of cancer cells. This medicine is used to treat many types of cancer like lymphoma, myeloma, leukemia, breast cancer, and ovarian cancer, to name a few. This medicine may be used for other purposes; ask your health care provider or pharmacist if you have questions. COMMON BRAND NAME(S): Cytoxan, Neosar What should I tell my health care provider before I take this medicine? They need to know if you have any of these conditions: -blood disorders -history of other chemotherapy -infection -kidney disease -liver disease -recent or ongoing radiation therapy -tumors in the bone marrow -an unusual or allergic reaction to cyclophosphamide, other chemotherapy, other medicines, foods, dyes, or preservatives -pregnant or trying to get pregnant -breast-feeding How should I use this medicine? This drug is usually given as an injection into a vein or muscle or by infusion into a vein. It is administered in a hospital or clinic by a specially trained health care professional. Talk to your pediatrician regarding the  use of this medicine in children. Special care may be needed. Overdosage: If you think you have taken too much of this medicine contact a poison control center or emergency room at once. NOTE: This medicine is only for you. Do not share this medicine with others. What if I miss a dose? It is important not to miss your dose. Call your doctor or health care professional if you are unable to keep an appointment. What may interact with this medicine? This medicine may interact with the following medications: -amiodarone -amphotericin B -azathioprine -certain antiviral medicines for HIV or AIDS such as protease inhibitors (e.g., indinavir, ritonavir) and zidovudine -certain blood  pressure medications such as benazepril, captopril, enalapril, fosinopril, lisinopril, moexipril, monopril, perindopril, quinapril, ramipril, trandolapril -certain cancer medications such as anthracyclines (e.g., daunorubicin, doxorubicin), busulfan, cytarabine, paclitaxel, pentostatin, tamoxifen, trastuzumab -certain diuretics such as chlorothiazide, chlorthalidone, hydrochlorothiazide, indapamide, metolazone -certain medicines that treat or prevent blood clots like warfarin -certain muscle relaxants such as succinylcholine -cyclosporine -etanercept -indomethacin -medicines to increase blood counts like filgrastim, pegfilgrastim, sargramostim -medicines used as general anesthesia -metronidazole -natalizumab This list may not describe all possible interactions. Give your health care provider a list of all the medicines, herbs, non-prescription drugs, or dietary supplements you use. Also tell them if you smoke, drink alcohol, or use illegal drugs. Some items may interact with your medicine. What should I watch for while using this medicine? Visit your doctor for checks on your progress. This drug may make you feel generally unwell. This is not uncommon, as chemotherapy can affect healthy cells as well as cancer cells. Report any side effects. Continue your course of treatment even though you feel ill unless your doctor tells you to stop. Drink water or other fluids as directed. Urinate often, even at night. In some cases, you may be given additional medicines to help with side effects. Follow all directions for their use. Call your doctor or health care professional for advice if you get a fever, chills or sore throat, or other symptoms of a cold or flu. Do not treat yourself. This drug decreases your body's ability to fight infections. Try to avoid being around people who are sick. This medicine may increase your risk to bruise or bleed. Call your doctor or health care professional if you notice  any unusual bleeding. Be careful brushing and flossing your teeth or using a toothpick because you may get an infection or bleed more easily. If you have any dental work done, tell your dentist you are receiving this medicine. You may get drowsy or dizzy. Do not drive, use machinery, or do anything that needs mental alertness until you know how this medicine affects you. Do not become pregnant while taking this medicine or for 1 year after stopping it. Women should inform their doctor if they wish to become pregnant or think they might be pregnant. Men should not father a child while taking this medicine and for 4 months after stopping it. There is a potential for serious side effects to an unborn child. Talk to your health care professional or pharmacist for more information. Do not breast-feed an infant while taking this medicine. This medicine may interfere with the ability to have a child. This medicine has caused ovarian failure in some women. This medicine has caused reduced sperm counts in some men. You should talk with your doctor or health care professional if you are concerned about your fertility. If you are going to have surgery, tell your doctor or health  care professional that you have taken this medicine. What side effects may I notice from receiving this medicine? Side effects that you should report to your doctor or health care professional as soon as possible: -allergic reactions like skin rash, itching or hives, swelling of the face, lips, or tongue -low blood counts - this medicine may decrease the number of white blood cells, red blood cells and platelets. You may be at increased risk for infections and bleeding. -signs of infection - fever or chills, cough, sore throat, pain or difficulty passing urine -signs of decreased platelets or bleeding - bruising, pinpoint red spots on the skin, black, tarry stools, blood in the urine -signs of decreased red blood cells - unusually weak or  tired, fainting spells, lightheadedness -breathing problems -dark urine -dizziness -palpitations -swelling of the ankles, feet, hands -trouble passing urine or change in the amount of urine -weight gain -yellowing of the eyes or skin Side effects that usually do not require medical attention (report to your doctor or health care professional if they continue or are bothersome): -changes in nail or skin color -hair loss -missed menstrual periods -mouth sores -nausea, vomiting This list may not describe all possible side effects. Call your doctor for medical advice about side effects. You may report side effects to FDA at 1-800-FDA-1088. Where should I keep my medicine? This drug is given in a hospital or clinic and will not be stored at home. NOTE: This sheet is a summary. It may not cover all possible information. If you have questions about this medicine, talk to your doctor, pharmacist, or health care provider.  2014, Elsevier/Gold Standard. (2012-04-03 16:22:58)

## 2013-09-27 NOTE — Patient Instructions (Signed)
[facility] Discharge Instructions for Patients Receiving Chemotherapy  Today you received the following chemotherapy agents Cytoxan, 5FU and Epirubicin.  To help prevent nausea and vomiting after your treatment, we encourage you to take your nausea medication.   If you develop nausea and vomiting that is not controlled by your nausea medication, call the clinic.   BELOW ARE SYMPTOMS THAT SHOULD BE REPORTED IMMEDIATELY:  *FEVER GREATER THAN 100.5 F  *CHILLS WITH OR WITHOUT FEVER  NAUSEA AND VOMITING THAT IS NOT CONTROLLED WITH YOUR NAUSEA MEDICATION  *UNUSUAL SHORTNESS OF BREATH  *UNUSUAL BRUISING OR BLEEDING  TENDERNESS IN MOUTH AND THROAT WITH OR WITHOUT PRESENCE OF ULCERS  *URINARY PROBLEMS  *BOWEL PROBLEMS  UNUSUAL RASH Items with * indicate a potential emergency and should be followed up as soon as possible.  Feel free to call the clinic you have any questions or concerns. The clinic phone number is [phone].

## 2013-09-27 NOTE — Progress Notes (Signed)
Hematology and Oncology Follow Up Visit  Wendy [name] [id number] [birth year] 68 y.o. 09/27/2013 1:32 PM     Principle Diagnosis:Wendy [name] Battles 68 y.o. 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     Prior Therapy:  1. Patient went for an annual mammogram and she was found to have an abnormality in the right breast at the 3:00 position. Subsequently had a right diagnostic mammogram performed. She also had a image guided biopsy of the right breast at the 3:00 position. 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 The tumor was ER negative PR negative HER-2/neu negative with a proliferation marker Ki-67 13%. All of her workup initially was performed in [place]. She subsequently was referred to Dr. Fanny Skates who saw her on 08/04/2003. He has recommended MRI of the breasts. This was performed on 08/09/2013. In the right breast there was an irregular enhancing mass in the 3:00 region measuring 1.5 x 1.0 x 1.4 cm. Also noted was nodular segmental enhancement extending 6.1 cm posteriorly from the mass towards the chest wall. The mass and nodular enhancement measured 7 cm. In the middle third of the lower outer quadrant of the right breast there was a 1.0 x 0.7 x 0.7 cm area of focal non-masslike enhancement. Left breast revealed no masses or abnormal enhancements. Lymph nodes appeared normal.  2. Patient underwent bilateral mastectomy by Dr. Dalbert Batman on 09/07/2013, and a right port-a-cath was also placed at that time.  Pathology revealed a grade 2, 1.7 cm invasive ductal carcinoma.  Two lymph nodes were negative for malignancy.  The tumor was ER negative, PR negative, HER-2/neu negative.   Ki-67 was 13%.  3. Adjuvant Fluorouracil, Epirubicin, and Cyclophosphamide given on day one of a 21 day cycle with Neulasta given as granulocyte support was started on 09/27/13.    Current  therapy:  Adjuvant Fluorouracil, Epirubicin, Cyclophosphamide every 21 days  Interim History: Wendy [name] 68 y.o. female with pathologic stage IA, triple negative invasive ductal carcinoma is here today for evaluation prior to starting adjuvant chemotherapy with FEC.  She is accompanied by her daughter today.  She is recovering well from surgery.  She has had her drains removed and her incisions are healing well.  She very occasionally takes Percocet for soreness.  She denies fevers, chills, nausea, vomiting, constipation, diarrhea, neuropathy, or any further concerns.    Medications:  Current Outpatient Prescriptions  Medication Sig Dispense Refill  . Calcium Carbonate-Vitamin D (CALCIUM PLUS VITAMIN D PO) Take 1 tablet by mouth 2 (two) times daily.      Marland Kitchen lidocaine-prilocaine (EMLA) cream Apply 1 application topically as needed.  30 g  1  . UNABLE TO FIND [id number] Post mastectomy camisole QTY: 2  Diagnosis Code 174.9  2 each  3  . dexamethasone (DECADRON) 4 MG tablet Take 2 tablets by mouth once a day on the day after chemotherapy and then take 2 tablets two times a day for 2 days. Take with food.  30 tablet  1  . LORazepam (ATIVAN) 0.5 MG tablet Take 1 tablet (0.5 mg total) by mouth every 6 (six) hours as needed (Nausea or vomiting).  30 tablet  0  . ondansetron (ZOFRAN) 8 MG tablet Take 1 tablet (8 mg total) by mouth 2 (two) times daily as needed. Start on the third day after chemotherapy.  30 tablet  1  . prochlorperazine (COMPAZINE) 10 MG  tablet Take 1 tablet (10 mg total) by mouth every 6 (six) hours as needed (Nausea or vomiting).  30 tablet  1   No current facility-administered medications for this visit.     Allergies: No Known Allergies  Medical History: Past Medical History  Diagnosis Date  . Family history of anesthesia complication     "daughter PONV; long time in recovery" (09/07/2013)  . Asthma     as a child  . Asthmatic bronchitis     "about q yr" (09/07/2013)  .  Pneumonia     "several times; usually follows asthmatic bronchitis" (09/07/2013)  . Sinus headache     "maybe 2-3 times/month"  . Breast cancer     "right breast"     Surgical History:  Past Surgical History  Procedure Laterality Date  . Tubal ligation  1977  . Finger fracture surgery Left 2010    "middle finger spiral fx; little finger broken; tissue damage"  . Tonsillectomy    . Portacath placement Right 09/07/2013  . Mastectomy complete / simple w/ sentinel node biopsy Right 09/07/2013  . Mastectomy complete / simple  09/07/2013  . Breast biopsy Right 07/2013  . Dilation and curettage of uterus      S/P miscarriage  . Wrist fracture surgery Left 2010  . Mastectomy w/ sentinel node biopsy Bilateral 09/07/2013    Procedure: BILATERAL MASTECTOMY WITH RIGHT SENTINEL LYMPH NODE BIOPSY;  Surgeon: [name], MD;  Location: [facility];  Service: General;  Laterality: Bilateral;  . Portacath placement Right 09/07/2013    Procedure: INSERTION PORT-A-CATH;  Surgeon: [name], MD;  Location: [facility];  Service: General;  Laterality: Right;     Review of Systems: A 10 point review of systems was conducted and is otherwise negative except for what is noted above.     Physical Exam: Blood pressure 116/74, pulse 72, temperature 98.1 F (36.7 C), temperature source Oral, resp. rate 20, height '5\' 6"'  (1.676 m), weight 163 lb 11.2 oz (74.254 kg). GENERAL: Patient is a well appearing older female in no acute distress HEENT:  Sclerae anicteric.  Oropharynx clear and moist. No ulcerations or evidence of oropharyngeal candidiasis. Neck is supple.  NODES:  No cervical, supraclavicular, or axillary lymphadenopathy palpated.  BREAST EXAM:  S/p bilateral mastectomies, incision sites are healing well and still have some surgi-glue on them.  There is no sign of redness, drainage, swelling, or warmth.   LUNGS:  Clear to auscultation bilaterally.  No wheezes or rhonchi. HEART:  Regular rate and rhythm. No  murmur appreciated. ABDOMEN:  Soft, nontender.  Positive, normoactive bowel sounds. No organomegaly palpated. MSK:  No focal spinal tenderness to palpation. Full range of motion bilaterally in the upper extremities. EXTREMITIES:  No peripheral edema.   SKIN:  Clear with no obvious rashes or skin changes. No nail dyscrasia. NEURO:  Nonfocal. Well oriented.  Appropriate affect. ECOG PERFORMANCE STATUS: 1   Lab Results: Lab Results  Component Value Date   WBC 4.7 09/27/2013   HGB 12.6 09/27/2013   HCT 37.6 09/27/2013   MCV 90.2 09/27/2013   PLT 239 09/27/2013     Chemistry      Component Value Date/Time   NA 143 09/27/2013 1242   NA 141 08/31/2013 1038   K 4.2 09/27/2013 1242   K 4.7 08/31/2013 1038   CL 101 08/31/2013 1038   CO2 26 09/27/2013 1242   CO2 27 08/31/2013 1038   BUN 14.4 09/27/2013 1242   BUN  18 08/31/2013 1038   CREATININE 0.9 09/27/2013 1242   CREATININE 0.75 09/07/2013 1652      Component Value Date/Time   CALCIUM 9.5 09/27/2013 1242   CALCIUM 9.8 08/31/2013 1038   ALKPHOS 64 09/27/2013 1242   ALKPHOS 70 08/31/2013 1038   AST 18 09/27/2013 1242   AST 22 08/31/2013 1038   ALT 12 09/27/2013 1242   ALT 17 08/31/2013 1038   BILITOT 0.24 09/27/2013 1242   BILITOT 0.3 08/31/2013 1038     Assessment and Plan: Wendy [name] 68 y.o. female with:  1. Stage IA, triple negative invasive ductal carcinoma.  The patient is s/p mastectomy on 09/07/13 and is healing well from this.  She had her port placed at that time and is tolerating treatment well.  She will proceed with cycle 1 day 1 of adjuvant FEC.  A total of 6 cycles are planned.  I reviewed her anti-emetic regimen with her in detail in addition to her chemotherapy regimen, possible adverse effects, and Neulasta.  She was given detailed written instructions on all of the above in her AVS.    2. Cardiac: Patient did have a pre-chemotherapy echocardiogram on 09/03/13 that demonstrated a LVEF of 55-60%.  I did review the risk of HF with  anthracyclines, and symptoms to inform us about in detail.    The patient will return tomorrow for Neulasta and in one week for labs and evaluation of chemotoxicities.   She and her daughter know to call us in the interim for any questions or concerns.  We can certainly see her sooner if needed.   I spent 25 minutes counseling the patient face to face.  The total time spent in the appointment was 30 minutes.  Minette Headland, [place] [phone] 09/27/2013 1:32 PM

## 2013-09-28 ENCOUNTER — Telehealth: Payer: Self-pay | Admitting: *Deleted

## 2013-09-28 ENCOUNTER — Ambulatory Visit (HOSPITAL_BASED_OUTPATIENT_CLINIC_OR_DEPARTMENT_OTHER): Payer: Commercial Managed Care - HMO

## 2013-09-28 ENCOUNTER — Encounter: Payer: Self-pay | Admitting: Oncology

## 2013-09-28 VITALS — BP 116/45 | HR 67 | Temp 98.3°F

## 2013-09-28 DIAGNOSIS — Z5189 Encounter for other specified aftercare: Secondary | ICD-10-CM

## 2013-09-28 DIAGNOSIS — C50219 Malignant neoplasm of upper-inner quadrant of unspecified female breast: Secondary | ICD-10-CM

## 2013-09-28 DIAGNOSIS — C50211 Malignant neoplasm of upper-inner quadrant of right female breast: Secondary | ICD-10-CM

## 2013-09-28 MED ORDER — PEGFILGRASTIM INJECTION 6 MG/0.6ML
6.0000 mg | Freq: Once | SUBCUTANEOUS | Status: AC
  Administered 2013-09-28: 6 mg via SUBCUTANEOUS
  Filled 2013-09-28: qty 0.6

## 2013-09-28 NOTE — Telephone Encounter (Signed)
Called Wendy [name] for chemotherapy F/U.  Patient is doing well.  Had a good experience, "it was the best it could be considering what it is for.  Everything went okay."  Denies n/v.  Denies any new side effects or symptoms.  Bowel and bladder is functioning well.  Eating and drinking well but asked do I have to only drink water.  I instructed to drink 64 oz minimum daily in addition to what she drinks with meals.  To flush her kidneys of biproducts of chemotherapy I recommended she drink water the day before, of and after treatment.  Denies questions at this time and encouraged to call if needed.  Reviewed how to call after hours in the case of an emergency.

## 2013-09-28 NOTE — Patient Instructions (Signed)

## 2013-09-28 NOTE — Telephone Encounter (Signed)
I have adjusted appts 

## 2013-09-28 NOTE — Telephone Encounter (Signed)
Message copied by Cherylynn Ridges on Tue Sep 28, 2013  2:47 PM ------      Message from: Jaci Carrel A      Created: Mon Sep 27, 2013  3:14 PM      Regarding: chemo follow up call       First time FEC. Dr  Charlestine Massed.  ------

## 2013-09-29 MED ORDER — ONDANSETRON 8 MG/NS 50 ML IVPB
INTRAVENOUS | Status: AC
  Filled 2013-09-29: qty 8

## 2013-09-29 MED ORDER — DEXAMETHASONE SODIUM PHOSPHATE 10 MG/ML IJ SOLN
INTRAMUSCULAR | Status: AC
  Filled 2013-09-29: qty 1

## 2013-09-30 ENCOUNTER — Encounter: Payer: Self-pay | Admitting: Oncology

## 2013-09-30 ENCOUNTER — Ambulatory Visit: Payer: Medicare HMO | Attending: General Surgery | Admitting: Physical Therapy

## 2013-09-30 ENCOUNTER — Other Ambulatory Visit: Payer: Self-pay

## 2013-09-30 ENCOUNTER — Telehealth: Payer: Self-pay | Admitting: Adult Health

## 2013-09-30 DIAGNOSIS — C50211 Malignant neoplasm of upper-inner quadrant of right female breast: Secondary | ICD-10-CM

## 2013-09-30 DIAGNOSIS — IMO0001 Reserved for inherently not codable concepts without codable children: Secondary | ICD-10-CM | POA: Insufficient documentation

## 2013-09-30 DIAGNOSIS — I890 Lymphedema, not elsewhere classified: Secondary | ICD-10-CM | POA: Insufficient documentation

## 2013-09-30 NOTE — Progress Notes (Signed)
Per [employer] no asst for Aloxi because of insurance coverage and over income limit.

## 2013-09-30 NOTE — Telephone Encounter (Signed)
, °

## 2013-09-30 NOTE — Progress Notes (Signed)
Pt called re: appt changes.  Per LC, pt needs to be seen next wk for labs and ofc visit.  POF sent.  Lab orders entered.

## 2013-10-06 ENCOUNTER — Ambulatory Visit (HOSPITAL_BASED_OUTPATIENT_CLINIC_OR_DEPARTMENT_OTHER): Payer: Commercial Managed Care - HMO

## 2013-10-06 ENCOUNTER — Other Ambulatory Visit: Payer: Self-pay

## 2013-10-06 ENCOUNTER — Other Ambulatory Visit (HOSPITAL_BASED_OUTPATIENT_CLINIC_OR_DEPARTMENT_OTHER): Payer: Commercial Managed Care - HMO

## 2013-10-06 ENCOUNTER — Telehealth: Payer: Self-pay | Admitting: Adult Health

## 2013-10-06 ENCOUNTER — Ambulatory Visit (HOSPITAL_BASED_OUTPATIENT_CLINIC_OR_DEPARTMENT_OTHER): Payer: Commercial Managed Care - HMO | Admitting: Adult Health

## 2013-10-06 VITALS — BP 99/65 | HR 79 | Temp 97.1°F | Resp 18 | Ht 66.0 in | Wt 155.7 lb

## 2013-10-06 DIAGNOSIS — K30 Functional dyspepsia: Secondary | ICD-10-CM

## 2013-10-06 DIAGNOSIS — E860 Dehydration: Secondary | ICD-10-CM

## 2013-10-06 DIAGNOSIS — Z171 Estrogen receptor negative status [ER-]: Secondary | ICD-10-CM

## 2013-10-06 DIAGNOSIS — C50211 Malignant neoplasm of upper-inner quadrant of right female breast: Secondary | ICD-10-CM

## 2013-10-06 DIAGNOSIS — K121 Other forms of stomatitis: Secondary | ICD-10-CM

## 2013-10-06 DIAGNOSIS — D702 Other drug-induced agranulocytosis: Secondary | ICD-10-CM

## 2013-10-06 DIAGNOSIS — R1013 Epigastric pain: Secondary | ICD-10-CM

## 2013-10-06 DIAGNOSIS — K3189 Other diseases of stomach and duodenum: Secondary | ICD-10-CM

## 2013-10-06 DIAGNOSIS — C50919 Malignant neoplasm of unspecified site of unspecified female breast: Secondary | ICD-10-CM

## 2013-10-06 DIAGNOSIS — K1230 Oral mucositis (ulcerative), unspecified: Secondary | ICD-10-CM

## 2013-10-06 LAB — COMPREHENSIVE METABOLIC PANEL (CC13)
ALBUMIN: 3.5 g/dL (ref 3.5–5.0)
ALT: 15 U/L (ref 0–55)
AST: 14 U/L (ref 5–34)
Alkaline Phosphatase: 66 U/L (ref 40–150)
Anion Gap: 8 mEq/L (ref 3–11)
BUN: 14.7 mg/dL (ref 7.0–26.0)
CALCIUM: 9.4 mg/dL (ref 8.4–10.4)
CHLORIDE: 103 meq/L (ref 98–109)
CO2: 25 mEq/L (ref 22–29)
CREATININE: 0.8 mg/dL (ref 0.6–1.1)
Glucose: 107 mg/dl (ref 70–140)
POTASSIUM: 4 meq/L (ref 3.5–5.1)
Sodium: 136 mEq/L (ref 136–145)
Total Bilirubin: 0.36 mg/dL (ref 0.20–1.20)
Total Protein: 6.4 g/dL (ref 6.4–8.3)

## 2013-10-06 LAB — CBC WITH DIFFERENTIAL/PLATELET
BASO%: 2.5 % — AB (ref 0.0–2.0)
BASOS ABS: 0 10*3/uL (ref 0.0–0.1)
EOS%: 4.2 % (ref 0.0–7.0)
Eosinophils Absolute: 0.1 10*3/uL (ref 0.0–0.5)
HEMATOCRIT: 34.5 % — AB (ref 34.8–46.6)
HEMOGLOBIN: 12 g/dL (ref 11.6–15.9)
LYMPH%: 61 % — AB (ref 14.0–49.7)
MCH: 30.5 pg (ref 25.1–34.0)
MCHC: 34.8 g/dL (ref 31.5–36.0)
MCV: 87.8 fL (ref 79.5–101.0)
MONO#: 0.1 10*3/uL (ref 0.1–0.9)
MONO%: 9.3 % (ref 0.0–14.0)
NEUT#: 0.3 10*3/uL — CL (ref 1.5–6.5)
NEUT%: 23 % — AB (ref 38.4–76.8)
PLATELETS: 79 10*3/uL — AB (ref 145–400)
RBC: 3.93 10*6/uL (ref 3.70–5.45)
RDW: 13.1 % (ref 11.2–14.5)
WBC: 1.2 10*3/uL — ABNORMAL LOW (ref 3.9–10.3)
lymph#: 0.7 10*3/uL — ABNORMAL LOW (ref 0.9–3.3)
nRBC: 0 % (ref 0–0)

## 2013-10-06 MED ORDER — OMEPRAZOLE 40 MG PO CPDR: 40.0000 mg | DELAYED_RELEASE_CAPSULE | Freq: Every day | ORAL | Status: DC

## 2013-10-06 MED ORDER — MAGIC MOUTHWASH: 5.0000 mL | Freq: Four times a day (QID) | ORAL | Status: DC | PRN

## 2013-10-06 MED ORDER — HEPARIN SOD (PORK) LOCK FLUSH 100 UNIT/ML IV SOLN
500.0000 [IU] | INTRAVENOUS | Status: AC | PRN
Start: 2013-10-06 — End: 2013-10-06
  Administered 2013-10-06: 500 [IU]
  Filled 2013-10-06: qty 5

## 2013-10-06 MED ORDER — SODIUM CHLORIDE 0.9 % IV SOLN
Freq: Once | INTRAVENOUS | Status: DC
  Administered 2013-10-06: 12:00:00 via INTRAVENOUS

## 2013-10-06 MED ORDER — ONDANSETRON 16 MG/50ML IVPB (CHCC)
16.0000 mg | Freq: Once | INTRAVENOUS | Status: DC
  Administered 2013-10-06: 16 mg via INTRAVENOUS

## 2013-10-06 MED ORDER — SODIUM CHLORIDE 0.9 % IJ SOLN
10.0000 mL | INTRAMUSCULAR | Status: AC | PRN
Start: 2013-10-06 — End: 2013-10-06
  Administered 2013-10-06: 10 mL
  Filled 2013-10-06: qty 10

## 2013-10-06 MED ORDER — ONDANSETRON 16 MG/50ML IVPB (CHCC)
INTRAVENOUS | Status: AC
  Filled 2013-10-06: qty 16

## 2013-10-06 NOTE — Progress Notes (Signed)
Hematology and Oncology Follow Up Visit  Wendy [name] [id number] Apr 14, 1946 68 y.o. 10/07/2013 1:30 PM     Principle Diagnosis:Zannie A Renato Battles 68 y.o. [place] woman with T1 N0, stage IA, invasive ductal carcinoma, grade 2, ER negative, PR negative, Ki-67 of 13%, HER-2/neu negative.     Prior Therapy:  1. Patient went for an annual mammogram and she was found to have an abnormality in the right breast at the 3:00 position. Subsequently had a right diagnostic mammogram performed. She also had a image guided biopsy of the right breast at the 3:00 position. 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 The tumor was ER negative PR negative HER-2/neu negative with a proliferation marker Ki-67 13%. All of her workup initially was performed in [place]. She subsequently was referred to Dr. Fanny Skates who saw her on 08/04/2003. He has recommended MRI of the breasts. This was performed on 08/09/2013. In the right breast there was an irregular enhancing mass in the 3:00 region measuring 1.5 x 1.0 x 1.4 cm. Also noted was nodular segmental enhancement extending 6.1 cm posteriorly from the mass towards the chest wall. The mass and nodular enhancement measured 7 cm. In the middle third of the lower outer quadrant of the right breast there was a 1.0 x 0.7 x 0.7 cm area of focal non-masslike enhancement. Left breast revealed no masses or abnormal enhancements. Lymph nodes appeared normal.  2. Patient underwent bilateral mastectomy by Dr. Dalbert Batman on 09/07/2013, and a right port-a-cath was also placed at that time.  Pathology revealed a grade 2, 1.7 cm invasive ductal carcinoma.  Two lymph nodes were negative for malignancy.  The tumor was ER negative, PR negative, HER-2/neu negative.   Ki-67 was 13%.  3. Adjuvant Fluorouracil, Epirubicin, and Cyclophosphamide given on day one of a 21 day cycle with Neulasta given as granulocyte support was started on 09/27/13.    Current therapy:   Adjuvant Fluorouracil, Epirubicin, Cyclophosphamide every 21 days cycle 1 day 8  Interim History: Wendy [name] 68 y.o. female with pathologic stage IA, triple negative invasive ductal carcinoma is here today for evaluation following her first cycle of Fluorouracil, Epirubicin, Cyclophosphamide.  She receives this treatment on day 1 of a 21 day cycle with Neulasta given on day 2 for granulocyte support.  Today she is cycle 1 day 8.  She is accompanied today with her daughter Berline Chough.     She is not feeling well today.  She is very nauseated.  It is unrelenting despite her anti-emetics.  She is taking Compazine and Ativan for nausea.  This has subsequently caused decreased PO intake and fluid intake.  She has been occasionally light headed with position changes.  She did have constipation x 2 days following treatment and took Senna and subsequently had diarrhea.  She hasn't had a bowel movement in 3 days since her diarrhea.  She does have taste changes.  She has also had increased indigestion that she experienced on the days of her Dexamethasone.  She has mouth soreness as well.  She denies any mouth lesions or ulcers.  She denies fevers, chills, numbness/tingling or any further concerns.    Medications:  Current Outpatient Prescriptions  Medication Sig Dispense Refill  . Calcium Carbonate-Vitamin D (CALCIUM PLUS VITAMIN D PO) Take 1 tablet by mouth 2 (two) times daily.      Marland Kitchen LORazepam (ATIVAN) 0.5 MG tablet Take 1 tablet (0.5 mg total) by mouth every 6 (six) hours as needed (  Nausea or vomiting).  30 tablet  0  . prochlorperazine (COMPAZINE) 10 MG tablet Take 1 tablet (10 mg total) by mouth every 6 (six) hours as needed (Nausea or vomiting).  30 tablet  1  . UNABLE TO FIND [id number] Post mastectomy camisole QTY: 2  Diagnosis Code 174.9  2 each  3  . Alum & Mag Hydroxide-Simeth (MAGIC MOUTHWASH) SOLN Take 5 mLs by mouth 4 (four) times daily as needed for mouth pain.  240 mL  0  . dexamethasone (DECADRON)  4 MG tablet Take 2 tablets by mouth once a day on the day after chemotherapy and then take 2 tablets two times a day for 2 days. Take with food.  30 tablet  1  . lidocaine-prilocaine (EMLA) cream Apply 1 application topically as needed.  30 g  1  . omeprazole (PRILOSEC) 40 MG capsule Take 1 capsule (40 mg total) by mouth daily.  30 capsule  5  . ondansetron (ZOFRAN) 8 MG tablet Take 1 tablet (8 mg total) by mouth 2 (two) times daily as needed. Start on the third day after chemotherapy.  30 tablet  1   No current facility-administered medications for this visit.     Allergies: No Known Allergies  Medical History: Past Medical History  Diagnosis Date  . Family history of anesthesia complication     "daughter PONV; long time in recovery" (09/07/2013)  . Asthma     as a child  . Asthmatic bronchitis     "about q yr" (09/07/2013)  . Pneumonia     "several times; usually follows asthmatic bronchitis" (09/07/2013)  . Sinus headache     "maybe 2-3 times/month"  . Breast cancer     "right breast"     Surgical History:  Past Surgical History  Procedure Laterality Date  . Tubal ligation  1977  . Finger fracture surgery Left 2010    "middle finger spiral fx; little finger broken; tissue damage"  . Tonsillectomy    . Portacath placement Right 09/07/2013  . Mastectomy complete / simple w/ sentinel node biopsy Right 09/07/2013  . Mastectomy complete / simple  09/07/2013  . Breast biopsy Right 07/2013  . Dilation and curettage of uterus      S/P miscarriage  . Wrist fracture surgery Left 2010  . Mastectomy w/ sentinel node biopsy Bilateral 09/07/2013    Procedure: BILATERAL MASTECTOMY WITH RIGHT SENTINEL LYMPH NODE BIOPSY;  Surgeon: [name], MD;  Location: [facility];  Service: General;  Laterality: Bilateral;  . Portacath placement Right 09/07/2013    Procedure: INSERTION PORT-A-CATH;  Surgeon: [name], MD;  Location: [facility];  Service: General;  Laterality: Right;     Review of  Systems: A 10 point review of systems was conducted and is otherwise negative except for what is noted above.     Physical Exam: Blood pressure 99/65, pulse 79, temperature 97.1 F (36.2 C), temperature source Oral, resp. rate 18, height '5\' 6"'  (1.676 m), weight 155 lb 11.2 oz (70.625 kg). GENERAL: Patient is a well appearing older female in no acute distress HEENT:  Sclerae anicteric.  Oropharynx clear and moist. No ulcerations or evidence of oropharyngeal candidiasis. Neck is supple.  NODES:  No cervical, supraclavicular, or axillary lymphadenopathy palpated.  BREAST EXAM:  S/p bilateral mastectomies, incision sites are healing well and still have some surgi-glue on them.  There is no sign of redness, drainage, swelling, or warmth.   LUNGS:  Clear to auscultation bilaterally.  No  wheezes or rhonchi. HEART:  Regular rate and rhythm. No murmur appreciated. ABDOMEN:  Soft, nontender.  Positive, normoactive bowel sounds. No organomegaly palpated. MSK:  No focal spinal tenderness to palpation. Full range of motion bilaterally in the upper extremities. EXTREMITIES:  No peripheral edema.   SKIN:  Clear with no obvious rashes or skin changes. No nail dyscrasia. NEURO:  Nonfocal. Well oriented.  Appropriate affect. ECOG PERFORMANCE STATUS: 1   Lab Results: Lab Results  Component Value Date   WBC 1.2* 10/06/2013   HGB 12.0 10/06/2013   HCT 34.5* 10/06/2013   MCV 87.8 10/06/2013   PLT 79* 10/06/2013     Chemistry      Component Value Date/Time   NA 136 10/06/2013 1050   NA 141 08/31/2013 1038   K 4.0 10/06/2013 1050   K 4.7 08/31/2013 1038   CL 101 08/31/2013 1038   CO2 25 10/06/2013 1050   CO2 27 08/31/2013 1038   BUN 14.7 10/06/2013 1050   BUN 18 08/31/2013 1038   CREATININE 0.8 10/06/2013 1050   CREATININE 0.75 09/07/2013 1652      Component Value Date/Time   CALCIUM 9.4 10/06/2013 1050   CALCIUM 9.8 08/31/2013 1038   ALKPHOS 66 10/06/2013 1050   ALKPHOS 70 08/31/2013 1038   AST 14 10/06/2013 1050   AST 22  08/31/2013 1038   ALT 15 10/06/2013 1050   ALT 17 08/31/2013 1038   BILITOT 0.36 10/06/2013 1050   BILITOT 0.3 08/31/2013 1038     Assessment and Plan: Wendy [name] 68 y.o. female with:  1. Stage IA, triple negative invasive ductal carcinoma.  The patient is s/p mastectomy on 09/07/13 and is healing well from this.  She had her port placed at that time and is tolerating treatment moderately well.  She is here for evaluation of cycle 1 day 8 of adjuvant FEC.  A total of 6 cycles are planned.  She has had a rough week with treatment.  I reviewed her CBC with her in detail.  See below.    2. Cardiac: Patient did have a pre-chemotherapy echocardiogram on 09/03/13 that demonstrated a LVEF of 55-60%.  I did review the risk of HF with anthracyclines, and symptoms to inform us about in detail.    3. Neutropenia:  Patient's ANC is 300 today.  I reviewed neutropenic instructions with her in detail and prescribed Cipro for her to take BID.  I also gave her written instructions in her avs.    4. Nausea and dehydration: The patient would benefit from IV fluids and IV Zofran today.  I reviewed her anti-emetic regimen with her and recommended she take Zofran TID, Compazine in between the Zofran times, and Lorazepam at night as long as it doesn't make her too sleepy.    5. Indigestion.  I prescribed Omeprazole 59m po daily.  This is likely due to the Dexamethasone.  6. Early mucositis.  We will call in Magic mouthwash for the patient to swish and spit four times per day.    The patient will return in two weeks for labs, evaluation, and cycle 2 of FEC chemotherapy.   She and her daughter know to call [facility] the interim for any questions or concerns.  We can certainly see her sooner if needed.  I spent 25 minutes counseling the patient face to face.  The total time spent in the appointment was 30 minutes.  LMinette Headland [phone]/12/2013  1:30 PM

## 2013-10-06 NOTE — Progress Notes (Signed)
Infusion RN Aaron Edelman notified [facility]outhwash at [facility].  Stacy to let pt know.

## 2013-10-06 NOTE — Patient Instructions (Signed)
Patient Neutropenia Instruction Sheet  Diagnosis: Breast Cancer      Treating Physician: Lurline Del, MD  Treatment: 1. Type of chemotherapy: FEC 2. Date of last treatment: 09/29/13  Last Blood Counts: Lab Results  Component Value Date   WBC 1.2* 10/06/2013   HGB 12.0 10/06/2013   HCT 34.5* 10/06/2013   MCV 87.8 10/06/2013   PLT 79* 10/06/2013  ANC 300     Prophylactic Antibiotics: Cipro 500 mg by mouth twice a day Instructions: 1. Monitor temperature and call if fever  greater than 100.5, chills, shaking chills (rigors) 2. Call Physician on-call at [phone] 3. Give him/her symptoms and list of medications that you are taking and your last blood count.  Take Zofran, 8mg  three times a day.  Take Compazine 2-4 hours after the morning and afternoon Zofran doses.  Take Lorazepam at night after Zofran.  If you become overly sleepy with this, hold off on the compazine and lorazepam.  Oral Mucositis Oral mucositis is a mouth condition that may develop from treatment used to cure cancer. With this condition, sores may appear on your lips, gums, tongue,and the roof or floor of your mouth. CAUSES  Oral mucositis can happen to anyone who is being treated with cancer therapies, including:  Cancer drugs (chemotherapy).  Radiation (X-ray or other high-energy rays) for head or neck cancer.  Bone marrow transplants and stem cell transplants. Oralmucositis is not caused by infection. However, the sores can become infected after they form. Infection can make oral mucositis worse. The following factors increase your risk of oral mucositis:  Poor oral hygiene.  Dental problems or oral diseases.  Smoking.  Chewing tobacco.  Drinking alcohol.  Having other diseases such as diabetes, human immunodeficiency virus (HIV), acquired immunodeficiency syndrome (AIDS), or kidney disease.  Not drinking enough water.  Having dentures that do not fit right.  Being a child. Children are more  likely than adults to develop oral mucositis, but children usually heal more quickly.  Being elderly. Elderly adults are more likely to develop oral mucositis. SYMPTOMS  Symptoms vary. They may be mild or severe. Symptoms usually show up 7 to 10 days after starting treatment. Symptoms may include:   Sores in the mouth that bleed.  Color changes inside the mouth. Red, shiny areas appear.  White patches or pus in the mouth.  Pain in the mouth and throat.  Pain when talking.  Dryness and a burning feeling in the mouth.  Saliva that is dry and thick.  Trouble eating, drinking, and swallowing.  Weight loss and malnutrition. This happens because eating is a problem. DIAGNOSIS  A caregiver will check your mouth. Then, the condition is given a grade. This grading system will help your caregiver treat your condition:  Grade 1: The inside of the mouth is sore and red.  Grade 2: There is redness in the mouth. Open sores are present. Swallowing food might be uncomfortable.  Grade 3: There are open sores. The mouth is very red. It is very hard to swallow food.  Grade 4: No food or drink can be swallowed. TREATMENT  Oral mucositis usually heals on its own. Sometimes, changes in the cancer treatment can help. Keeping the mouth as clean and germ free as possible is very important.  Medicine may ease the condition. Different types of medicine may be needed, such as:  An antibiotic to fight infection, if present.  Medicine to help mucosal cells heal more quickly.  A water-based moisturizer for your lips,  if they are affected.  Methods to control pain may include:  Keeping your mouth moist. You may suck on ice chips or sugar-free frozen ice pops.  Pain relievers that are swished around in the mouth. They will make the mouth numb to ease the pain (topical anesthetics).  Specific mouth rinses.  Prescribed, medicated gels. The gel coats the mouth. This protects nerve endings and lowers  pain.  Narcotic pain medicines. These are strong drugs. They may be used if pain is very bad.  Mouth care can keep the mouth as healthy as possible and help to prevent infection. Mouth care includes:  A dental checkup. Your dental caregiver will make sure you have no teeth problems that could cause infection. Try to have the dental checkup before you begin your treatment for cancer.  Brushing your teeth several times a day. Use a soft toothbrush. Change to a new brush often. Use only gentle toothpastes. Ask your caregiver what product would be best for you. Make sure that you also floss your teeth.  Rinsing your mouth after every meal. Rinse again at bedtime. Do not use mouthwash that contains alcohol. Ask your caregiver what would be best for you. HOME CARE INSTRUCTIONS  Only take over-the-counter or prescription medicines for pain, discomfort, or fever as directed by your caregiver. Follow the directions carefully.  Do not smoke.  Do not drink alcohol.  Eat only bland, soft foods until your mouth sores heal. Avoid sugary and acidic foods and drinks.  Ask your caregiver if you should add high-protein shakes to your diet to avoid malnutrition and weight loss.  Drink enough fluids to keep your urine clear or pale yellow.  If you have dentures, take them out often.  Continue to check your mouth every day for any signs of oral mucositis.  Keep all follow-up appointments. SEEK MEDICAL CARE IF:  You notice redness, soreness, or dryness in your mouth.  You have mouth or throat pain that makes it hard to swallow or speak. SEEK IMMEDIATE MEDICAL CARE IF:  Your pain in your mouth or throat gets worse and does not improve with pain medicine.  You have a lot of bleeding in your mouth.  You develop new, open sores in your mouth.  You notice patches of pus forming in your mouth.  You cannot swallow solid food or liquids.  You have a fever. Document Released: 01/04/2011 Document  Revised: 08/12/2011 Document Reviewed: 01/04/2011 [facility] Patient Information 2014 [place]. Omeprazole tablets (OTC) What is this medicine? OMEPRAZOLE (oh ME pray zol) prevents the production of acid in the stomach. It is used to treat the symptoms of heartburn. You can buy this medicine without a prescription. This product is not for long-term use, unless otherwise directed by your doctor or health care professional. This medicine may be used for other purposes; ask your health care provider or pharmacist if you have questions. COMMON BRAND NAME(S): Prilosec OTC What should I tell my health care provider before I take this medicine? They need to know if you have any of these conditions: -black or bloody stools -chest pain -difficulty swallowing -have had heartburn for over 3 months -have heartburn with dizziness, lightheadedness or sweating -liver disease -stomach pain -unexplained weight loss -vomiting with blood -wheezing -an unusual or allergic reaction to omeprazole, other medicines, foods, dyes, or preservatives -pregnant or trying to get pregnant -breast-feeding How should I use this medicine? Take this medicine by mouth. Follow the directions on the product label. If you are  taking this medicine without a prescription, take one tablet every day. Do not use for longer than 14 days or repeat a course of treatment more often than every 4 months unless directed by a doctor or healthcare professional. Take your dose at regular intervals every 24 hours. Swallow the tablet whole with a drink of water. Do not crush, break or chew. This medicine works best if taken on an empty stomach 30 minutes before breakfast. If you are using this medicine with the prescription of your doctor or healthcare professional, follow the directions you were given. Do not take your medicine more often than directed. Talk to your pediatrician regarding the use of this medicine in children. Special care may  be needed. Overdosage: If you think you have taken too much of this medicine contact a poison control center or emergency room at once. NOTE: This medicine is only for you. Do not share this medicine with others. What if I miss a dose? If you miss a dose, take it as soon as you can. If it is almost time for your next dose, take only that dose. Do not take double or extra doses. What may interact with this medicine? Do not take this medicine with any of the following medications: -atazanavir -clopidogrel -nelfinavir This medicine may also interact with the following medications: -ampicillin -certain medicines for anxiety or sleep -certain medicines that treat or prevent blood clots like warfarin -cyclosporine -diazepam -digoxin -disulfiram -iron salts -phenytoin -prescription medicine for fungal or yeast infection like itraconazole, ketoconazole, voriconazole -saquinavir -tacrolimus This list may not describe all possible interactions. Give your health care provider a list of all the medicines, herbs, non-prescription drugs, or dietary supplements you use. Also tell them if you smoke, drink alcohol, or use illegal drugs. Some items may interact with your medicine. What should I watch for while using this medicine? It can take several days before your heartburn gets better. Check with your doctor or health care professional if your condition does not start to get better, or if it gets worse. Do not treat diarrhea with over the counter products. Contact your doctor if you have diarrhea that lasts more than 2 days or if it is severe and watery. Do not treat yourself for heartburn with this medicine for more than 14 days in a row. You should only use this medicine for a 2-week treatment period once every 4 months. If your symptoms return shortly after your therapy is complete, or within the 4 month time frame, call your doctor or health care professional. What side effects may I notice from  receiving this medicine? Side effects that you should report to your doctor or health care professional as soon as possible: -allergic reactions like skin rash, itching or hives, swelling of the face, lips, or tongue -bone, muscle or joint pain -breathing problems -chest pain or chest tightness -dark yellow or brown urine -diarrhea -dizziness -fast, irregular heartbeat -feeling faint or lightheaded -fever or sore throat -muscle spasm -palpitations -redness, blistering, peeling or loosening of the skin, including inside the mouth -seizures -tremors -unusual bleeding or bruising -unusually weak or tired -yellowing of the eyes or skin Side effects that usually do not require medical attention (Report these to your doctor or health care professional if they continue or are bothersome.): -constipation -dry mouth -headache -loose stools -nausea This list may not describe all possible side effects. Call your doctor for medical advice about side effects. You may report side effects to FDA at 1-800-FDA-1088. Where  should I keep my medicine? Keep out of the reach of children. Store at room temperature between 20 and 25 degrees C (68 and 77 degrees F). Protect from light and moisture. Throw away any unused medicine after the expiration date. NOTE: This sheet is a summary. It may not cover all possible information. If you have questions about this medicine, talk to your doctor, pharmacist, or health care provider.  2014, Elsevier/Gold Standard. (2011-02-18 11:40:25) Ciprofloxacin tablets What is this medicine? CIPROFLOXACIN (sip roe FLOX a sin) is a quinolone antibiotic. It is used to treat certain kinds of bacterial infections. It will not work for colds, flu, or other viral infections. This medicine may be used for other purposes; ask your health care provider or pharmacist if you have questions. COMMON BRAND NAME(S): Cipro What should I tell my health care provider before I take this  medicine? They need to know if you have any of these conditions: -bone problems -cerebral disease -joint problems -irregular heartbeat -kidney disease -liver disease -myasthenia gravis -seizure disorder -tendon problems -an unusual or allergic reaction to ciprofloxacin, other antibiotics or medicines, foods, dyes, or preservatives -pregnant or trying to get pregnant -breast-feeding How should I use this medicine? Take this medicine by mouth with a glass of water. Follow the directions on the prescription label. Take your medicine at regular intervals. Do not take your medicine more often than directed. Take all of your medicine as directed even if you think your are better. Do not skip doses or stop your medicine early. You can take this medicine with food or on an empty stomach. It can be taken with a meal that contains dairy or calcium, but do not take it alone with a dairy product, like milk or yogurt or calcium-fortified juice. A special MedGuide will be given to you by the pharmacist with each prescription and refill. Be sure to read this information carefully each time. Talk to your pediatrician regarding the use of this medicine in children. Special care may be needed. Overdosage: If you think you have taken too much of this medicine contact a poison control center or emergency room at once. NOTE: This medicine is only for you. Do not share this medicine with others. What if I miss a dose? If you miss a dose, take it as soon as you can. If it is almost time for your next dose, take only that dose. Do not take double or extra doses. What may interact with this medicine? Do not take this medicine with any of the following medications: -cisapride -droperidol -terfenadine -tizanidine This medicine may also interact with the following medications: -antacids -caffeine -cyclosporin -didanosine (ddI) buffered tablets or powder -medicines for diabetes -medicines for inflammation like  ibuprofen, naproxen -methotrexate -multivitamins -omeprazole -phenytoin -probenecid -sucralfate -theophylline -warfarin This list may not describe all possible interactions. Give your health care provider a list of all the medicines, herbs, non-prescription drugs, or dietary supplements you use. Also tell them if you smoke, drink alcohol, or use illegal drugs. Some items may interact with your medicine. What should I watch for while using this medicine? Tell your doctor or health care professional if your symptoms do not improve. Do not treat diarrhea with over the counter products. Contact your doctor if you have diarrhea that lasts more than 2 days or if it is severe and watery. You may get drowsy or dizzy. Do not drive, use machinery, or do anything that needs mental alertness until you know how this medicine affects you. Do  not stand or sit up quickly, especially if you are an older patient. This reduces the risk of dizzy or fainting spells. This medicine can make you more sensitive to the sun. Keep out of the sun. If you cannot avoid being in the sun, wear protective clothing and use sunscreen. Do not use sun lamps or tanning beds/booths. Avoid antacids, aluminum, calcium, iron, magnesium, and zinc products for 6 hours before and 2 hours after taking a dose of this medicine. What side effects may I notice from receiving this medicine? Side effects that you should report to your doctor or health care professional as soon as possible: - allergic reactions like skin rash, itching or hives, swelling of the face, lips, or tongue - breathing problems - confusion, nightmares or hallucinations - feeling faint or lightheaded, falls - irregular heartbeat - joint, muscle or tendon pain or swelling - pain or trouble passing urine -persistent headache with or without blurred vision - redness, blistering, peeling or loosening of the skin, including inside the mouth - seizure - unusual  pain, numbness, tingling, or weakness Side effects that usually do not require medical attention (report to your doctor or health care professional if they continue or are bothersome): - diarrhea - nausea or stomach upset - white patches or sores in the mouth This list may not describe all possible side effects. Call your doctor for medical advice about side effects. You may report side effects to FDA at 1-800-FDA-1088. Where should I keep my medicine? Keep out of the reach of children. Store at room temperature below 30 degrees C (86 degrees F). Keep container tightly closed. Throw away any unused medicine after the expiration date. NOTE: This sheet is a summary. It may not cover all possible information. If you have questions about this medicine, talk to your doctor, pharmacist, or health care provider.  2014, Elsevier/Gold Standard. (2011-06-07 12:53:06)

## 2013-10-06 NOTE — Patient Instructions (Signed)
Dehydration, Adult Dehydration is when you lose more fluids from the body than you take in. Vital organs like the kidneys, brain, and heart cannot function without a proper amount of fluids and salt. Any loss of fluids from the body can cause dehydration.  CAUSES   Vomiting.  Diarrhea.  Excessive sweating.  Excessive urine output.  Fever. SYMPTOMS  Mild dehydration  Thirst.  Dry lips.  Slightly dry mouth. Moderate dehydration  Very dry mouth.  Sunken eyes.  Skin does not bounce back quickly when lightly pinched and released.  Dark urine and decreased urine production.  Decreased tear production.  Headache. Severe dehydration  Very dry mouth.  Extreme thirst.  Rapid, weak pulse (more than 100 beats per minute at rest).  Cold hands and feet.  Not able to sweat in spite of heat and temperature.  Rapid breathing.  Blue lips.  Confusion and lethargy.  Difficulty being awakened.  Minimal urine production.  No tears. DIAGNOSIS  Your caregiver will diagnose dehydration based on your symptoms and your exam. Blood and urine tests will help confirm the diagnosis. The diagnostic evaluation should also identify the cause of dehydration. TREATMENT  Treatment of mild or moderate dehydration can often be done at home by increasing the amount of fluids that you drink. It is best to drink small amounts of fluid more often. Drinking too much at one time can make vomiting worse. Refer to the home care instructions below. Severe dehydration needs to be treated at the hospital where you will probably be given intravenous (IV) fluids that contain water and electrolytes. HOME CARE INSTRUCTIONS   Ask your caregiver about specific rehydration instructions.  Drink enough fluids to keep your urine clear or pale yellow.  Drink small amounts frequently if you have nausea and vomiting.  Eat as you normally do.  Avoid:  Foods or drinks high in sugar.  Carbonated  drinks.  Juice.  Extremely hot or cold fluids.  Drinks with caffeine.  Fatty, greasy foods.  Alcohol.  Tobacco.  Overeating.  Gelatin desserts.  Wash your hands well to avoid spreading bacteria and viruses.  Only take over-the-counter or prescription medicines for pain, discomfort, or fever as directed by your caregiver.  Ask your caregiver if you should continue all prescribed and over-the-counter medicines.  Keep all follow-up appointments with your caregiver. SEEK MEDICAL CARE IF:  You have abdominal pain and it increases or stays in one area (localizes).  You have a rash, stiff neck, or severe headache.  You are irritable, sleepy, or difficult to awaken.  You are weak, dizzy, or extremely thirsty. SEEK IMMEDIATE MEDICAL CARE IF:   You are unable to keep fluids down or you get worse despite treatment.  You have frequent episodes of vomiting or diarrhea.  You have blood or green matter (bile) in your vomit.  You have blood in your stool or your stool looks black and tarry.  You have not urinated in 6 to 8 hours, or you have only urinated a small amount of very dark urine.  You have a fever.  You faint. MAKE SURE YOU:   Understand these instructions.  Will watch your condition.  Will get help right away if you are not doing well or get worse. Document Released: 05/20/2005 Document Revised: 08/12/2011 Document Reviewed: 01/07/2011 [employer] Patient Information 2014 [employer].  

## 2013-10-06 NOTE — Telephone Encounter (Signed)
per pof to sch pt appt/inj-printed copy of sch

## 2013-10-07 ENCOUNTER — Encounter: Payer: Self-pay | Admitting: Adult Health

## 2013-10-07 ENCOUNTER — Telehealth: Payer: Self-pay | Admitting: *Deleted

## 2013-10-07 MED ORDER — CIPROFLOXACIN HCL 500 MG PO TABS: 500.0000 mg | ORAL_TABLET | Freq: Two times a day (BID) | ORAL | Status: DC

## 2013-10-07 NOTE — Telephone Encounter (Signed)
Called pt to verify that she has received Cipro. Pt received antibiotic last night and has been taking medication. Rx of Cipro 500 mg tablet to be taken twice a day. Disp - 14, Refill- 0. Message to be forwarded to Charlestine Massed, NP.

## 2013-10-08 ENCOUNTER — Telehealth: Payer: Self-pay | Admitting: *Deleted

## 2013-10-08 DIAGNOSIS — K1379 Other lesions of oral mucosa: Secondary | ICD-10-CM

## 2013-10-08 DIAGNOSIS — C50919 Malignant neoplasm of unspecified site of unspecified female breast: Secondary | ICD-10-CM

## 2013-10-08 MED ORDER — LIDOCAINE VISCOUS 2 % MT SOLN: 5.0000 mL | Freq: Four times a day (QID) | OROMUCOSAL | Status: DC | PRN

## 2013-10-08 NOTE — Telephone Encounter (Signed)
Patient daughter calling in today to report that patient does have mouth sores developing in left lateral tongue with severe redness, tenderness and white center. She is using standard magic mouthwash and Biotene between. Discussed with Clair Gulling, NP, we will call in Lidocaine 2% solution to add to current supply.

## 2013-10-09 ENCOUNTER — Telehealth: Payer: Self-pay | Admitting: Adult Health

## 2013-10-09 NOTE — Telephone Encounter (Signed)
, °

## 2013-10-11 ENCOUNTER — Ambulatory Visit: Payer: Commercial Managed Care - HMO

## 2013-10-11 ENCOUNTER — Telehealth: Payer: Self-pay | Admitting: Dietician

## 2013-10-11 ENCOUNTER — Other Ambulatory Visit: Payer: Commercial Managed Care - HMO

## 2013-10-11 ENCOUNTER — Telehealth: Payer: Self-pay | Admitting: *Deleted

## 2013-10-11 NOTE — Telephone Encounter (Signed)
Brief Outpatient Oncology Nutrition Note  Patient has been identified to be at risk on malnutrition screen.  Wt Readings from Last 10 Encounters:  10/06/13 155 lb 11.2 oz (70.625 kg)  09/27/13 163 lb 11.2 oz (74.254 kg)  09/24/13 162 lb (73.483 kg)  09/20/13 163 lb 9.6 oz (74.208 kg)  09/14/13 164 lb 12.8 oz (74.753 kg)  09/07/13 164 lb 1.6 oz (74.435 kg)  09/07/13 164 lb 1.6 oz (74.435 kg)  08/31/13 164 lb 1.6 oz (74.435 kg)  08/11/13 165 lb (74.844 kg)  08/10/13 166 lb (75.297 kg)      Dx:  Breast Cancer undergoing chemo  Called patient due to weight loss.  Last week, patient was having many side effects with Nausea, Diarrhea, Mouth Sores, Taste alterations and was unable to eat or drink.  Today, patient reports that mouth sores and taste are 85% better and she is eating very well.  Has not been taking Protein supplements at this time.    [place] RD available as needed.  Antonieta Iba, RD, LDN

## 2013-10-11 NOTE — Telephone Encounter (Signed)
Per staff message I have adjusted appt 

## 2013-10-12 ENCOUNTER — Ambulatory Visit: Payer: Commercial Managed Care - HMO

## 2013-10-12 ENCOUNTER — Encounter: Payer: Self-pay | Admitting: Oncology

## 2013-10-12 NOTE — Progress Notes (Signed)
Advised [facility] we do our pre-auth and we have noted no asst for patient with [employer] based on insurance coverage and over income limit. They had left several messages about sending a pre-auth form to fill out. See Darci Current notes she has approval thru July 2015.

## 2013-10-15 NOTE — Progress Notes (Addendum)
FAX FROM [employer] WITH A PA FORM FOR [employer] HOWEVER I HAD ALREADY DONE IT ONLINE. AS WELL AS AN INSURANCE INVESTIGATION.  IT DOES REQUIRE PRE-CERT.  09/30/13 FAX FROM [employer] STATING THEY WERE STUILL CHECKING BENEFITS FIR [place].  10/12/13 THEY COULDN'T GET ANYTHING OUT OF [employer].  I CALLED AND SPOKE TO [place] HER I HAD GOTTEN THE [place] ID IS [id number]. [place] WAS [phone].

## 2013-10-18 ENCOUNTER — Other Ambulatory Visit (HOSPITAL_BASED_OUTPATIENT_CLINIC_OR_DEPARTMENT_OTHER): Payer: Commercial Managed Care - HMO

## 2013-10-18 ENCOUNTER — Other Ambulatory Visit: Payer: Self-pay | Admitting: Oncology

## 2013-10-18 ENCOUNTER — Ambulatory Visit (HOSPITAL_BASED_OUTPATIENT_CLINIC_OR_DEPARTMENT_OTHER): Payer: Commercial Managed Care - HMO

## 2013-10-18 VITALS — BP 115/64 | HR 72 | Temp 97.4°F | Resp 18

## 2013-10-18 DIAGNOSIS — C50219 Malignant neoplasm of upper-inner quadrant of unspecified female breast: Secondary | ICD-10-CM

## 2013-10-18 DIAGNOSIS — C50211 Malignant neoplasm of upper-inner quadrant of right female breast: Secondary | ICD-10-CM

## 2013-10-18 DIAGNOSIS — Z5111 Encounter for antineoplastic chemotherapy: Secondary | ICD-10-CM

## 2013-10-18 LAB — CBC WITH DIFFERENTIAL/PLATELET
BASO%: 0.7 % (ref 0.0–2.0)
BASOS ABS: 0 10*3/uL (ref 0.0–0.1)
EOS%: 0 % (ref 0.0–7.0)
Eosinophils Absolute: 0 10*3/uL (ref 0.0–0.5)
HCT: 35.3 % (ref 34.8–46.6)
HGB: 11.9 g/dL (ref 11.6–15.9)
LYMPH%: 16.9 % (ref 14.0–49.7)
MCH: 31.2 pg (ref 25.1–34.0)
MCHC: 33.6 g/dL (ref 31.5–36.0)
MCV: 92.9 fL (ref 79.5–101.0)
MONO#: 0.5 10*3/uL (ref 0.1–0.9)
MONO%: 9 % (ref 0.0–14.0)
NEUT#: 4.4 10*3/uL (ref 1.5–6.5)
NEUT%: 73.4 % (ref 38.4–76.8)
Platelets: 309 10*3/uL (ref 145–400)
RBC: 3.8 10*6/uL (ref 3.70–5.45)
RDW: 15.5 % — ABNORMAL HIGH (ref 11.2–14.5)
WBC: 6 10*3/uL (ref 3.9–10.3)
lymph#: 1 10*3/uL (ref 0.9–3.3)

## 2013-10-18 LAB — COMPREHENSIVE METABOLIC PANEL (CC13)
ALT: 12 U/L (ref 0–55)
ANION GAP: 11 meq/L (ref 3–11)
AST: 15 U/L (ref 5–34)
Albumin: 3.7 g/dL (ref 3.5–5.0)
Alkaline Phosphatase: 56 U/L (ref 40–150)
BUN: 13.9 mg/dL (ref 7.0–26.0)
CALCIUM: 9.2 mg/dL (ref 8.4–10.4)
CO2: 24 meq/L (ref 22–29)
CREATININE: 0.8 mg/dL (ref 0.6–1.1)
Chloride: 109 mEq/L (ref 98–109)
Glucose: 110 mg/dl (ref 70–140)
Potassium: 4.1 mEq/L (ref 3.5–5.1)
Sodium: 143 mEq/L (ref 136–145)
Total Bilirubin: 0.21 mg/dL (ref 0.20–1.20)
Total Protein: 6.4 g/dL (ref 6.4–8.3)

## 2013-10-18 MED ORDER — DEXAMETHASONE SODIUM PHOSPHATE 20 MG/5ML IJ SOLN
12.0000 mg | Freq: Once | INTRAMUSCULAR | Status: AC
  Administered 2013-10-18: 12 mg via INTRAVENOUS

## 2013-10-18 MED ORDER — DEXAMETHASONE SODIUM PHOSPHATE 20 MG/5ML IJ SOLN
INTRAMUSCULAR | Status: AC
  Filled 2013-10-18: qty 5

## 2013-10-18 MED ORDER — SODIUM CHLORIDE 0.9 % IJ SOLN
10.0000 mL | INTRAMUSCULAR | Status: DC | PRN
  Administered 2013-10-18: 10 mL
  Filled 2013-10-18: qty 10

## 2013-10-18 MED ORDER — HEPARIN SOD (PORK) LOCK FLUSH 100 UNIT/ML IV SOLN
500.0000 [IU] | Freq: Once | INTRAVENOUS | Status: AC | PRN
  Administered 2013-10-18: 500 [IU]
  Filled 2013-10-18: qty 5

## 2013-10-18 MED ORDER — FLUOROURACIL CHEMO INJECTION 2.5 GM/50ML
500.0000 mg/m2 | Freq: Once | INTRAVENOUS | Status: AC
  Administered 2013-10-18: 950 mg via INTRAVENOUS
  Filled 2013-10-18: qty 19

## 2013-10-18 MED ORDER — SODIUM CHLORIDE 0.9 % IV SOLN
Freq: Once | INTRAVENOUS | Status: AC
  Administered 2013-10-18: 13:00:00 via INTRAVENOUS

## 2013-10-18 MED ORDER — EPIRUBICIN HCL CHEMO IV INJECTION 200 MG/100ML
100.0000 mg/m2 | Freq: Once | INTRAVENOUS | Status: AC
  Administered 2013-10-18: 186 mg via INTRAVENOUS
  Filled 2013-10-18: qty 93

## 2013-10-18 MED ORDER — PALONOSETRON HCL INJECTION 0.25 MG/5ML
INTRAVENOUS | Status: AC
  Filled 2013-10-18: qty 5

## 2013-10-18 MED ORDER — FOSAPREPITANT DIMEGLUMINE INJECTION 150 MG
150.0000 mg | Freq: Once | INTRAVENOUS | Status: AC
  Administered 2013-10-18: 150 mg via INTRAVENOUS
  Filled 2013-10-18: qty 5

## 2013-10-18 MED ORDER — SODIUM CHLORIDE 0.9 % IV SOLN
500.0000 mg/m2 | Freq: Once | INTRAVENOUS | Status: AC
  Administered 2013-10-18: 940 mg via INTRAVENOUS
  Filled 2013-10-18: qty 47

## 2013-10-18 MED ORDER — PALONOSETRON HCL INJECTION 0.25 MG/5ML
0.2500 mg | Freq: Once | INTRAVENOUS | Status: AC
Start: 2013-10-18 — End: 2013-10-18
  Administered 2013-10-18: 0.25 mg via INTRAVENOUS

## 2013-10-18 NOTE — Patient Instructions (Signed)
[place] Discharge Instructions for Patients Receiving Chemotherapy  Today you received the following chemotherapy agents Ellence, 5FU, and Cytoxan.  To help prevent nausea and vomiting after your treatment, we encourage you to take your nausea medication.   If you develop nausea and vomiting that is not controlled by your nausea medication, call the clinic.   BELOW ARE SYMPTOMS THAT SHOULD BE REPORTED IMMEDIATELY:  *FEVER GREATER THAN 100.5 F  *CHILLS WITH OR WITHOUT FEVER  NAUSEA AND VOMITING THAT IS NOT CONTROLLED WITH YOUR NAUSEA MEDICATION  *UNUSUAL SHORTNESS OF BREATH  *UNUSUAL BRUISING OR BLEEDING  TENDERNESS IN MOUTH AND THROAT WITH OR WITHOUT PRESENCE OF ULCERS  *URINARY PROBLEMS  *BOWEL PROBLEMS  UNUSUAL RASH Items with * indicate a potential emergency and should be followed up as soon as possible.  Feel free to call the clinic you have any questions or concerns. The clinic phone number is [phone].

## 2013-10-19 ENCOUNTER — Ambulatory Visit (HOSPITAL_BASED_OUTPATIENT_CLINIC_OR_DEPARTMENT_OTHER): Payer: Commercial Managed Care - HMO

## 2013-10-19 ENCOUNTER — Other Ambulatory Visit: Payer: Self-pay | Admitting: Physician Assistant

## 2013-10-19 ENCOUNTER — Other Ambulatory Visit: Payer: Self-pay | Admitting: Oncology

## 2013-10-19 VITALS — BP 121/51 | HR 73 | Temp 97.7°F

## 2013-10-19 DIAGNOSIS — C50219 Malignant neoplasm of upper-inner quadrant of unspecified female breast: Secondary | ICD-10-CM

## 2013-10-19 DIAGNOSIS — C50919 Malignant neoplasm of unspecified site of unspecified female breast: Secondary | ICD-10-CM

## 2013-10-19 DIAGNOSIS — C50211 Malignant neoplasm of upper-inner quadrant of right female breast: Secondary | ICD-10-CM

## 2013-10-19 DIAGNOSIS — D702 Other drug-induced agranulocytosis: Secondary | ICD-10-CM

## 2013-10-19 MED ORDER — PEGFILGRASTIM INJECTION 6 MG/0.6ML
6.0000 mg | Freq: Once | SUBCUTANEOUS | Status: AC
  Administered 2013-10-19: 6 mg via SUBCUTANEOUS
  Filled 2013-10-19: qty 0.6

## 2013-10-26 ENCOUNTER — Telehealth: Payer: Self-pay | Admitting: *Deleted

## 2013-10-26 ENCOUNTER — Ambulatory Visit: Payer: Commercial Managed Care - HMO

## 2013-10-26 ENCOUNTER — Other Ambulatory Visit: Payer: Commercial Managed Care - HMO

## 2013-10-26 ENCOUNTER — Other Ambulatory Visit: Payer: Self-pay | Admitting: *Deleted

## 2013-10-26 ENCOUNTER — Ambulatory Visit (HOSPITAL_BASED_OUTPATIENT_CLINIC_OR_DEPARTMENT_OTHER): Payer: Medicare HMO | Admitting: Adult Health

## 2013-10-26 ENCOUNTER — Other Ambulatory Visit (HOSPITAL_BASED_OUTPATIENT_CLINIC_OR_DEPARTMENT_OTHER): Payer: Commercial Managed Care - HMO

## 2013-10-26 ENCOUNTER — Telehealth: Payer: Self-pay | Admitting: Adult Health

## 2013-10-26 ENCOUNTER — Ambulatory Visit (HOSPITAL_BASED_OUTPATIENT_CLINIC_OR_DEPARTMENT_OTHER): Payer: Commercial Managed Care - HMO

## 2013-10-26 ENCOUNTER — Encounter: Payer: Self-pay | Admitting: Adult Health

## 2013-10-26 VITALS — BP 113/72 | HR 86 | Temp 97.7°F | Resp 18 | Ht 66.0 in | Wt 153.6 lb

## 2013-10-26 DIAGNOSIS — C50211 Malignant neoplasm of upper-inner quadrant of right female breast: Secondary | ICD-10-CM

## 2013-10-26 DIAGNOSIS — C50919 Malignant neoplasm of unspecified site of unspecified female breast: Secondary | ICD-10-CM

## 2013-10-26 DIAGNOSIS — R5383 Other fatigue: Secondary | ICD-10-CM

## 2013-10-26 DIAGNOSIS — R5381 Other malaise: Secondary | ICD-10-CM

## 2013-10-26 DIAGNOSIS — D702 Other drug-induced agranulocytosis: Secondary | ICD-10-CM

## 2013-10-26 DIAGNOSIS — Z171 Estrogen receptor negative status [ER-]: Secondary | ICD-10-CM

## 2013-10-26 DIAGNOSIS — R110 Nausea: Secondary | ICD-10-CM

## 2013-10-26 DIAGNOSIS — E860 Dehydration: Secondary | ICD-10-CM

## 2013-10-26 LAB — COMPREHENSIVE METABOLIC PANEL (CC13)
ALT: 17 U/L (ref 0–55)
ANION GAP: 10 meq/L (ref 3–11)
AST: 14 U/L (ref 5–34)
Albumin: 4 g/dL (ref 3.5–5.0)
Alkaline Phosphatase: 66 U/L (ref 40–150)
BUN: 13.4 mg/dL (ref 7.0–26.0)
CHLORIDE: 100 meq/L (ref 98–109)
CO2: 26 meq/L (ref 22–29)
CREATININE: 0.8 mg/dL (ref 0.6–1.1)
Calcium: 9.6 mg/dL (ref 8.4–10.4)
Glucose: 106 mg/dl (ref 70–140)
Potassium: 4.1 mEq/L (ref 3.5–5.1)
Sodium: 136 mEq/L (ref 136–145)
TOTAL PROTEIN: 6.6 g/dL (ref 6.4–8.3)
Total Bilirubin: 0.62 mg/dL (ref 0.20–1.20)

## 2013-10-26 LAB — CBC WITH DIFFERENTIAL/PLATELET
BASO%: 3.1 % — ABNORMAL HIGH (ref 0.0–2.0)
Basophils Absolute: 0 10*3/uL (ref 0.0–0.1)
EOS%: 4.6 % (ref 0.0–7.0)
Eosinophils Absolute: 0 10*3/uL (ref 0.0–0.5)
HEMATOCRIT: 36.3 % (ref 34.8–46.6)
HGB: 12.5 g/dL (ref 11.6–15.9)
LYMPH%: 83.1 % — AB (ref 14.0–49.7)
MCH: 31.2 pg (ref 25.1–34.0)
MCHC: 34.4 g/dL (ref 31.5–36.0)
MCV: 90.5 fL (ref 79.5–101.0)
MONO#: 0 10*3/uL — AB (ref 0.1–0.9)
MONO%: 3.1 % (ref 0.0–14.0)
NEUT%: 6.1 % — ABNORMAL LOW (ref 38.4–76.8)
NEUTROS ABS: 0 10*3/uL — AB (ref 1.5–6.5)
NRBC: 0 % (ref 0–0)
PLATELETS: 117 10*3/uL — AB (ref 145–400)
RBC: 4.01 10*6/uL (ref 3.70–5.45)
RDW: 13.7 % (ref 11.2–14.5)
WBC: 0.7 10*3/uL — CL (ref 3.9–10.3)
lymph#: 0.5 10*3/uL — ABNORMAL LOW (ref 0.9–3.3)

## 2013-10-26 MED ORDER — CIPROFLOXACIN HCL 500 MG PO TABS: 500.0000 mg | ORAL_TABLET | Freq: Two times a day (BID) | ORAL | Status: DC

## 2013-10-26 MED ORDER — ONDANSETRON 16 MG/50ML IVPB (CHCC)
16.0000 mg | Freq: Once | INTRAVENOUS | Status: AC
  Administered 2013-10-26: 16 mg via INTRAVENOUS

## 2013-10-26 MED ORDER — ONDANSETRON 16 MG/50ML IVPB (CHCC): 16.0000 mg | Freq: Once | INTRAVENOUS | Status: DC

## 2013-10-26 MED ORDER — SODIUM CHLORIDE 0.9 % IV SOLN
Freq: Once | INTRAVENOUS | Status: AC
  Administered 2013-10-26: 15:00:00 via INTRAVENOUS

## 2013-10-26 MED ORDER — SODIUM CHLORIDE 0.9 % IV SOLN: Freq: Once | INTRAVENOUS | Status: DC

## 2013-10-26 MED ORDER — ONDANSETRON 16 MG/50ML IVPB (CHCC)
INTRAVENOUS | Status: AC
  Filled 2013-10-26: qty 16

## 2013-10-26 NOTE — Patient Instructions (Signed)
  Patient Neutropenia Instruction Sheet  Diagnosis: Breast Cancer      Treating Physician: Marcy Panning, MD  Treatment: 1. Type of chemotherapy: FEC 2. Date of last treatment: 10/18/13  Last Blood Counts: Lab Results  Component Value Date   WBC 0.7* 10/26/2013   HGB 12.5 10/26/2013   HCT 36.3 10/26/2013   MCV 90.5 10/26/2013   PLT 117* 10/26/2013  ANC 0      Prophylactic Antibiotics: Cipro 500 mg by mouth twice a day Instructions: 1. Monitor temperature and call if fever  greater than 100.5, chills, shaking chills (rigors) 2. Call Physician on-call at [phone] 3. Give him/her symptoms and list of medications that you are taking and your last blood count.

## 2013-10-26 NOTE — Patient Instructions (Signed)
Dehydration, Adult Dehydration is when you lose more fluids from the body than you take in. Vital organs like the kidneys, brain, and heart cannot function without a proper amount of fluids and salt. Any loss of fluids from the body can cause dehydration.  CAUSES   Vomiting.  Diarrhea.  Excessive sweating.  Excessive urine output.  Fever. SYMPTOMS  Mild dehydration  Thirst.  Dry lips.  Slightly dry mouth. Moderate dehydration  Very dry mouth.  Sunken eyes.  Skin does not bounce back quickly when lightly pinched and released.  Dark urine and decreased urine production.  Decreased tear production.  Headache. Severe dehydration  Very dry mouth.  Extreme thirst.  Rapid, weak pulse (more than 100 beats per minute at rest).  Cold hands and feet.  Not able to sweat in spite of heat and temperature.  Rapid breathing.  Blue lips.  Confusion and lethargy.  Difficulty being awakened.  Minimal urine production.  No tears. DIAGNOSIS  Your caregiver will diagnose dehydration based on your symptoms and your exam. Blood and urine tests will help confirm the diagnosis. The diagnostic evaluation should also identify the cause of dehydration. TREATMENT  Treatment of mild or moderate dehydration can often be done at home by increasing the amount of fluids that you drink. It is best to drink small amounts of fluid more often. Drinking too much at one time can make vomiting worse. Refer to the home care instructions below. Severe dehydration needs to be treated at the hospital where you will probably be given intravenous (IV) fluids that contain water and electrolytes. HOME CARE INSTRUCTIONS   Ask your caregiver about specific rehydration instructions.  Drink enough fluids to keep your urine clear or pale yellow.  Drink small amounts frequently if you have nausea and vomiting.  Eat as you normally do.  Avoid:  Foods or drinks high in sugar.  Carbonated  drinks.  Juice.  Extremely hot or cold fluids.  Drinks with caffeine.  Fatty, greasy foods.  Alcohol.  Tobacco.  Overeating.  Gelatin desserts.  Wash your hands well to avoid spreading bacteria and viruses.  Only take over-the-counter or prescription medicines for pain, discomfort, or fever as directed by your caregiver.  Ask your caregiver if you should continue all prescribed and over-the-counter medicines.  Keep all follow-up appointments with your caregiver. SEEK MEDICAL CARE IF:  You have abdominal pain and it increases or stays in one area (localizes).  You have a rash, stiff neck, or severe headache.  You are irritable, sleepy, or difficult to awaken.  You are weak, dizzy, or extremely thirsty. SEEK IMMEDIATE MEDICAL CARE IF:   You are unable to keep fluids down or you get worse despite treatment.  You have frequent episodes of vomiting or diarrhea.  You have blood or green matter (bile) in your vomit.  You have blood in your stool or your stool looks black and tarry.  You have not urinated in 6 to 8 hours, or you have only urinated a small amount of very dark urine.  You have a fever.  You faint. MAKE SURE YOU:   Understand these instructions.  Will watch your condition.  Will get help right away if you are not doing well or get worse. Document Released: 05/20/2005 Document Revised: 08/12/2011 Document Reviewed: 01/07/2011 [employer] Patient Information 2014 [employer].  

## 2013-10-26 NOTE — Telephone Encounter (Signed)
, °

## 2013-10-26 NOTE — Progress Notes (Signed)
ID: Wendy [name] OB: 1946-04-05  MR#: [id number]  CSN#:[id number]  PCP: PROVIDER NOT IN SYSTEM GYN:   SU: Dr. Dalbert Batman OTHER MD:  CHIEF COMPLAINT:[place] woman with T1 N0, stage IA, invasive ductal carcinoma, grade 2, ER negative, PR negative, Ki-67 of 13%, HER-2/neu negative.      BREAST CANCER HISTORY: Patient went for an annual mammogram and she was found to have an abnormality in the right breast at the 3:00 position. Subsequently had a right diagnostic mammogram performed. She also had a image guided biopsy of the right breast at the 3:00 position. 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 The tumor was ER negative PR negative HER-2/neu negative with a proliferation marker Ki-67 13%. All of her workup initially was performed in [place]. She subsequently was referred to Dr. Fanny Skates who saw her on 08/04/2003. He has recommended MRI of the breasts. This was performed on 08/09/2013. In the right breast there was an irregular enhancing mass in the 3:00 region measuring 1.5 x 1.0 x 1.4 cm. Also noted was nodular segmental enhancement extending 6.1 cm posteriorly from the mass towards the chest wall. The mass and nodular enhancement measured 7 cm. In the middle third of the lower outer quadrant of the right breast there was a 1.0 x 0.7 x 0.7 cm area of focal non-masslike enhancement. Left breast revealed no masses or abnormal enhancements. Lymph nodes appeared normal.  CURRENT THERAPY: Fluorouracil, Epirubicin, Cyclophosphamide cycle 2 day 8  INTERVAL HISTORY:  Wendy [name] is here today for evaluation after receiving cycle 2 of FEC chemotherapy.  She is currently cycle 2 day 8 of therapy.  It is given on day 1 of a 21 day cycle with Neulasta given on day 2 for granulocyte support.    [name] is not feeling well today at all.  She received her treatment last week and has taken her anti-emetics as directed.  She has not had "nausea" but she has had a  revolting feeling whenever she tries to eat.  Her husband is concerned that she is taking in 200 calories per day and that she isn't drinking a lot of fluids.  She is fatigued as well and this started on Sunday.  She denies fevers, chills, vomiting, constipation, diarrhea, numbness/tingling, skin changes, or any further concerns.   REVIEW OF SYSTEMS: A 10 point review of systems was conducted and is otherwise negative except for what is noted above.    PAST MEDICAL HISTORY: Past Medical History  Diagnosis Date  . Family history of anesthesia complication     "daughter PONV; long time in recovery" (09/07/2013)  . Asthma     as a child  . Asthmatic bronchitis     "about q yr" (09/07/2013)  . Pneumonia     "several times; usually follows asthmatic bronchitis" (09/07/2013)  . Sinus headache     "maybe 2-3 times/month"  . Breast cancer     "right breast"     PAST SURGICAL HISTORY: Past Surgical History  Procedure Laterality Date  . Tubal ligation  1977  . Finger fracture surgery Left 2010    "middle finger spiral fx; little finger broken; tissue damage"  . Tonsillectomy    . Portacath placement Right 09/07/2013  . Mastectomy complete / simple w/ sentinel node biopsy Right 09/07/2013  . Mastectomy complete / simple  09/07/2013  . Breast biopsy Right 07/2013  . Dilation and curettage of uterus      S/P miscarriage  .  Wrist fracture surgery Left 2010  . Mastectomy w/ sentinel node biopsy Bilateral 09/07/2013    Procedure: BILATERAL MASTECTOMY WITH RIGHT SENTINEL LYMPH NODE BIOPSY;  Surgeon: [name], MD;  Location: [facility];  Service: General;  Laterality: Bilateral;  . Portacath placement Right 09/07/2013    Procedure: INSERTION PORT-A-CATH;  Surgeon: [name], MD;  Location: [facility];  Service: General;  Laterality: Right;    FAMILY HISTORY Family History  Problem Relation Age of Onset  . Bladder Cancer Sister 74    worked in [employer], around 2nd hand smoke all her life  . Diabetes  Sister   . Colon cancer Brother 51    non-smoker  . Diabetes Brother   . Colon cancer Maternal Uncle 47  . Osteoporosis Mother   . Parkinson's disease Mother   . Diabetes Mother   . Rheum arthritis Father   . Diabetes Father   . Breast cancer Paternal Aunt     dx over 1  . Diabetes Maternal Grandmother   . Diabetes Maternal Grandfather   . Uterine cancer Other 50  . Colon cancer Maternal Aunt     dx in her 21s-70s  . Breast cancer Cousin     paternal cousin dx in her 56s-70s    GYNECOLOGIC HISTORY: menarche at age 45, G26, P2, patient took HRT for 4-5 years post menopause without problems, no h/o abnormal pap smears or sexually transmitted infections.    SOCIAL HISTORY:  Lives with husband of 25 years.  Patient is retired.  No tobacco, ETOH, illicit drug use.    ADVANCED DIRECTIVES:  In place, HCPOA husband [name] who can be reached at [phone].     HEALTH MAINTENANCE: History  Substance Use Topics  . Smoking status: Never Smoker   . Smokeless tobacco: Never Used  . Alcohol Use: No      Mammogram: 07/2013 Colonoscopy: 2013 Bone Density Scan: 08/2013 Pap Smear: 2013 Eye Exam: 2012 Vitamin D Level: unknown Lipid Panel: patient unsure likely 3-4 years ago   No Known Allergies  Current Outpatient Prescriptions  Medication Sig Dispense Refill  . Alum & Mag Hydroxide-Simeth (MAGIC MOUTHWASH) SOLN Take 5 mLs by mouth 4 (four) times daily as needed for mouth pain.  240 mL  0  . Calcium Carbonate-Vitamin D (CALCIUM PLUS VITAMIN D PO) Take 1 tablet by mouth 2 (two) times daily.      . ciprofloxacin (CIPRO) 500 MG tablet Take 1 tablet (500 mg total) by mouth 2 (two) times daily.  14 tablet  0  . dexamethasone (DECADRON) 4 MG tablet Take 2 tablets by mouth once a day on the day after chemotherapy and then take 2 tablets two times a day for 2 days. Take with food.  30 tablet  1  . lidocaine (XYLOCAINE) 2 % solution Use as directed 5 mLs in the mouth or throat 4 (four)  times daily as needed for mouth pain (use with current supply of MMW).  120 mL  1  . lidocaine-prilocaine (EMLA) cream Apply 1 application topically as needed.  30 g  1  . LORazepam (ATIVAN) 0.5 MG tablet TAKE ONE TABLET EVERY 6 HOURS AS NEEDED FOR NAUSEA AND VOMITING  30 tablet  0  . omeprazole (PRILOSEC) 40 MG capsule Take 1 capsule (40 mg total) by mouth daily.  30 capsule  5  . ondansetron (ZOFRAN) 8 MG tablet Take 1 tablet (8 mg total) by mouth 2 (two) times daily as needed. Start on the third  day after chemotherapy.  30 tablet  1  . prochlorperazine (COMPAZINE) 10 MG tablet Take 1 tablet (10 mg total) by mouth every 6 (six) hours as needed (Nausea or vomiting).  30 tablet  1  . UNABLE TO FIND [id number] Post mastectomy camisole QTY: 2  Diagnosis Code 174.9  2 each  3   No current facility-administered medications for this visit.    OBJECTIVE: Filed Vitals:   10/26/13 1345  BP: 113/72  Pulse: 86  Temp: 97.7 F (36.5 C)  Resp: 18     Body mass index is 24.8 kg/(m^2).     GENERAL: Patient is a well appearing female in no acute distress HEENT:  Sclerae anicteric.  Oropharynx clear and moist. No ulcerations or evidence of oropharyngeal candidiasis. Neck is supple.  NODES:  No cervical, supraclavicular, or axillary lymphadenopathy palpated.  BREAST EXAM:  Deferred. LUNGS:  Clear to auscultation bilaterally.  No wheezes or rhonchi. HEART:  Regular rate and rhythm. No murmur appreciated. ABDOMEN:  Soft, nontender.  Positive, normoactive bowel sounds. No organomegaly palpated. MSK:  No focal spinal tenderness to palpation. Full range of motion bilaterally in the upper extremities. EXTREMITIES:  No peripheral edema.   SKIN:  Clear with no obvious rashes or skin changes. No nail dyscrasia. NEURO:  Nonfocal. Well oriented.  Appropriate affect. ECOG FS:2 - Symptomatic, <50% confined to bed  LAB RESULTS:  CMP     Component Value Date/Time   NA 143 10/18/2013 1210   NA 141 08/31/2013 1038    K 4.1 10/18/2013 1210   K 4.7 08/31/2013 1038   CL 101 08/31/2013 1038   CO2 24 10/18/2013 1210   CO2 27 08/31/2013 1038   GLUCOSE 110 10/18/2013 1210   GLUCOSE 99 08/31/2013 1038   BUN 13.9 10/18/2013 1210   BUN 18 08/31/2013 1038   CREATININE 0.8 10/18/2013 1210   CREATININE 0.75 09/07/2013 1652   CALCIUM 9.2 10/18/2013 1210   CALCIUM 9.8 08/31/2013 1038   PROT 6.4 10/18/2013 1210   PROT 7.4 08/31/2013 1038   ALBUMIN 3.7 10/18/2013 1210   ALBUMIN 4.0 08/31/2013 1038   AST 15 10/18/2013 1210   AST 22 08/31/2013 1038   ALT 12 10/18/2013 1210   ALT 17 08/31/2013 1038   ALKPHOS 56 10/18/2013 1210   ALKPHOS 70 08/31/2013 1038   BILITOT 0.21 10/18/2013 1210   BILITOT 0.3 08/31/2013 1038   GFRNONAA 84* 09/07/2013 1652   GFRAA >90 09/07/2013 1652    I No results found for this basename: SPEP, UPEP,  kappa and lambda light chains    Lab Results  Component Value Date   WBC 0.7* 10/26/2013   NEUTROABS 0.0* 10/26/2013   HGB 12.5 10/26/2013   HCT 36.3 10/26/2013   MCV 90.5 10/26/2013   PLT 117* 10/26/2013      Chemistry      Component Value Date/Time   NA 143 10/18/2013 1210   NA 141 08/31/2013 1038   K 4.1 10/18/2013 1210   K 4.7 08/31/2013 1038   CL 101 08/31/2013 1038   CO2 24 10/18/2013 1210   CO2 27 08/31/2013 1038   BUN 13.9 10/18/2013 1210   BUN 18 08/31/2013 1038   CREATININE 0.8 10/18/2013 1210   CREATININE 0.75 09/07/2013 1652      Component Value Date/Time   CALCIUM 9.2 10/18/2013 1210   CALCIUM 9.8 08/31/2013 1038   ALKPHOS 56 10/18/2013 1210   ALKPHOS 70 08/31/2013 1038   AST 15 10/18/2013 1210   AST  22 08/31/2013 1038   ALT 12 10/18/2013 1210   ALT 17 08/31/2013 1038   BILITOT 0.21 10/18/2013 1210   BILITOT 0.3 08/31/2013 1038       No results found for this basename: LABCA2    No components found with this basename: LABCA125    No results found for this basename: INR,  in the last 168 hours  Urinalysis    Component Value Date/Time   COLORURINE YELLOW 08/31/2013 [place]  08/31/2013 1018   LABSPEC 1.007 08/31/2013 1018   PHURINE 6.5 08/31/2013 1018   GLUCOSEU NEGATIVE 08/31/2013 1018   HGBUR NEGATIVE 08/31/2013 1018   BILIRUBINUR NEGATIVE 08/31/2013 1018   KETONESUR NEGATIVE 08/31/2013 1018   PROTEINUR NEGATIVE 08/31/2013 1018   UROBILINOGEN 0.2 08/31/2013 1018   NITRITE NEGATIVE 08/31/2013 1018   LEUKOCYTESUR NEGATIVE 08/31/2013 1018    STUDIES: No results found.  ASSESSMENT: 68 y.o. [place] woman with T1 N0, stage IA, invasive ductal carcinoma, grade 2, ER negative, PR negative, Ki-67 of 13%, HER-2/neu negative.    1. The patient underwent bilateral mastectomy by Dr. Dalbert Batman on 09/07/2013, and a right port-a-cath was also placed at that time. Pathology revealed a grade 2, 1.7 cm invasive ductal carcinoma. Two lymph nodes were negative for malignancy. The tumor was ER negative, PR negative, HER-2/neu negative. Ki-67 was 13%.   2. Adjuvant Fluorouracil, Epirubicin, and Cyclophosphamide given on day 1 of a 21 day cycle with Neulasta given as granulocyte support was started on 09/27/13. A total of 6 cycles are planned.   PLAN:   Mrs. Manwarren is not feeling well today.  She is subsequently neutropenic following chemotherapy.  She will need to take Cipro BID.  I provided her with detailed neutropenic instructions in her AVS.  She verbalized understanding.    The patient will receive IV fluids and anti-emetics today for her constant state of nausea.  The patient's husband is interested in her taking Marinol for the nauseous feeling.  I discussed this with the patient. The patient has declined taking Marinol at this time, but may consider it after her third cycle of chemotherapy.  She does have a scheduled appointment with Dory Peru in nutrition on 11/08/13.    The patient will return in 2 weeks for labs, evaluation, and cycle 3 of FEC chemotherapy.   She knows to call us in the interim for any questions or concerns.  We can certainly see her sooner if needed.  I spent 25  minutes counseling the patient face to face.  The total time spent in the appointment was 30 minutes.  Minette Headland, [place] [phone] 10/26/2013 1:55 PM

## 2013-10-26 NOTE — Telephone Encounter (Signed)
Per staff message and POF I have scheduled appts.  JMW  

## 2013-10-27 ENCOUNTER — Ambulatory Visit: Payer: Commercial Managed Care - HMO

## 2013-10-28 ENCOUNTER — Encounter: Payer: Self-pay | Admitting: Oncology

## 2013-10-28 NOTE — Progress Notes (Signed)
Corrected means we changed something on the claim, with this one we corrected the provider from [facility] to [facility] and resubmitted the claim, it is still processing with [employer]. Per billing in reference to [id number].

## 2013-11-01 ENCOUNTER — Ambulatory Visit (INDEPENDENT_AMBULATORY_CARE_PROVIDER_SITE_OTHER): Payer: Commercial Managed Care - HMO | Admitting: General Surgery

## 2013-11-01 ENCOUNTER — Encounter (INDEPENDENT_AMBULATORY_CARE_PROVIDER_SITE_OTHER): Payer: Self-pay | Admitting: General Surgery

## 2013-11-01 VITALS — BP 105/55 | HR 76 | Temp 98.2°F | Resp 14 | Ht 66.5 in | Wt 159.2 lb

## 2013-11-01 DIAGNOSIS — C50219 Malignant neoplasm of upper-inner quadrant of unspecified female breast: Secondary | ICD-10-CM

## 2013-11-01 DIAGNOSIS — C50211 Malignant neoplasm of upper-inner quadrant of right female breast: Secondary | ICD-10-CM

## 2013-11-01 NOTE — Patient Instructions (Addendum)
You have recovered from your bilateral mastectomies without any obvious surgical complication.  There is no evidence of any infection, fluid, blood clot, or recurrent cancer. The range of motion of your shoulders is excellent.  I encourage you to exercise daily, as much as you can do.  Return to see Dr. Dalbert Batman in December

## 2013-11-01 NOTE — Progress Notes (Signed)
Patient ID: Wendy [name], female   DOB: Oct 02, 1945, 68 y.o.   MRN: [id number] History: This patient underwent bilateral total mastectomies and a right sentinel lymph node biopsy on 09/07/2013. Final pathology report shows left breast to be benign and the right breast to have a 1.7 cm invasive carcinoma, node negative, triple negative breast cancer, stage T1c, N0.  Marland Kitchen The Port-A-Cath is working well. She has started her chemotherapy. She has full range of motion of her shoulders and doesn't have any complaint about her arms or her shoulders. She denies arm swelling or numbness.  Exam: Patient looks well. Palpation noted. Neck without adenopathy or mass bilateral mastectomy skin flaps looked very good. Healthy skin. No fluid or hematoma. No nodules or ulceration. No axillary mass. Range of motion her shoulders is 180 abduction. 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    Edsel Petrin. Dalbert Batman, M.D., [facility] General and Minimally invasive Surgery Breast and Colorectal Surgery Office:   [phone] Pager:   [phone]

## 2013-11-08 ENCOUNTER — Telehealth: Payer: Self-pay | Admitting: *Deleted

## 2013-11-08 ENCOUNTER — Ambulatory Visit: Payer: Commercial Managed Care - HMO | Admitting: Nutrition

## 2013-11-08 ENCOUNTER — Other Ambulatory Visit: Payer: Self-pay | Admitting: Oncology

## 2013-11-08 ENCOUNTER — Other Ambulatory Visit: Payer: Self-pay | Admitting: Adult Health

## 2013-11-08 ENCOUNTER — Ambulatory Visit (HOSPITAL_BASED_OUTPATIENT_CLINIC_OR_DEPARTMENT_OTHER): Payer: Commercial Managed Care - HMO

## 2013-11-08 ENCOUNTER — Other Ambulatory Visit: Payer: Commercial Managed Care - HMO

## 2013-11-08 ENCOUNTER — Ambulatory Visit (HOSPITAL_BASED_OUTPATIENT_CLINIC_OR_DEPARTMENT_OTHER): Payer: Commercial Managed Care - HMO | Admitting: Adult Health

## 2013-11-08 ENCOUNTER — Encounter: Payer: Self-pay | Admitting: Adult Health

## 2013-11-08 ENCOUNTER — Other Ambulatory Visit (HOSPITAL_BASED_OUTPATIENT_CLINIC_OR_DEPARTMENT_OTHER): Payer: Commercial Managed Care - HMO

## 2013-11-08 VITALS — BP 100/67 | HR 69 | Temp 98.2°F | Resp 18 | Ht 66.0 in | Wt 160.3 lb

## 2013-11-08 DIAGNOSIS — Z171 Estrogen receptor negative status [ER-]: Secondary | ICD-10-CM

## 2013-11-08 DIAGNOSIS — C50211 Malignant neoplasm of upper-inner quadrant of right female breast: Secondary | ICD-10-CM

## 2013-11-08 DIAGNOSIS — C50919 Malignant neoplasm of unspecified site of unspecified female breast: Secondary | ICD-10-CM

## 2013-11-08 DIAGNOSIS — Z5111 Encounter for antineoplastic chemotherapy: Secondary | ICD-10-CM | POA: Diagnosis not present

## 2013-11-08 DIAGNOSIS — C50219 Malignant neoplasm of upper-inner quadrant of unspecified female breast: Secondary | ICD-10-CM

## 2013-11-08 DIAGNOSIS — K30 Functional dyspepsia: Secondary | ICD-10-CM

## 2013-11-08 LAB — CBC WITH DIFFERENTIAL/PLATELET
BASO%: 0.8 % (ref 0.0–2.0)
Basophils Absolute: 0 10*3/uL (ref 0.0–0.1)
EOS%: 0.5 % (ref 0.0–7.0)
Eosinophils Absolute: 0 10*3/uL (ref 0.0–0.5)
HEMATOCRIT: 33.4 % — AB (ref 34.8–46.6)
HGB: 11 g/dL — ABNORMAL LOW (ref 11.6–15.9)
LYMPH%: 23.5 % (ref 14.0–49.7)
MCH: 31.4 pg (ref 25.1–34.0)
MCHC: 32.9 g/dL (ref 31.5–36.0)
MCV: 95.4 fL (ref 79.5–101.0)
MONO#: 0.5 10*3/uL (ref 0.1–0.9)
MONO%: 12.9 % (ref 0.0–14.0)
NEUT#: 2.5 10*3/uL (ref 1.5–6.5)
NEUT%: 62.3 % (ref 38.4–76.8)
PLATELETS: 198 10*3/uL (ref 145–400)
RBC: 3.5 10*6/uL — AB (ref 3.70–5.45)
RDW: 16.5 % — ABNORMAL HIGH (ref 11.2–14.5)
WBC: 4 10*3/uL (ref 3.9–10.3)
lymph#: 0.9 10*3/uL (ref 0.9–3.3)

## 2013-11-08 LAB — COMPREHENSIVE METABOLIC PANEL (CC13)
ALK PHOS: 55 U/L (ref 40–150)
ALT: 15 U/L (ref 0–55)
ANION GAP: 8 meq/L (ref 3–11)
AST: 16 U/L (ref 5–34)
Albumin: 3.6 g/dL (ref 3.5–5.0)
BUN: 12.4 mg/dL (ref 7.0–26.0)
CO2: 27 mEq/L (ref 22–29)
Calcium: 9.1 mg/dL (ref 8.4–10.4)
Chloride: 107 mEq/L (ref 98–109)
Creatinine: 0.8 mg/dL (ref 0.6–1.1)
Glucose: 87 mg/dl (ref 70–140)
Potassium: 4.5 mEq/L (ref 3.5–5.1)
SODIUM: 143 meq/L (ref 136–145)
TOTAL PROTEIN: 6.3 g/dL — AB (ref 6.4–8.3)
Total Bilirubin: 0.22 mg/dL (ref 0.20–1.20)

## 2013-11-08 MED ORDER — SODIUM CHLORIDE 0.9 % IJ SOLN
10.0000 mL | INTRAMUSCULAR | Status: DC | PRN
  Administered 2013-11-08: 10 mL
  Filled 2013-11-08: qty 10

## 2013-11-08 MED ORDER — HEPARIN SOD (PORK) LOCK FLUSH 100 UNIT/ML IV SOLN
500.0000 [IU] | Freq: Once | INTRAVENOUS | Status: AC | PRN
  Administered 2013-11-08: 500 [IU]
  Filled 2013-11-08: qty 5

## 2013-11-08 MED ORDER — FLUOROURACIL CHEMO INJECTION 2.5 GM/50ML
500.0000 mg/m2 | Freq: Once | INTRAVENOUS | Status: AC
  Administered 2013-11-08: 950 mg via INTRAVENOUS
  Filled 2013-11-08: qty 19

## 2013-11-08 MED ORDER — SODIUM CHLORIDE 0.9 % IV SOLN
150.0000 mg | Freq: Once | INTRAVENOUS | Status: AC
  Administered 2013-11-08: 150 mg via INTRAVENOUS
  Filled 2013-11-08: qty 5

## 2013-11-08 MED ORDER — SODIUM CHLORIDE 0.9 % IV SOLN
Freq: Once | INTRAVENOUS | Status: AC
Start: 2013-11-08 — End: 2013-11-08
  Administered 2013-11-08: 12:00:00 via INTRAVENOUS

## 2013-11-08 MED ORDER — SODIUM CHLORIDE 0.9 % IV SOLN
500.0000 mg/m2 | Freq: Once | INTRAVENOUS | Status: AC
  Administered 2013-11-08: 940 mg via INTRAVENOUS
  Filled 2013-11-08: qty 47

## 2013-11-08 MED ORDER — OMEPRAZOLE 40 MG PO CPDR: 40.0000 mg | DELAYED_RELEASE_CAPSULE | Freq: Every day | ORAL | Status: DC

## 2013-11-08 MED ORDER — EPIRUBICIN HCL CHEMO IV INJECTION 200 MG/100ML
100.0000 mg/m2 | Freq: Once | INTRAVENOUS | Status: AC
  Administered 2013-11-08: 186 mg via INTRAVENOUS
  Filled 2013-11-08: qty 93

## 2013-11-08 MED ORDER — DEXAMETHASONE 4 MG PO TABS: ORAL_TABLET | ORAL | Status: DC

## 2013-11-08 MED ORDER — PROCHLORPERAZINE MALEATE 10 MG PO TABS: 10.0000 mg | ORAL_TABLET | Freq: Four times a day (QID) | ORAL | Status: DC | PRN

## 2013-11-08 MED ORDER — DEXAMETHASONE SODIUM PHOSPHATE 20 MG/5ML IJ SOLN
INTRAMUSCULAR | Status: AC
Start: 2013-11-08 — End: 2013-11-08
  Filled 2013-11-08: qty 5

## 2013-11-08 MED ORDER — PALONOSETRON HCL INJECTION 0.25 MG/5ML
INTRAVENOUS | Status: AC
  Filled 2013-11-08: qty 5

## 2013-11-08 MED ORDER — PALONOSETRON HCL INJECTION 0.25 MG/5ML
0.2500 mg | Freq: Once | INTRAVENOUS | Status: AC
  Administered 2013-11-08: 0.25 mg via INTRAVENOUS

## 2013-11-08 MED ORDER — DEXAMETHASONE SODIUM PHOSPHATE 20 MG/5ML IJ SOLN
12.0000 mg | Freq: Once | INTRAMUSCULAR | Status: AC
  Administered 2013-11-08: 12 mg via INTRAVENOUS

## 2013-11-08 NOTE — Patient Instructions (Signed)
You are doing well.  Your labs are stable.  You will proceed with treatment today.  We will see you back in one week for labs and IV hydration.  Please let me know if you would like to return on Friday for IV fluids and we can order them.  Please call us if you have any questions or concerns.

## 2013-11-08 NOTE — Progress Notes (Signed)
Patient is a 68 year old female diagnosed with breast cancer who is undergoing chemotherapy treatment.   Past medical history includes anesthesia complication, asthma, asthmatic bronchitis, pneumonia, breast cancer  Medications include magic mouthwash, b complex, Ca and Vit D, decadron, xylocaine, ativan, prilosec, zofran, compazine, and zinc gluconate  Labs include Total Protein 6.3, RBC 3.50, Hgb 11.0, Hct 33.4  Height: 5'6'' Weight: 160 lbs Usual Body Weight: 160 lbs BMI: 25.9  Pt reports that after each chemo treatment she is unable to eat anything (<200 calories/day) for 7-9 days. She attributes this to a change in taste where everything has a "sweet, medicinal taste." She has been able to maintain her weight because she becomes "extremely hungry" once she is able to eat again. She reports that last time she went through this, she lost 5 lbs and then gained it all back before her next treatment.  Nutritional Diagnosis: Inadequate oral intake related to breast cancer and associated treatments as evidenced by changes in taste and reported intake less than estimated needs.   Intervention: Pt was educated on ways to deal with taste and smell changes. She was advised to try rinsing her mouth with a mixture of warm water, salt, and baking soda several times before meals. Pt was given nutritional supplement samples to try including Glucerna Shakes and Unjury protein powder. Handouts were given. Teach-back method was used. Questions were answered.   Monitoring, evaluation, goals: Pt will tolerate oral intake to minimize nutrition side effects to promote weight stabilization.  Next Visit: Pt and daughter were supplied with contact information to RD office. They were advised to schedule a follow-up appointment if they had further nutritional concerns.   Terrace Arabia RD, LDN

## 2013-11-08 NOTE — Telephone Encounter (Signed)
Called and confirmed 12/09/13 genetic appt w/ pt.

## 2013-11-08 NOTE — Patient Instructions (Signed)
[employer] Discharge Instructions for Patients Receiving Chemotherapy  Today you received the following chemotherapy agents;  Epirubicin, 5-FU (fluorouracil), and Cytoxan.   To help prevent nausea and vomiting after your treatment, we encourage you to take your nausea medication as directed.    If you develop nausea and vomiting that is not controlled by your nausea medication, call the clinic.   BELOW ARE SYMPTOMS THAT SHOULD BE REPORTED IMMEDIATELY:  *FEVER GREATER THAN 100.5 F  *CHILLS WITH OR WITHOUT FEVER  NAUSEA AND VOMITING THAT IS NOT CONTROLLED WITH YOUR NAUSEA MEDICATION  *UNUSUAL SHORTNESS OF BREATH  *UNUSUAL BRUISING OR BLEEDING  TENDERNESS IN MOUTH AND THROAT WITH OR WITHOUT PRESENCE OF ULCERS  *URINARY PROBLEMS  *BOWEL PROBLEMS  UNUSUAL RASH Items with * indicate a potential emergency and should be followed up as soon as possible.  Feel free to call the clinic you have any questions or concerns. The clinic phone number is [phone].

## 2013-11-08 NOTE — Progress Notes (Signed)
ID: Wendy [name] OB: [birth year]  MR#: [id number]  CSN#:[id number]  PCP: PROVIDER NOT IN SYSTEM GYN:   SU: Dr. Dalbert Batman OTHER MD:  CHIEF COMPLAINT:[place] woman with T1 N0, stage IA, invasive ductal carcinoma, grade 2, ER negative, PR negative, Ki-67 of 13%, HER-2/neu negative.   BREAST CANCER HISTORY: Patient went for an annual mammogram and she was found to have an abnormality in the right breast at the 3:00 position. Subsequently had a right diagnostic mammogram performed. She also had a image guided biopsy of the right breast at the 3:00 position. 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 The tumor was ER negative PR negative HER-2/neu negative with a proliferation marker Ki-67 13%. All of her workup initially was performed in [place]. She subsequently was referred to Dr. Fanny Skates who saw her on 08/04/2003. He has recommended MRI of the breasts. This was performed on 08/09/2013. In the right breast there was an irregular enhancing mass in the 3:00 region measuring 1.5 x 1.0 x 1.4 cm. Also noted was nodular segmental enhancement extending 6.1 cm posteriorly from the mass towards the chest wall. The mass and nodular enhancement measured 7 cm. In the middle third of the lower outer quadrant of the right breast there was a 1.0 x 0.7 x 0.7 cm area of focal non-masslike enhancement. Left breast revealed no masses or abnormal enhancements. Lymph nodes appeared normal.  CURRENT THERAPY: Fluorouracil, Epirubicin, Cyclophosphamide cycle 3 day 1  INTERVAL HISTORY:  Wendy [name] is here today for evaluation prior to cycle 3 of FEC chemotherapy.  She is currently cycle 3 day 1 of therapy.  It is given on day 1 of a 21 day cycle with Neulasta given on day 2 for granulocyte support.    Wendy [name] is here today for evaluation prior to her third cycle of FEC chemotherapy.  She is feeling much better today.  She received IV hydration following her last appointment and  it has helped greatly.  She denies fevers, chills, nausea, vomiting, constipation, diarrhea, numbness, skin changes, or any further concerns.    REVIEW OF SYSTEMS: A 10 point review of systems was conducted and is otherwise negative except for what is noted above.    PAST MEDICAL HISTORY: Past Medical History  Diagnosis Date  . Family history of anesthesia complication     "daughter PONV; long time in recovery" (09/07/2013)  . Asthma     as a child  . Asthmatic bronchitis     "about q yr" (09/07/2013)  . Pneumonia     "several times; usually follows asthmatic bronchitis" (09/07/2013)  . Sinus headache     "maybe 2-3 times/month"  . Breast cancer     "right breast"     PAST SURGICAL HISTORY: Past Surgical History  Procedure Laterality Date  . Tubal ligation  1977  . Finger fracture surgery Left 2010    "middle finger spiral fx; little finger broken; tissue damage"  . Tonsillectomy    . Portacath placement Right 09/07/2013  . Mastectomy complete / simple w/ sentinel node biopsy Right 09/07/2013  . Mastectomy complete / simple  09/07/2013  . Breast biopsy Right 07/2013  . Dilation and curettage of uterus      S/P miscarriage  . Wrist fracture surgery Left 2010  . Mastectomy w/ sentinel node biopsy Bilateral 09/07/2013    Procedure: BILATERAL MASTECTOMY WITH RIGHT SENTINEL LYMPH NODE BIOPSY;  Surgeon: [name], MD;  Location: [place];  Service: General;  Laterality: Bilateral;  . Portacath placement Right 09/07/2013    Procedure: INSERTION PORT-A-CATH;  Surgeon: [name], MD;  Location: [place];  Service: General;  Laterality: Right;    FAMILY HISTORY Family History  Problem Relation Age of Onset  . Bladder Cancer Sister 64    worked in [employer], around 2nd hand smoke all her life  . Diabetes Sister   . Colon cancer Brother 43    non-smoker  . Diabetes Brother   . Colon cancer Maternal Uncle 41  . Osteoporosis Mother   . Parkinson's disease Mother   . Diabetes Mother   .  Rheum arthritis Father   . Diabetes Father   . Breast cancer Paternal Aunt     dx over 61  . Diabetes Maternal Grandmother   . Diabetes Maternal Grandfather   . Uterine cancer Other 50  . Colon cancer Maternal Aunt     dx in her 63s-70s  . Breast cancer Cousin     paternal cousin dx in her 11s-70s    GYNECOLOGIC HISTORY: menarche at age 23, G94, P2, patient took HRT for 4-5 years post menopause without problems, no h/o abnormal pap smears or sexually transmitted infections.    SOCIAL HISTORY:  Lives with husband of 46 years.  Patient is retired.  No tobacco, ETOH, illicit drug use.    ADVANCED DIRECTIVES:  In place, HCPOA husband [name] who can be reached at [phone].     HEALTH MAINTENANCE: History  Substance Use Topics  . Smoking status: Never Smoker   . Smokeless tobacco: Never Used  . Alcohol Use: No      Mammogram: 07/2013 Colonoscopy: 2013 Bone Density Scan: 08/2013 Pap Smear: 2013 Eye Exam: 2012 Vitamin D Level: unknown Lipid Panel: patient unsure likely 3-4 years ago   No Known Allergies  Current Outpatient Prescriptions  Medication Sig Dispense Refill  . b complex vitamins tablet Take 1 tablet by mouth daily.      . Calcium Carbonate-Vitamin D (CALCIUM PLUS VITAMIN D PO) Take 1 tablet by mouth 2 (two) times daily.      Marland Kitchen dexamethasone (DECADRON) 4 MG tablet Take 2 tablets by mouth once a day on the day after chemotherapy and then take 2 tablets two times a day for 2 days. Take with food.  30 tablet  1  . lidocaine-prilocaine (EMLA) cream Apply 1 application topically as needed.  30 g  1  . UNABLE TO FIND [id number] Post mastectomy camisole QTY: 2  Diagnosis Code 174.9  2 each  3  . zinc gluconate 50 MG tablet Take 50 mg by mouth daily.      . Alum & Mag Hydroxide-Simeth (MAGIC MOUTHWASH) SOLN Take 5 mLs by mouth 4 (four) times daily as needed for mouth pain.  240 mL  0  . lidocaine (XYLOCAINE) 2 % solution Use as directed 5 mLs in the mouth or throat 4 (four)  times daily as needed for mouth pain (use with current supply of MMW).  120 mL  1  . LORazepam (ATIVAN) 0.5 MG tablet TAKE ONE TABLET EVERY 6 HOURS AS NEEDED FOR NAUSEA AND VOMITING  30 tablet  0  . omeprazole (PRILOSEC) 40 MG capsule Take 1 capsule (40 mg total) by mouth daily.  30 capsule  5  . ondansetron (ZOFRAN) 8 MG tablet Take 1 tablet (8 mg total) by mouth 2 (two) times daily as needed. Start on the third day after chemotherapy.  30 tablet  1  . prochlorperazine (  COMPAZINE) 10 MG tablet Take 1 tablet (10 mg total) by mouth every 6 (six) hours as needed (Nausea or vomiting).  30 tablet  1   No current facility-administered medications for this visit.    OBJECTIVE: Filed Vitals:   11/08/13 0954  BP: 100/67  Pulse: 69  Temp: 98.2 F (36.8 C)  Resp: 18     Body mass index is 25.89 kg/(m^2).     GENERAL: Patient is a well appearing female in no acute distress HEENT:  Sclerae anicteric.  Oropharynx clear and moist. No ulcerations or evidence of oropharyngeal candidiasis. Neck is supple.  NODES:  No cervical, supraclavicular, or axillary lymphadenopathy palpated.  BREAST EXAM:  Deferred. LUNGS:  Clear to auscultation bilaterally.  No wheezes or rhonchi. HEART:  Regular rate and rhythm. No murmur appreciated. ABDOMEN:  Soft, nontender.  Positive, normoactive bowel sounds. No organomegaly palpated. MSK:  No focal spinal tenderness to palpation. Full range of motion bilaterally in the upper extremities. EXTREMITIES:  No peripheral edema.   SKIN:  Clear with no obvious rashes or skin changes. No nail dyscrasia. NEURO:  Nonfocal. Well oriented.  Appropriate affect. ECOG FS:0  LAB RESULTS:  CMP     Component Value Date/Time   NA 143 11/08/2013 0941   NA 141 08/31/2013 1038   K 4.5 11/08/2013 0941   K 4.7 08/31/2013 1038   CL 101 08/31/2013 1038   CO2 27 11/08/2013 0941   CO2 27 08/31/2013 1038   GLUCOSE 87 11/08/2013 0941   GLUCOSE 99 08/31/2013 1038   BUN 12.4 11/08/2013 0941   BUN 18  08/31/2013 1038   CREATININE 0.8 11/08/2013 0941   CREATININE 0.75 09/07/2013 1652   CALCIUM 9.1 11/08/2013 0941   CALCIUM 9.8 08/31/2013 1038   PROT 6.3* 11/08/2013 0941   PROT 7.4 08/31/2013 1038   ALBUMIN 3.6 11/08/2013 0941   ALBUMIN 4.0 08/31/2013 1038   AST 16 11/08/2013 0941   AST 22 08/31/2013 1038   ALT 15 11/08/2013 0941   ALT 17 08/31/2013 1038   ALKPHOS 55 11/08/2013 0941   ALKPHOS 70 08/31/2013 1038   BILITOT 0.22 11/08/2013 0941   BILITOT 0.3 08/31/2013 1038   GFRNONAA 84* 09/07/2013 1652   GFRAA >90 09/07/2013 1652    I No results found for this basename: SPEP,  UPEP,   kappa and lambda light chains    Lab Results  Component Value Date   WBC 4.0 11/08/2013   NEUTROABS 2.5 11/08/2013   HGB 11.0* 11/08/2013   HCT 33.4* 11/08/2013   MCV 95.4 11/08/2013   PLT 198 11/08/2013      Chemistry      Component Value Date/Time   NA 143 11/08/2013 0941   NA 141 08/31/2013 1038   K 4.5 11/08/2013 0941   K 4.7 08/31/2013 1038   CL 101 08/31/2013 1038   CO2 27 11/08/2013 0941   CO2 27 08/31/2013 1038   BUN 12.4 11/08/2013 0941   BUN 18 08/31/2013 1038   CREATININE 0.8 11/08/2013 0941   CREATININE 0.75 09/07/2013 1652      Component Value Date/Time   CALCIUM 9.1 11/08/2013 0941   CALCIUM 9.8 08/31/2013 1038   ALKPHOS 55 11/08/2013 0941   ALKPHOS 70 08/31/2013 1038   AST 16 11/08/2013 0941   AST 22 08/31/2013 1038   ALT 15 11/08/2013 0941   ALT 17 08/31/2013 1038   BILITOT 0.22 11/08/2013 0941   BILITOT 0.3 08/31/2013 1038       No results  found for this basename: LABCA2    No components found with this basename: BIPJR939    No results found for this basename: INR,  in the last 168 hours  Urinalysis    Component Value Date/Time   COLORURINE YELLOW 08/31/2013 [place] 08/31/2013 1018   LABSPEC 1.007 08/31/2013 1018   PHURINE 6.5 08/31/2013 1018   GLUCOSEU NEGATIVE 08/31/2013 1018   HGBUR NEGATIVE 08/31/2013 1018   BILIRUBINUR NEGATIVE 08/31/2013 1018   KETONESUR NEGATIVE 08/31/2013 1018   PROTEINUR  NEGATIVE 08/31/2013 1018   UROBILINOGEN 0.2 08/31/2013 1018   NITRITE NEGATIVE 08/31/2013 1018   LEUKOCYTESUR NEGATIVE 08/31/2013 1018    STUDIES: No results found.  ASSESSMENT: 68 y.o. [place] woman with T1 N0, stage IA, invasive ductal carcinoma, grade 2, ER negative, PR negative, Ki-67 of 13%, HER-2/neu negative.    1. The patient underwent bilateral mastectomy by Dr. Dalbert Batman on 09/07/2013, and a right port-a-cath was also placed at that time. Pathology revealed a grade 2, 1.7 cm invasive ductal carcinoma. Two lymph nodes were negative for malignancy. The tumor was ER negative, PR negative, HER-2/neu negative. Ki-67 was 13%.   2. Adjuvant Fluorouracil, Epirubicin, and Cyclophosphamide given on day 1 of a 21 day cycle with Neulasta given as granulocyte support was started on 09/27/13. A total of 6 cycles are planned.   PLAN:   Wendy [name] is doing well today.  Her labs are stable.  I reviewed them with her in detail.  She will proceed with cycle 3 of FEC chemotherapy today.  I refilled her Dexamethasone, Compazine, and Omeprazole today. She does get very nauseated and dehydrated following chemotherapy.  I offered for her to come in on Friday and receive IV Zofran 57m and NS 1L, she tells me that she will call me if she feels like she needs to come in prior to the weekend.    The patient will return in one week for labs and evaluation of chemotoxicities.  She knows to call [facility] the interim for any questions or concerns.  We can certainly see her sooner if needed.  I spent 25 minutes counseling the patient face to face.  The total time spent in the appointment was 30 minutes.  LMinette Headland [id number]/01/2014 10:55 AM

## 2013-11-09 ENCOUNTER — Telehealth: Payer: Self-pay

## 2013-11-09 ENCOUNTER — Ambulatory Visit (HOSPITAL_BASED_OUTPATIENT_CLINIC_OR_DEPARTMENT_OTHER): Payer: Commercial Managed Care - HMO

## 2013-11-09 VITALS — BP 99/42 | HR 70 | Temp 98.0°F

## 2013-11-09 DIAGNOSIS — C50211 Malignant neoplasm of upper-inner quadrant of right female breast: Secondary | ICD-10-CM

## 2013-11-09 DIAGNOSIS — C50919 Malignant neoplasm of unspecified site of unspecified female breast: Secondary | ICD-10-CM

## 2013-11-09 DIAGNOSIS — Z5189 Encounter for other specified aftercare: Secondary | ICD-10-CM

## 2013-11-09 MED ORDER — PEGFILGRASTIM INJECTION 6 MG/0.6ML
6.0000 mg | Freq: Once | SUBCUTANEOUS | Status: AC
  Administered 2013-11-09: 6 mg via SUBCUTANEOUS
  Filled 2013-11-09: qty 0.6

## 2013-11-09 NOTE — Telephone Encounter (Signed)
Returned call to pharmacy - provided [employer] ingredients per instructions provided by [place].  Pharmacy voiced udnerstanding.

## 2013-11-10 ENCOUNTER — Telehealth: Payer: Self-pay | Admitting: Adult Health

## 2013-11-10 ENCOUNTER — Telehealth: Payer: Self-pay | Admitting: *Deleted

## 2013-11-10 NOTE — Telephone Encounter (Signed)
Per staff message and POF I have scheduled appts.  JMW  

## 2013-11-10 NOTE — Telephone Encounter (Signed)
, °

## 2013-11-13 ENCOUNTER — Telehealth: Payer: Self-pay

## 2013-11-13 NOTE — Telephone Encounter (Signed)
Order sent on 11/13/13 to [facility] for L8000 and L8030.  Sent to scan.   

## 2013-11-16 ENCOUNTER — Ambulatory Visit (HOSPITAL_BASED_OUTPATIENT_CLINIC_OR_DEPARTMENT_OTHER): Payer: Commercial Managed Care - HMO

## 2013-11-16 ENCOUNTER — Ambulatory Visit: Payer: Commercial Managed Care - HMO

## 2013-11-16 ENCOUNTER — Encounter: Payer: Self-pay | Admitting: Oncology

## 2013-11-16 ENCOUNTER — Telehealth: Payer: Self-pay | Admitting: *Deleted

## 2013-11-16 ENCOUNTER — Telehealth: Payer: Self-pay | Admitting: Oncology

## 2013-11-16 ENCOUNTER — Ambulatory Visit (HOSPITAL_BASED_OUTPATIENT_CLINIC_OR_DEPARTMENT_OTHER): Payer: Commercial Managed Care - HMO | Admitting: Oncology

## 2013-11-16 ENCOUNTER — Other Ambulatory Visit (HOSPITAL_BASED_OUTPATIENT_CLINIC_OR_DEPARTMENT_OTHER): Payer: Commercial Managed Care - HMO

## 2013-11-16 ENCOUNTER — Ambulatory Visit: Payer: Commercial Managed Care - HMO | Admitting: Nutrition

## 2013-11-16 VITALS — BP 105/66 | HR 84 | Temp 98.6°F | Resp 18

## 2013-11-16 VITALS — BP 100/43 | HR 90 | Temp 100.0°F | Resp 18 | Ht 66.0 in | Wt 151.8 lb

## 2013-11-16 DIAGNOSIS — C50211 Malignant neoplasm of upper-inner quadrant of right female breast: Secondary | ICD-10-CM

## 2013-11-16 DIAGNOSIS — R630 Anorexia: Secondary | ICD-10-CM

## 2013-11-16 DIAGNOSIS — C50919 Malignant neoplasm of unspecified site of unspecified female breast: Secondary | ICD-10-CM

## 2013-11-16 DIAGNOSIS — Z171 Estrogen receptor negative status [ER-]: Secondary | ICD-10-CM

## 2013-11-16 DIAGNOSIS — D709 Neutropenia, unspecified: Secondary | ICD-10-CM

## 2013-11-16 LAB — TECHNOLOGIST REVIEW

## 2013-11-16 LAB — COMPREHENSIVE METABOLIC PANEL (CC13)
ALT: 30 U/L (ref 0–55)
AST: 21 U/L (ref 5–34)
Albumin: 3.3 g/dL — ABNORMAL LOW (ref 3.5–5.0)
Alkaline Phosphatase: 67 U/L (ref 40–150)
Anion Gap: 7 mEq/L (ref 3–11)
BILIRUBIN TOTAL: 0.81 mg/dL (ref 0.20–1.20)
BUN: 13.7 mg/dL (ref 7.0–26.0)
CO2: 26 mEq/L (ref 22–29)
CREATININE: 0.8 mg/dL (ref 0.6–1.1)
Calcium: 9.2 mg/dL (ref 8.4–10.4)
Chloride: 97 mEq/L — ABNORMAL LOW (ref 98–109)
GLUCOSE: 129 mg/dL (ref 70–140)
Potassium: 4.5 mEq/L (ref 3.5–5.1)
Sodium: 130 mEq/L — ABNORMAL LOW (ref 136–145)
Total Protein: 6.6 g/dL (ref 6.4–8.3)

## 2013-11-16 LAB — CBC WITH DIFFERENTIAL/PLATELET
HEMATOCRIT: 30.5 % — AB (ref 34.8–46.6)
HGB: 10.6 g/dL — ABNORMAL LOW (ref 11.6–15.9)
MCH: 31.8 pg (ref 25.1–34.0)
MCHC: 34.8 g/dL (ref 31.5–36.0)
MCV: 91.6 fL (ref 79.5–101.0)
PLATELETS: 59 10*3/uL — AB (ref 145–400)
RBC: 3.33 10*6/uL — ABNORMAL LOW (ref 3.70–5.45)
RDW: 14.9 % — ABNORMAL HIGH (ref 11.2–14.5)
WBC: <0.2 10*3/uL — CL (ref 3.9–10.3)

## 2013-11-16 LAB — URINALYSIS, MICROSCOPIC - CHCC
Bilirubin (Urine): NEGATIVE
GLUCOSE UR CHCC: 500 mg/dL
Ketones: 15 mg/dL
LEUKOCYTE ESTERASE: NEGATIVE
NITRITE: NEGATIVE
Protein: 30 mg/dL
RBC / HPF: NEGATIVE (ref 0–2)
Specific Gravity, Urine: 1.02 (ref 1.003–1.035)
Urobilinogen, UR: 0.2 mg/dL (ref 0.2–1)
pH: 6.5 (ref 4.6–8.0)

## 2013-11-16 MED ORDER — LORAZEPAM 2 MG/ML IJ SOLN
0.5000 mg | Freq: Once | INTRAMUSCULAR | Status: AC
  Administered 2013-11-16: 0.5 mg via INTRAVENOUS

## 2013-11-16 MED ORDER — LORAZEPAM 2 MG/ML IJ SOLN
INTRAMUSCULAR | Status: AC
  Filled 2013-11-16: qty 1

## 2013-11-16 MED ORDER — CIPROFLOXACIN HCL 500 MG PO TABS: 500.0000 mg | ORAL_TABLET | Freq: Two times a day (BID) | ORAL | Status: DC

## 2013-11-16 MED ORDER — SODIUM CHLORIDE 0.9 % IJ SOLN
10.0000 mL | INTRAMUSCULAR | Status: DC | PRN
  Administered 2013-11-16: 10 mL via INTRAVENOUS
  Filled 2013-11-16: qty 10

## 2013-11-16 MED ORDER — HEPARIN SOD (PORK) LOCK FLUSH 100 UNIT/ML IV SOLN
500.0000 [IU] | Freq: Once | INTRAVENOUS | Status: AC
  Administered 2013-11-16: 500 [IU] via INTRAVENOUS
  Filled 2013-11-16: qty 5

## 2013-11-16 MED ORDER — DEXTROSE-NACL 5-0.9 % IV SOLN
1000.0000 mL | Freq: Once | INTRAVENOUS | Status: AC
  Administered 2013-11-16: 1000 mL via INTRAVENOUS

## 2013-11-16 NOTE — Telephone Encounter (Signed)
, °

## 2013-11-16 NOTE — Addendum Note (Signed)
Addended by: Maryanna Shape on: 11/16/2013 12:51 PM   Modules accepted: Orders

## 2013-11-16 NOTE — Telephone Encounter (Signed)
Per staff message and POF I have scheduled appts. Advised scheduler of appts. JMW  

## 2013-11-16 NOTE — Progress Notes (Signed)
1350: Pt ambulatory to bathroom without difficulty, RN at side for assistance. Urine specimen collected.  Pt denies any complaints of dizziness or light-headedness.  Pt discharged with husband accompanying.

## 2013-11-16 NOTE — Progress Notes (Signed)
Nutrition followup completed with patient in the chemotherapy area while patient receiving IV fluids. Patient's weight reflects an 8 pound weight loss over one week.  Patient eating very little after chemotherapy.  Attempted to understand patient's reason for her inability to eat.  She denies nausea and vomiting.  She also denies constipation or diarrhea.  Her only complaint is taste alterations and foods tasting  "bad."  Patient complains of weakness and fatigue.  She states she cannot drink oral nutrition supplements because she does not like them.  Patient has not tried strategies patient was educated on previous nutrition visit.  Nutrition diagnosis: Inadequate oral intake continues.  Intervention: I reviewed multiple strategies for dealing with taste alterations.  Educated patient and husband on ways to change the taste of foods.  Strongly encouraged patient to rinse her mouth out prior to eating.  "Taste-tested" a sample of resource breeze mixed with ginger ale which patient was able to tolerate although she states she doesn't like it.  Nutrition fact sheets provided.  Patient educated to continue nausea medications as prescribed.  Questions were answered.  Teach back method used.  Monitoring, evaluation, goals: Patient will tolerate increased oral intake for weight stabilization.  Next visit: Monday, June 29 in the chemotherapy room.

## 2013-11-16 NOTE — Patient Instructions (Signed)
Labs from today:  Results for Wendy, [name] (MRN [id number]) as of 11/16/2013 09:45  Ref. Range 11/16/2013 09:05  WBC Latest Range: 4.0-10.5 K/uL < 0.2 (LL)  RBC Latest Range: 3.87-5.11 MIL/uL 3.33 (L)  Hemoglobin Latest Range: 12.0-15.0 g/dL 10.6 (L)  HCT Latest Range: 36.0-46.0 % 30.5 (L)  MCV Latest Range: 78.0-100.0 fL 91.6  MCH Latest Range: 26.0-34.0 pg 31.8  MCHC Latest Range: 30.0-36.0 g/dL 34.8  RDW Latest Range: 11.5-15.5 % 14.9 (H)  Platelets Latest Range: 150-400 K/uL 59 (L)   Call for temp 100.5 or greater. [phone]. Pick up Cipro from your pharmacy. Take 1 tablet twice a day for 7 days.

## 2013-11-16 NOTE — Progress Notes (Signed)
ID: Wendy [name] OB: 06/24/1945  MR#: [id number]  CSN#:[id number]  PCP: PROVIDER NOT IN SYSTEM GYN:   SU: Dr. Ingram OTHER MD:  CHIEF COMPLAINT:[place] woman with T1 N0, stage IA, invasive ductal carcinoma, grade 2, ER negative, PR negative, Ki-67 of 13%, HER-2/neu negative.   BREAST CANCER HISTORY: Patient went for an annual mammogram and she was found to have an abnormality in the right breast at the 3:00 position. Subsequently had a right diagnostic mammogram performed. She also had a image guided biopsy of the right breast at the 3:00 position. 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 The tumor was ER negative PR negative HER-2/neu negative with a proliferation marker Ki-67 13%. All of her workup initially was performed in [place]. She subsequently was referred to Dr. Haywood Ingram who saw her on 08/04/2003. He has recommended MRI of the breasts. This was performed on 08/09/2013. In the right breast there was an irregular enhancing mass in the 3:00 region measuring 1.5 x 1.0 x 1.4 cm. Also noted was nodular segmental enhancement extending 6.1 cm posteriorly from the mass towards the chest wall. The mass and nodular enhancement measured 7 cm. In the middle third of the lower outer quadrant of the right breast there was a 1.0 x 0.7 x 0.7 cm area of focal non-masslike enhancement. Left breast revealed no masses or abnormal enhancements. Lymph nodes appeared normal.  CURRENT THERAPY: Fluorouracil, Epirubicin, Cyclophosphamide cycle 3 day 8  INTERVAL HISTORY:  Wendy [name] is here today for evaluation for day 8 cycle 3 of FEC chemotherapy.  It is given on day 1 of a 21 day cycle with Neulasta given on day 2 for granulocyte support.    Wendy [name] is very tires and does not feel well.  She is not eating well and has lost weight. This has been typical of her past cycles of chemo where she does not eat for 7-10 days, and then eats very well and regains the lost  weight. She denies vomiting, but does have intermittent nausea. Nothing tastes good to her. She can take sips of fluid and that is really about it. She reported a low grade temp of 99.4 this am. She denies chills, vomiting, constipation, diarrhea, numbness, skin changes, or any further concerns.    REVIEW OF SYSTEMS: A 10 point review of systems was conducted and is otherwise negative except for what is noted above.    PAST MEDICAL HISTORY: Past Medical History  Diagnosis Date  . Family history of anesthesia complication     "daughter PONV; long time in recovery" (09/07/2013)  . Asthma     as a child  . Asthmatic bronchitis     "about q yr" (09/07/2013)  . Pneumonia     "several times; usually follows asthmatic bronchitis" (09/07/2013)  . Sinus headache     "maybe 2-3 times/month"  . Breast cancer     "right breast"     PAST SURGICAL HISTORY: Past Surgical History  Procedure Laterality Date  . Tubal ligation  1977  . Finger fracture surgery Left 2010    "middle finger spiral fx; little finger broken; tissue damage"  . Tonsillectomy    . Portacath placement Right 09/07/2013  . Mastectomy complete / simple w/ sentinel node biopsy Right 09/07/2013  . Mastectomy complete / simple  09/07/2013  . Breast biopsy Right 07/2013  . Dilation and curettage of uterus      S/P miscarriage  . Wrist fracture surgery Left 2010  .   Mastectomy w/ sentinel node biopsy Bilateral 09/07/2013    Procedure: BILATERAL MASTECTOMY WITH RIGHT SENTINEL LYMPH NODE BIOPSY;  Surgeon: Haywood M Ingram, MD;  Location: [facility] OR;  Service: General;  Laterality: Bilateral;  . Portacath placement Right 09/07/2013    Procedure: INSERTION PORT-A-CATH;  Surgeon: Haywood M Ingram, MD;  Location: [facility] OR;  Service: General;  Laterality: Right;    FAMILY HISTORY Family History  Problem Relation Age of Onset  . 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 Diabetes Sister   . Colon cancer Brother 65     non-smoker  . Diabetes Brother   . Colon cancer Maternal Uncle 60  . Osteoporosis Mother   . Parkinson's disease Mother   . Diabetes Mother   . Rheum arthritis Father   . Diabetes Father   . Breast cancer Paternal Aunt     dx over 50  . Diabetes Maternal Grandmother   . Diabetes Maternal Grandfather   . Uterine cancer Other 50  . Colon cancer Maternal Aunt     dx in her 60s-70s  . Breast cancer Cousin     paternal cousin dx in her 60s-70s    GYNECOLOGIC HISTORY: menarche at age 9, G3, P2, patient took HRT for 4-5 years post menopause without problems, no h/o abnormal pap smears or sexually transmitted infections.    SOCIAL HISTORY:  Lives with husband of 44 years.  Patient is retired.  No tobacco, ETOH, illicit drug use.    ADVANCED DIRECTIVES:  In place, HCPOA husband Glenn who can be reached at [phone].     HEALTH MAINTENANCE: History  Substance Use Topics  . Smoking status: Never Smoker   . Smokeless tobacco: Never Used  . Alcohol Use: No      Mammogram: 07/2013 Colonoscopy: 2013 Bone Density Scan: 08/2013 Pap Smear: 2013 Eye Exam: 2012 Vitamin D Level: unknown Lipid Panel: patient unsure likely 3-4 years ago   No Known Allergies  Current Outpatient Prescriptions  Medication Sig Dispense Refill  . Alum & Mag Hydroxide-Simeth (MAGIC MOUTHWASH) SOLN SWISH AND spit ONE TEASPOONFUL BY MOUTH FOUR TIMES DAILY AS NEEDED.  240 mL  1  . b complex vitamins tablet Take 1 tablet by mouth daily.      . lidocaine-prilocaine (EMLA) cream Apply 1 application topically as needed.  30 g  1  . LORazepam (ATIVAN) 0.5 MG tablet TAKE ONE TABLET EVERY 6 HOURS AS NEEDED FOR NAUSEA AND VOMITING  30 tablet  0  . omeprazole (PRILOSEC) 40 MG capsule Take 1 capsule (40 mg total) by mouth daily.  30 capsule  5  . ondansetron (ZOFRAN) 8 MG tablet Take 1 tablet (8 mg total) by mouth 2 (two) times daily as needed. Start on the third day after chemotherapy.  30 tablet  1  .  prochlorperazine (COMPAZINE) 10 MG tablet TAKE ONE TABLET EVERY 6 HOURS AS NEEDED FOR NAUSEA AND VOMITING  30 tablet  1  . UNABLE TO FIND L8015 Post mastectomy camisole QTY: 2  Diagnosis Code 174.9  2 each  3  . zinc gluconate 50 MG tablet Take 50 mg by mouth daily.      . Calcium Carbonate-Vitamin D (CALCIUM PLUS VITAMIN D PO) Take 1 tablet by mouth 2 (two) times daily.      . ciprofloxacin (CIPRO) 500 MG tablet Take 1 tablet (500 mg total) by mouth 2 (two) times daily.  14 tablet  0  .   dexamethasone (DECADRON) 4 MG tablet Take 2 tablets by mouth once a day on the day after chemotherapy and then take 2 tablets two times a day for 2 days. Take with food.  30 tablet  1  . lidocaine (XYLOCAINE) 2 % solution Use as directed 5 mLs in the mouth or throat 4 (four) times daily as needed for mouth pain (use with current supply of MMW).  120 mL  1   No current facility-administered medications for this visit.   Facility-Administered Medications Ordered in Other Visits  Medication Dose Route Frequency Provider Last Rate Last Dose  . dextrose 5 %-0.9 % sodium chloride infusion  1,000 mL Intravenous Once Kristin R Curcio, NP 500 mL/hr at 11/16/13 1038 1,000 mL at 11/16/13 1038    OBJECTIVE: Filed Vitals:   11/16/13 0928  BP: 100/43  Pulse: 90  Temp: 100 F (37.8 C)  Resp: 18     Body mass index is 24.51 kg/(m^2).     GENERAL: Patient is tired appearing and in no acute distress. She does not appear toxic. HEENT:  Sclerae anicteric.  Oropharynx clear and moist. No ulcerations or evidence of oropharyngeal candidiasis. Neck is supple.  NODES:  No cervical, supraclavicular, or axillary lymphadenopathy palpated.  BREAST EXAM:  Deferred. LUNGS:  Clear to auscultation bilaterally.  No wheezes or rhonchi. HEART:  Regular rate and rhythm. No murmur appreciated. ABDOMEN:  Soft, nontender.  Positive, normoactive bowel sounds. No organomegaly palpated. MSK:  No focal spinal tenderness to palpation. Full  range of motion bilaterally in the upper extremities. EXTREMITIES:  No peripheral edema.   SKIN:  Clear with no obvious rashes or skin changes. No nail dyscrasia. NEURO:  Nonfocal. Well oriented.  Appropriate affect. ECOG FS:0  LAB RESULTS:  CMP     Component Value Date/Time   NA 130* 11/16/2013 0905   NA 141 08/31/2013 1038   K 4.5 11/16/2013 0905   K 4.7 08/31/2013 1038   CL 101 08/31/2013 1038   CO2 26 11/16/2013 0905   CO2 27 08/31/2013 1038   GLUCOSE 129 11/16/2013 0905   GLUCOSE 99 08/31/2013 1038   BUN 13.7 11/16/2013 0905   BUN 18 08/31/2013 1038   CREATININE 0.8 11/16/2013 0905   CREATININE 0.75 09/07/2013 1652   CALCIUM 9.2 11/16/2013 0905   CALCIUM 9.8 08/31/2013 1038   PROT 6.6 11/16/2013 0905   PROT 7.4 08/31/2013 1038   ALBUMIN 3.3* 11/16/2013 0905   ALBUMIN 4.0 08/31/2013 1038   AST 21 11/16/2013 0905   AST 22 08/31/2013 1038   ALT 30 11/16/2013 0905   ALT 17 08/31/2013 1038   ALKPHOS 67 11/16/2013 0905   ALKPHOS 70 08/31/2013 1038   BILITOT 0.81 11/16/2013 0905   BILITOT 0.3 08/31/2013 1038   GFRNONAA 84* 09/07/2013 1652   GFRAA >90 09/07/2013 1652    I No results found for this basename: SPEP,  UPEP,   kappa and lambda light chains    Lab Results  Component Value Date   WBC < 0.2* 11/16/2013   NEUTROABS 2.5 11/08/2013   HGB 10.6* 11/16/2013   HCT 30.5* 11/16/2013   MCV 91.6 11/16/2013   PLT 59* 11/16/2013      Chemistry      Component Value Date/Time   NA 130* 11/16/2013 0905   NA 141 08/31/2013 1038   K 4.5 11/16/2013 0905   K 4.7 08/31/2013 1038   CL 101 08/31/2013 1038   CO2 26 11/16/2013 0905   CO2 27 08/31/2013 1038     BUN 13.7 11/16/2013 0905   BUN 18 08/31/2013 1038   CREATININE 0.8 11/16/2013 0905   CREATININE 0.75 09/07/2013 1652      Component Value Date/Time   CALCIUM 9.2 11/16/2013 0905   CALCIUM 9.8 08/31/2013 1038   ALKPHOS 67 11/16/2013 0905   ALKPHOS 70 08/31/2013 1038   AST 21 11/16/2013 0905   AST 22 08/31/2013 1038   ALT 30 11/16/2013 0905   ALT 17 08/31/2013  1038   BILITOT 0.81 11/16/2013 0905   BILITOT 0.3 08/31/2013 1038       No results found for this basename: LABCA2    No components found with this basename: LABCA125    No results found for this basename: INR,  in the last 168 hours  Urinalysis    Component Value Date/Time   COLORURINE YELLOW 08/31/2013 1018   APPEARANCEUR CLEAR 08/31/2013 1018   LABSPEC 1.007 08/31/2013 1018   PHURINE 6.5 08/31/2013 1018   GLUCOSEU NEGATIVE 08/31/2013 1018   HGBUR NEGATIVE 08/31/2013 1018   BILIRUBINUR NEGATIVE 08/31/2013 1018   KETONESUR NEGATIVE 08/31/2013 1018   PROTEINUR NEGATIVE 08/31/2013 1018   UROBILINOGEN 0.2 08/31/2013 1018   NITRITE NEGATIVE 08/31/2013 1018   LEUKOCYTESUR NEGATIVE 08/31/2013 1018    STUDIES: No results found.  ASSESSMENT: 67 y.o. [place] woman with T1 N0, stage IA, invasive ductal carcinoma, grade 2, ER negative, PR negative, Ki-67 of 13%, HER-2/neu negative.    1. The patient underwent bilateral mastectomy by Dr. Ingram on 09/07/2013, and a right port-a-cath was also placed at that time. Pathology revealed a grade 2, 1.7 cm invasive ductal carcinoma. Two lymph nodes were negative for malignancy. The tumor was ER negative, PR negative, HER-2/neu negative. Ki-67 was 13%.   2. Adjuvant Fluorouracil, Epirubicin, and Cyclophosphamide given on day 1 of a 21 day cycle with Neulasta given as granulocyte support was started on 09/27/13. A total of 6 cycles are planned.   PLAN:   Wendy [name] is neutropenic today. I have reviewed her counts with her and her husband. We have discussed neutropenic precautions. Will begin Cipro 500 mg BID for 1 week. Will also obtain a U/A, C&s and blood cultures given her neutropenia. She was instructed to call our office for a temp of 100.5 or higher.   She will proceed with IVF as scheduled today. I will also schedule her for IVF on 6/17 & 6/18 given that she is not eating or drinking well. If she feels better, she is aware that she can cancel these  appointments. We have discussed PO intake and Barb Neff will see her today in infusion for further recommendations.   We will plan to bring her back prior to cycle 4 of FEC on 11/29/13. If she continues to tolerate her chemotherapy poorly, then we may need to consider dose modification in the future. I have also discussed scheduling her for IVF for several days after chemo since she is not eating or drinking well. This will be determined at her next visit.   She knows to call us in the interim for any questions or concerns.  We can certainly see her sooner if needed.  I spent 25 minutes counseling the patient face to face.  The total time spent in the appointment was 30 minutes.  Kristin Curcio, DNP, AGPCNP-BC  11/16/2013 12:19 PM    

## 2013-11-16 NOTE — Patient Instructions (Signed)
Neutropenia Neutropenia is a condition that occurs when the level of a certain type of white blood cell (neutrophil) in your body becomes lower than normal. Neutrophils are made in the bone marrow and fight infections. These cells protect against bacteria and viruses. The fewer neutrophils you have, and the longer your body remains without them, the greater your risk of getting a severe infection becomes. CAUSES  The cause of neutropenia may be hard to determine. However, it is usually due to 3 main problems:   Decreased production of neutrophils. This may be due to:  Certain medicines such as chemotherapy.  Genetic problems.  Cancer.  Radiation treatments.  Vitamin deficiency.  Some pesticides.  Increased destruction of neutrophils. This may be due to:  Overwhelming infections.  Hemolytic anemia. This is when the body destroys its own blood cells.  Chemotherapy.  Neutrophils moving to areas of the body where they cannot fight infections. This may be due to:  Dialysis procedures.  Conditions where the spleen becomes enlarged. Neutrophils are held in the spleen and are not available to the rest of the body.  Overwhelming infections. The neutrophils are held in the area of the infection and are not available to the rest of the body. SYMPTOMS  There are no specific symptoms of neutropenia. The lack of neutrophils can result in an infection, and an infection can cause various problems. DIAGNOSIS  Diagnosis is made by a blood test. A complete blood count is performed. The normal level of neutrophils in human blood differs with age and race. Infants have lower counts than older children and adults. African Americans have lower counts than Caucasians or Asians. The average adult level is 1500 cells/mm3 of blood. Neutrophil counts are interpreted as follows:  Greater than 1000 cells/mm3 gives normal protection against infection.  500 to 1000 cells/mm3 gives an increased risk for  infection.  200 to 500 cells/mm3 is a greater risk for severe infection.  Lower than 200 cells/mm3 is a marked risk of infection. This may require hospitalization and treatment with antibiotic medicines. TREATMENT  Treatment depends on the underlying cause, severity, and presence of infections or symptoms. It also depends on your health. Your caregiver will discuss the treatment plan with you. Mild cases are often easily treated and have a good outcome. Preventative measures may also be started to limit your risk of infections. Treatment can include:  Taking antibiotics.  Stopping medicines that are known to cause neutropenia.  Correcting nutritional deficiencies by eating green vegetables to supply folic acid and taking vitamin B supplements.  Stopping exposure to pesticides if your neutropenia is related to pesticide exposure.  Taking a blood growth factor called sargramostim, pegfilgrastim, or filgrastim if you are undergoing chemotherapy for cancer. This stimulates white blood cell production.  Removal of the spleen if you have Felty's syndrome and have repeated infections. HOME CARE INSTRUCTIONS   Follow your caregiver's instructions about when you need to have blood work done.  Wash your hands often. Make sure others who come in contact with you also wash their hands.  Wash raw fruits and vegetables before eating them. They can carry bacteria and fungi.  Avoid people with colds or spreadable (contagious) diseases (chickenpox, herpes zoster, influenza).  Avoid large crowds.  Avoid construction areas. The dust can release fungus into the air.  Be cautious around children in daycare or school environments.  Take care of your respiratory system by coughing and deep breathing.  Bathe daily.  Protect your skin from cuts and   burns.  Do not work in the garden or with flowers and plants.  Care for the mouth before and after meals by brushing with a soft toothbrush. If you have  mucositis, do not use mouthwash. Mouthwash contains alcohol and can dry out the mouth even more.  Clean the area between the genitals and the anus (perineal area) after urination and bowel movements. Women need to wipe from front to back.  Use a water soluble lubricant during sexual intercourse and practice good hygiene after. Do not have intercourse if you are severely neutropenic. Check with your caregiver for guidelines.  Exercise daily as tolerated.  Avoid people who were vaccinated with a live vaccine in the past 30 days. You should not receive live vaccines (polio, typhoid).  Do not provide direct care for pets. Avoid animal droppings. Do not clean litter boxes and bird cages.  Do not share food utensils.  Do not use tampons, enemas, or rectal suppositories unless directed by your caregiver.  Use an electric razor to remove hair.  Wash your hands after handling magazines, letters, and newspapers. SEEK IMMEDIATE MEDICAL CARE IF:   You have a fever.  You have chills or start to shake.  You feel nauseous or vomit.  You develop mouth sores.  You develop aches and pains.  You have redness and swelling around open wounds.  Your skin is warm to the touch.  You have pus coming from your wounds.  You develop swollen lymph nodes.  You feel weak or fatigued.  You develop red streaks on the skin. MAKE SURE YOU:  Understand these instructions.  Will watch your condition.  Will get help right away if you are not doing well or get worse. Document Released: 11/09/2001 Document Revised: 08/12/2011 Document Reviewed: 12/07/2010 [facility] Patient Information 2014 [place], [place].   Dehydration, Adult Dehydration is when you lose more fluids from the body than you take in. Vital organs like the kidneys, brain, and heart cannot function without a proper amount of fluids and salt. Any loss of fluids from the body can cause dehydration.  CAUSES    Vomiting.  Diarrhea.  Excessive sweating.  Excessive urine output.  Fever. SYMPTOMS  Mild dehydration  Thirst.  Dry lips.  Slightly dry mouth. Moderate dehydration  Very dry mouth.  Sunken eyes.  Skin does not bounce back quickly when lightly pinched and released.  Dark urine and decreased urine production.  Decreased tear production.  Headache. Severe dehydration  Very dry mouth.  Extreme thirst.  Rapid, weak pulse (more than 100 beats per minute at rest).  Cold hands and feet.  Not able to sweat in spite of heat and temperature.  Rapid breathing.  Blue lips.  Confusion and lethargy.  Difficulty being awakened.  Minimal urine production.  No tears. DIAGNOSIS  Your caregiver will diagnose dehydration based on your symptoms and your exam. Blood and urine tests will help confirm the diagnosis. The diagnostic evaluation should also identify the cause of dehydration. TREATMENT  Treatment of mild or moderate dehydration can often be done at home by increasing the amount of fluids that you drink. It is best to drink small amounts of fluid more often. Drinking too much at one time can make vomiting worse. Refer to the home care instructions below. Severe dehydration needs to be treated at the hospital where you will probably be given intravenous (IV) fluids that contain water and electrolytes. HOME CARE INSTRUCTIONS   Ask your caregiver about specific rehydration instructions.  Drink enough  fluids to keep your urine clear or pale yellow.  Drink small amounts frequently if you have nausea and vomiting.  Eat as you normally do.  Avoid:  Foods or drinks high in sugar.  Carbonated drinks.  Juice.  Extremely hot or cold fluids.  Drinks with caffeine.  Fatty, greasy foods.  Alcohol.  Tobacco.  Overeating.  Gelatin desserts.  Wash your hands well to avoid spreading bacteria and viruses.  Only take over-the-counter or prescription  medicines for pain, discomfort, or fever as directed by your caregiver.  Ask your caregiver if you should continue all prescribed and over-the-counter medicines.  Keep all follow-up appointments with your caregiver. SEEK MEDICAL CARE IF:  You have abdominal pain and it increases or stays in one area (localizes).  You have a rash, stiff neck, or severe headache.  You are irritable, sleepy, or difficult to awaken.  You are weak, dizzy, or extremely thirsty. SEEK IMMEDIATE MEDICAL CARE IF:   You are unable to keep fluids down or you get worse despite treatment.  You have frequent episodes of vomiting or diarrhea.  You have blood or green matter (bile) in your vomit.  You have blood in your stool or your stool looks black and tarry.  You have not urinated in 6 to 8 hours, or you have only urinated a small amount of very dark urine.  You have a fever.  You faint. MAKE SURE YOU:   Understand these instructions.  Will watch your condition.  Will get help right away if you are not doing well or get worse. Document Released: 05/20/2005 Document Revised: 08/12/2011 Document Reviewed: 01/07/2011 [facility] Patient Information 2014 [place], [place].

## 2013-11-17 ENCOUNTER — Other Ambulatory Visit: Payer: Self-pay | Admitting: *Deleted

## 2013-11-17 ENCOUNTER — Ambulatory Visit (HOSPITAL_BASED_OUTPATIENT_CLINIC_OR_DEPARTMENT_OTHER): Payer: Commercial Managed Care - HMO

## 2013-11-17 ENCOUNTER — Telehealth: Payer: Self-pay | Admitting: Oncology

## 2013-11-17 VITALS — BP 95/43 | HR 87 | Temp 98.6°F

## 2013-11-17 DIAGNOSIS — C50211 Malignant neoplasm of upper-inner quadrant of right female breast: Secondary | ICD-10-CM

## 2013-11-17 DIAGNOSIS — C50919 Malignant neoplasm of unspecified site of unspecified female breast: Secondary | ICD-10-CM

## 2013-11-17 LAB — URINE CULTURE

## 2013-11-17 MED ORDER — SODIUM CHLORIDE 0.9 % IJ SOLN
10.0000 mL | Freq: Once | INTRAMUSCULAR | Status: AC
  Administered 2013-11-17: 10 mL via INTRAVENOUS
  Filled 2013-11-17: qty 10

## 2013-11-17 MED ORDER — HEPARIN SOD (PORK) LOCK FLUSH 100 UNIT/ML IV SOLN
500.0000 [IU] | Freq: Once | INTRAVENOUS | Status: AC
  Administered 2013-11-17: 500 [IU] via INTRAVENOUS
  Filled 2013-11-17: qty 5

## 2013-11-17 MED ORDER — DEXTROSE-NACL 5-0.9 % IV SOLN
1000.0000 mL | Freq: Once | INTRAVENOUS | Status: AC
Start: 2013-11-17 — End: 2013-11-17
  Administered 2013-11-17: 1000 mL via INTRAVENOUS

## 2013-11-17 NOTE — Patient Instructions (Addendum)

## 2013-11-17 NOTE — Telephone Encounter (Signed)
per pof to sch labs-per pof may need to be drawn by nurse in trmt room-no need to call pt/pt will be here @ 8

## 2013-11-18 ENCOUNTER — Ambulatory Visit (HOSPITAL_BASED_OUTPATIENT_CLINIC_OR_DEPARTMENT_OTHER): Payer: Commercial Managed Care - HMO

## 2013-11-18 ENCOUNTER — Other Ambulatory Visit (HOSPITAL_BASED_OUTPATIENT_CLINIC_OR_DEPARTMENT_OTHER): Payer: Commercial Managed Care - HMO

## 2013-11-18 VITALS — BP 102/45 | HR 79 | Temp 97.7°F | Resp 18

## 2013-11-18 DIAGNOSIS — C50919 Malignant neoplasm of unspecified site of unspecified female breast: Secondary | ICD-10-CM

## 2013-11-18 DIAGNOSIS — E860 Dehydration: Secondary | ICD-10-CM

## 2013-11-18 DIAGNOSIS — C50211 Malignant neoplasm of upper-inner quadrant of right female breast: Secondary | ICD-10-CM

## 2013-11-18 LAB — CBC WITH DIFFERENTIAL/PLATELET
BASO%: 0.6 % (ref 0.0–2.0)
BASOS ABS: 0 10*3/uL (ref 0.0–0.1)
EOS%: 0 % (ref 0.0–7.0)
Eosinophils Absolute: 0 10*3/uL (ref 0.0–0.5)
HEMATOCRIT: 24.4 % — AB (ref 34.8–46.6)
LYMPH%: 8.8 % — ABNORMAL LOW (ref 14.0–49.7)
MCH: 31.5 pg (ref 25.1–34.0)
MCHC: 34.8 g/dL (ref 31.5–36.0)
MCV: 90.4 fL (ref 79.5–101.0)
MONO#: 0.2 10*3/uL (ref 0.1–0.9)
MONO%: 11.3 % (ref 0.0–14.0)
NEUT#: 1.3 10*3/uL — ABNORMAL LOW (ref 1.5–6.5)
NEUT%: 79.3 % — AB (ref 38.4–76.8)
Platelets: 63 10*3/uL — ABNORMAL LOW (ref 145–400)
RBC: 2.7 10*6/uL — ABNORMAL LOW (ref 3.70–5.45)
RDW: 15.1 % — ABNORMAL HIGH (ref 11.2–14.5)
WBC: 1.6 10*3/uL — ABNORMAL LOW (ref 3.9–10.3)
lymph#: 0.1 10*3/uL — ABNORMAL LOW (ref 0.9–3.3)
nRBC: 0 % (ref 0–0)

## 2013-11-18 MED ORDER — SODIUM CHLORIDE 0.9 % IJ SOLN
10.0000 mL | INTRAMUSCULAR | Status: DC | PRN
  Administered 2013-11-18: 10 mL via INTRAVENOUS
  Filled 2013-11-18: qty 10

## 2013-11-18 MED ORDER — HEPARIN SOD (PORK) LOCK FLUSH 100 UNIT/ML IV SOLN
500.0000 [IU] | Freq: Once | INTRAVENOUS | Status: AC
  Administered 2013-11-18: 500 [IU] via INTRAVENOUS
  Filled 2013-11-18: qty 5

## 2013-11-18 MED ORDER — DEXTROSE-NACL 5-0.9 % IV SOLN
1000.0000 mL | Freq: Once | INTRAVENOUS | Status: AC
  Administered 2013-11-18: 1000 mL via INTRAVENOUS

## 2013-11-18 NOTE — Progress Notes (Signed)
Pt ambulatory upon discharge. Husband accompanying. Denies any dizziness or light-headedness.

## 2013-11-18 NOTE — Patient Instructions (Signed)
Dehydration, Adult Dehydration is when you lose more fluids from the body than you take in. Vital organs like the kidneys, brain, and heart cannot function without a proper amount of fluids and salt. Any loss of fluids from the body can cause dehydration.  CAUSES   Vomiting.  Diarrhea.  Excessive sweating.  Excessive urine output.  Fever. SYMPTOMS  Mild dehydration  Thirst.  Dry lips.  Slightly dry mouth. Moderate dehydration  Very dry mouth.  Sunken eyes.  Skin does not bounce back quickly when lightly pinched and released.  Dark urine and decreased urine production.  Decreased tear production.  Headache. Severe dehydration  Very dry mouth.  Extreme thirst.  Rapid, weak pulse (more than 100 beats per minute at rest).  Cold hands and feet.  Not able to sweat in spite of heat and temperature.  Rapid breathing.  Blue lips.  Confusion and lethargy.  Difficulty being awakened.  Minimal urine production.  No tears. DIAGNOSIS  Your caregiver will diagnose dehydration based on your symptoms and your exam. Blood and urine tests will help confirm the diagnosis. The diagnostic evaluation should also identify the cause of dehydration. TREATMENT  Treatment of mild or moderate dehydration can often be done at home by increasing the amount of fluids that you drink. It is best to drink small amounts of fluid more often. Drinking too much at one time can make vomiting worse. Refer to the home care instructions below. Severe dehydration needs to be treated at the hospital where you will probably be given intravenous (IV) fluids that contain water and electrolytes. HOME CARE INSTRUCTIONS   Ask your caregiver about specific rehydration instructions.  Drink enough fluids to keep your urine clear or pale yellow.  Drink small amounts frequently if you have nausea and vomiting.  Eat as you normally do.  Avoid:  Foods or drinks high in sugar.  Carbonated  drinks.  Juice.  Extremely hot or cold fluids.  Drinks with caffeine.  Fatty, greasy foods.  Alcohol.  Tobacco.  Overeating.  Gelatin desserts.  Wash your hands well to avoid spreading bacteria and viruses.  Only take over-the-counter or prescription medicines for pain, discomfort, or fever as directed by your caregiver.  Ask your caregiver if you should continue all prescribed and over-the-counter medicines.  Keep all follow-up appointments with your caregiver. SEEK MEDICAL CARE IF:  You have abdominal pain and it increases or stays in one area (localizes).  You have a rash, stiff neck, or severe headache.  You are irritable, sleepy, or difficult to awaken.  You are weak, dizzy, or extremely thirsty. SEEK IMMEDIATE MEDICAL CARE IF:   You are unable to keep fluids down or you get worse despite treatment.  You have frequent episodes of vomiting or diarrhea.  You have blood or green matter (bile) in your vomit.  You have blood in your stool or your stool looks black and tarry.  You have not urinated in 6 to 8 hours, or you have only urinated a small amount of very dark urine.  You have a fever.  You faint. MAKE SURE YOU:   Understand these instructions.  Will watch your condition.  Will get help right away if you are not doing well or get worse. Document Released: 05/20/2005 Document Revised: 08/12/2011 Document Reviewed: 01/07/2011 ExitCare Patient Information 2015 [employer]. This information is not intended to replace advice given to you by your health care provider. Make sure you discuss any questions you have with your health care   provider.  

## 2013-11-19 ENCOUNTER — Ambulatory Visit: Payer: Commercial Managed Care - HMO

## 2013-11-22 ENCOUNTER — Telehealth: Payer: Self-pay | Admitting: *Deleted

## 2013-11-22 LAB — CULTURE, BLOOD (SINGLE)

## 2013-11-22 NOTE — Telephone Encounter (Signed)
Called to check on and follow up on [facility]-a-Nurse call made on Saturday 11/20/13. Patient did not answer, left detailed voicemail that if she was not feeling better or running any fever, she should call our office back.

## 2013-11-29 ENCOUNTER — Ambulatory Visit (HOSPITAL_BASED_OUTPATIENT_CLINIC_OR_DEPARTMENT_OTHER): Payer: Commercial Managed Care - HMO | Admitting: Adult Health

## 2013-11-29 ENCOUNTER — Encounter: Payer: Self-pay | Admitting: Adult Health

## 2013-11-29 ENCOUNTER — Ambulatory Visit (HOSPITAL_BASED_OUTPATIENT_CLINIC_OR_DEPARTMENT_OTHER): Payer: Commercial Managed Care - HMO

## 2013-11-29 ENCOUNTER — Other Ambulatory Visit: Payer: Self-pay | Admitting: Oncology

## 2013-11-29 ENCOUNTER — Encounter: Payer: Commercial Managed Care - HMO | Admitting: Nutrition

## 2013-11-29 ENCOUNTER — Ambulatory Visit: Payer: Commercial Managed Care - HMO | Admitting: Nutrition

## 2013-11-29 ENCOUNTER — Other Ambulatory Visit (HOSPITAL_BASED_OUTPATIENT_CLINIC_OR_DEPARTMENT_OTHER): Payer: Commercial Managed Care - HMO

## 2013-11-29 ENCOUNTER — Telehealth: Payer: Self-pay | Admitting: Adult Health

## 2013-11-29 ENCOUNTER — Telehealth: Payer: Self-pay | Admitting: *Deleted

## 2013-11-29 VITALS — BP 100/66 | HR 80 | Temp 97.0°F | Resp 18 | Ht 66.0 in | Wt 153.9 lb

## 2013-11-29 DIAGNOSIS — C50919 Malignant neoplasm of unspecified site of unspecified female breast: Secondary | ICD-10-CM

## 2013-11-29 DIAGNOSIS — Z5111 Encounter for antineoplastic chemotherapy: Secondary | ICD-10-CM

## 2013-11-29 DIAGNOSIS — C50211 Malignant neoplasm of upper-inner quadrant of right female breast: Secondary | ICD-10-CM

## 2013-11-29 DIAGNOSIS — Z171 Estrogen receptor negative status [ER-]: Secondary | ICD-10-CM

## 2013-11-29 DIAGNOSIS — C50219 Malignant neoplasm of upper-inner quadrant of unspecified female breast: Secondary | ICD-10-CM

## 2013-11-29 DIAGNOSIS — E860 Dehydration: Secondary | ICD-10-CM

## 2013-11-29 DIAGNOSIS — Z9010 Acquired absence of unspecified breast and nipple: Secondary | ICD-10-CM

## 2013-11-29 LAB — CBC WITH DIFFERENTIAL/PLATELET
BASO%: 1.1 % (ref 0.0–2.0)
Basophils Absolute: 0.1 10*3/uL (ref 0.0–0.1)
EOS%: 0.4 % (ref 0.0–7.0)
Eosinophils Absolute: 0 10*3/uL (ref 0.0–0.5)
HCT: 32.6 % — ABNORMAL LOW (ref 34.8–46.6)
HGB: 10.9 g/dL — ABNORMAL LOW (ref 11.6–15.9)
LYMPH%: 17.5 % (ref 14.0–49.7)
MCH: 32.6 pg (ref 25.1–34.0)
MCHC: 33.6 g/dL (ref 31.5–36.0)
MCV: 97 fL (ref 79.5–101.0)
MONO#: 0.6 10*3/uL (ref 0.1–0.9)
MONO%: 10.7 % (ref 0.0–14.0)
NEUT#: 3.7 10*3/uL (ref 1.5–6.5)
NEUT%: 70.3 % (ref 38.4–76.8)
PLATELETS: 385 10*3/uL (ref 145–400)
RBC: 3.36 10*6/uL — AB (ref 3.70–5.45)
RDW: 17.5 % — ABNORMAL HIGH (ref 11.2–14.5)
WBC: 5.2 10*3/uL (ref 3.9–10.3)
lymph#: 0.9 10*3/uL (ref 0.9–3.3)

## 2013-11-29 LAB — COMPREHENSIVE METABOLIC PANEL (CC13)
ALK PHOS: 69 U/L (ref 40–150)
ALT: 15 U/L (ref 0–55)
AST: 19 U/L (ref 5–34)
Albumin: 3.3 g/dL — ABNORMAL LOW (ref 3.5–5.0)
Anion Gap: 9 mEq/L (ref 3–11)
BILIRUBIN TOTAL: 0.22 mg/dL (ref 0.20–1.20)
BUN: 10.8 mg/dL (ref 7.0–26.0)
CO2: 28 mEq/L (ref 22–29)
Calcium: 9.8 mg/dL (ref 8.4–10.4)
Chloride: 104 mEq/L (ref 98–109)
Creatinine: 0.8 mg/dL (ref 0.6–1.1)
Glucose: 99 mg/dl (ref 70–140)
Potassium: 4.5 mEq/L (ref 3.5–5.1)
Sodium: 140 mEq/L (ref 136–145)
Total Protein: 6.6 g/dL (ref 6.4–8.3)

## 2013-11-29 MED ORDER — FOSAPREPITANT DIMEGLUMINE INJECTION 150 MG
150.0000 mg | Freq: Once | INTRAVENOUS | Status: AC
  Administered 2013-11-29: 150 mg via INTRAVENOUS
  Filled 2013-11-29: qty 5

## 2013-11-29 MED ORDER — SODIUM CHLORIDE 0.9 % IV SOLN
500.0000 mg/m2 | Freq: Once | INTRAVENOUS | Status: AC
  Administered 2013-11-29: 940 mg via INTRAVENOUS
  Filled 2013-11-29: qty 47

## 2013-11-29 MED ORDER — PALONOSETRON HCL INJECTION 0.25 MG/5ML
0.2500 mg | Freq: Once | INTRAVENOUS | Status: AC
  Administered 2013-11-29: 0.25 mg via INTRAVENOUS

## 2013-11-29 MED ORDER — PALONOSETRON HCL INJECTION 0.25 MG/5ML
INTRAVENOUS | Status: AC
  Filled 2013-11-29: qty 5

## 2013-11-29 MED ORDER — DEXAMETHASONE SODIUM PHOSPHATE 20 MG/5ML IJ SOLN
INTRAMUSCULAR | Status: AC
  Filled 2013-11-29: qty 5

## 2013-11-29 MED ORDER — SODIUM CHLORIDE 0.9 % IV SOLN
Freq: Once | INTRAVENOUS | Status: AC
  Administered 2013-11-29: 12:00:00 via INTRAVENOUS

## 2013-11-29 MED ORDER — FLUOROURACIL CHEMO INJECTION 2.5 GM/50ML
500.0000 mg/m2 | Freq: Once | INTRAVENOUS | Status: AC
  Administered 2013-11-29: 950 mg via INTRAVENOUS
  Filled 2013-11-29: qty 19

## 2013-11-29 MED ORDER — SODIUM CHLORIDE 0.9 % IV SOLN
Freq: Once | INTRAVENOUS | Status: AC
  Administered 2013-11-29: 11:00:00 via INTRAVENOUS

## 2013-11-29 MED ORDER — EPIRUBICIN HCL CHEMO IV INJECTION 200 MG/100ML
100.0000 mg/m2 | Freq: Once | INTRAVENOUS | Status: AC
  Administered 2013-11-29: 186 mg via INTRAVENOUS
  Filled 2013-11-29: qty 93

## 2013-11-29 MED ORDER — HEPARIN SOD (PORK) LOCK FLUSH 100 UNIT/ML IV SOLN
500.0000 [IU] | Freq: Once | INTRAVENOUS | Status: AC | PRN
  Administered 2013-11-29: 500 [IU]
  Filled 2013-11-29: qty 5

## 2013-11-29 MED ORDER — SODIUM CHLORIDE 0.9 % IJ SOLN
10.0000 mL | INTRAMUSCULAR | Status: DC | PRN
  Administered 2013-11-29: 10 mL
  Filled 2013-11-29: qty 10

## 2013-11-29 MED ORDER — DEXAMETHASONE SODIUM PHOSPHATE 20 MG/5ML IJ SOLN
12.0000 mg | Freq: Once | INTRAMUSCULAR | Status: AC
  Administered 2013-11-29: 12 mg via INTRAVENOUS

## 2013-11-29 NOTE — Patient Instructions (Signed)
Dehydration, Adult Dehydration is when you lose more fluids from the body than you take in. Vital organs like the kidneys, brain, and heart cannot function without a proper amount of fluids and salt. Any loss of fluids from the body can cause dehydration.  CAUSES   Vomiting.  Diarrhea.  Excessive sweating.  Excessive urine output.  Fever. SYMPTOMS  Mild dehydration  Thirst.  Dry lips.  Slightly dry mouth. Moderate dehydration  Very dry mouth.  Sunken eyes.  Skin does not bounce back quickly when lightly pinched and released.  Dark urine and decreased urine production.  Decreased tear production.  Headache. Severe dehydration  Very dry mouth.  Extreme thirst.  Rapid, weak pulse (more than 100 beats per minute at rest).  Cold hands and feet.  Not able to sweat in spite of heat and temperature.  Rapid breathing.  Blue lips.  Confusion and lethargy.  Difficulty being awakened.  Minimal urine production.  No tears. DIAGNOSIS  Your caregiver will diagnose dehydration based on your symptoms and your exam. Blood and urine tests will help confirm the diagnosis. The diagnostic evaluation should also identify the cause of dehydration. TREATMENT  Treatment of mild or moderate dehydration can often be done at home by increasing the amount of fluids that you drink. It is best to drink small amounts of fluid more often. Drinking too much at one time can make vomiting worse. Refer to the home care instructions below. Severe dehydration needs to be treated at the hospital where you will probably be given intravenous (IV) fluids that contain water and electrolytes. HOME CARE INSTRUCTIONS   Ask your caregiver about specific rehydration instructions.  Drink enough fluids to keep your urine clear or pale yellow.  Drink small amounts frequently if you have nausea and vomiting.  Eat as you normally do.  Avoid:  Foods or drinks high in sugar.  Carbonated  drinks.  Juice.  Extremely hot or cold fluids.  Drinks with caffeine.  Fatty, greasy foods.  Alcohol.  Tobacco.  Overeating.  Gelatin desserts.  Wash your hands well to avoid spreading bacteria and viruses.  Only take over-the-counter or prescription medicines for pain, discomfort, or fever as directed by your caregiver.  Ask your caregiver if you should continue all prescribed and over-the-counter medicines.  Keep all follow-up appointments with your caregiver. SEEK MEDICAL CARE IF:  You have abdominal pain and it increases or stays in one area (localizes).  You have a rash, stiff neck, or severe headache.  You are irritable, sleepy, or difficult to awaken.  You are weak, dizzy, or extremely thirsty. SEEK IMMEDIATE MEDICAL CARE IF:   You are unable to keep fluids down or you get worse despite treatment.  You have frequent episodes of vomiting or diarrhea.  You have blood or green matter (bile) in your vomit.  You have blood in your stool or your stool looks black and tarry.  You have not urinated in 6 to 8 hours, or you have only urinated a small amount of very dark urine.  You have a fever.  You faint. MAKE SURE YOU:   Understand these instructions.  Will watch your condition.  Will get help right away if you are not doing well or get worse. Document Released: 05/20/2005 Document Revised: 08/12/2011 Document Reviewed: 01/07/2011 ExitCare Patient Information 2015 [employer]. This information is not intended to replace advice given to you by your health care provider. Make sure you discuss any questions you have with your health care   provider.  

## 2013-11-29 NOTE — Progress Notes (Signed)
ID: Wendy [name] OB: May 01, 1946  MR#: [id number]  ZYS#:[id number]  PCP: PROVIDER NOT IN SYSTEM GYN:   SU: Dr. 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   BREAST CANCER HISTORY: Patient went for an annual mammogram and she was found to have an abnormality in the right breast at the 3:00 position. Subsequently had a right diagnostic mammogram performed. She also had a image guided biopsy of the right breast at the 3:00 position. 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 The tumor was ER negative PR negative HER-2/neu negative with a proliferation marker Ki-67 13%. All of her workup initially was performed in [place]. She subsequently was referred to Dr. Fanny Skates who saw her on 08/04/2003. He has recommended MRI of the breasts. This was performed on 08/09/2013. In the right breast there was an irregular enhancing mass in the 3:00 region measuring 1.5 x 1.0 x 1.4 cm. Also noted was nodular segmental enhancement extending 6.1 cm posteriorly from the mass towards the chest wall. The mass and nodular enhancement measured 7 cm. In the middle third of the lower outer quadrant of the right breast there was a 1.0 x 0.7 x 0.7 cm area of focal non-masslike enhancement. Left breast revealed no masses or abnormal enhancements. Lymph nodes appeared normal.  CURRENT THERAPY: Fluorouracil, Epirubicin, Cyclophosphamide cycle 4 day 1  INTERVAL HISTORY:  Wendy [name] is here today for evaluation for day 1 cycle 4 of FEC chemotherapy.  It is given on day 1 of a 21 day cycle with Neulasta given on day 2 for granulocyte support.    She is feeling much improved this week, as compared to how she felt when she was evaluated a couple of weeks ago.  She denies any current fevers, chills, nausea, vomiting, constipation, diarrhea, numbness, or any further  concerns.    REVIEW OF SYSTEMS: A 10 point review of systems was conducted and is otherwise negative except for what is noted above.    PAST MEDICAL HISTORY: Past Medical History  Diagnosis Date  . Family history of anesthesia complication     "daughter PONV; long time in recovery" (09/07/2013)  . Asthma     as a child  . Asthmatic bronchitis     "about q yr" (09/07/2013)  . Pneumonia     "several times; usually follows asthmatic bronchitis" (09/07/2013)  . Sinus headache     "maybe 2-3 times/month"  . Breast cancer     "right breast"     PAST SURGICAL HISTORY: Past Surgical History  Procedure Laterality Date  . Tubal ligation  1977  . Finger fracture surgery Left 2010    "middle finger spiral fx; little finger broken; tissue damage"  . Tonsillectomy    . Portacath placement Right 09/07/2013  . Mastectomy complete / simple w/ sentinel node biopsy Right 09/07/2013  . Mastectomy complete / simple  09/07/2013  . Breast biopsy Right 07/2013  . Dilation and curettage of uterus      S/P miscarriage  . Wrist fracture surgery Left 2010  . Mastectomy w/ sentinel node biopsy Bilateral 09/07/2013    Procedure: BILATERAL MASTECTOMY WITH RIGHT SENTINEL LYMPH NODE BIOPSY;  Surgeon: [name], MD;  Location: [facility];  Service: General;  Laterality: Bilateral;  . Portacath placement Right 09/07/2013    Procedure: INSERTION PORT-A-CATH;  Surgeon: [name], MD;  Location: [facility]  OR;  Service: General;  Laterality: Right;    FAMILY HISTORY Family History  Problem Relation Age of Onset  . Bladder Cancer Sister 43    worked in [employer], around 2nd hand smoke all her life  . Diabetes Sister   . Colon cancer Brother 44    non-smoker  . Diabetes Brother   . Colon cancer Maternal Uncle 37  . Osteoporosis Mother   . Parkinson's disease Mother   . Diabetes Mother   . Rheum arthritis Father   . Diabetes Father   . Breast cancer Paternal Aunt     dx over 7  . Diabetes Maternal Grandmother   .  Diabetes Maternal Grandfather   . Uterine cancer Other 50  . Colon cancer Maternal Aunt     dx in her 47s-70s  . Breast cancer Cousin     paternal cousin dx in her 71s-70s    GYNECOLOGIC HISTORY: menarche at age 45, G96, P2, patient took HRT for 4-5 years post menopause without problems, no h/o abnormal pap smears or sexually transmitted infections.    SOCIAL HISTORY:  Lives with husband of 37 years.  Patient is retired.  No tobacco, ETOH, illicit drug use.    ADVANCED DIRECTIVES:  In place, HCPOA husband [name] who can be reached at [phone].     HEALTH MAINTENANCE: History  Substance Use Topics  . Smoking status: Never Smoker   . Smokeless tobacco: Never Used  . Alcohol Use: No      Mammogram: 07/2013 Colonoscopy: 2013 Bone Density Scan: 08/2013 Pap Smear: 2013 Eye Exam: 2012 Vitamin D Level: unknown Lipid Panel: patient unsure likely 3-4 years ago   No Known Allergies  Current Outpatient Prescriptions  Medication Sig Dispense Refill  . azithromycin (ZITHROMAX) 500 MG tablet       . b complex vitamins tablet Take 1 tablet by mouth daily.      . Calcium Carbonate-Vitamin D (CALCIUM PLUS VITAMIN D PO) Take 1 tablet by mouth 2 (two) times daily.      Marland Kitchen dexamethasone (DECADRON) 4 MG tablet Take 2 tablets by mouth once a day on the day after chemotherapy and then take 2 tablets two times a day for 2 days. Take with food.  30 tablet  1  . lidocaine-prilocaine (EMLA) cream Apply 1 application topically as needed.  30 g  1  . omeprazole (PRILOSEC) 40 MG capsule Take 1 capsule (40 mg total) by mouth daily.  30 capsule  5  . prochlorperazine (COMPAZINE) 10 MG tablet TAKE ONE TABLET EVERY 6 HOURS AS NEEDED FOR NAUSEA AND VOMITING  30 tablet  1  . UNABLE TO FIND [id number] Post mastectomy camisole QTY: 2  Diagnosis Code 174.9  2 each  3  . zinc gluconate 50 MG tablet Take 50 mg by mouth daily.      . Alum & Mag Hydroxide-Simeth (MAGIC MOUTHWASH) SOLN SWISH AND spit ONE TEASPOONFUL  BY MOUTH FOUR TIMES DAILY AS NEEDED.  240 mL  1  . CHERATUSSIN AC 100-10 MG/5ML syrup       . lidocaine (XYLOCAINE) 2 % solution Use as directed 5 mLs in the mouth or throat 4 (four) times daily as needed for mouth pain (use with current supply of MMW).  120 mL  1  . LORazepam (ATIVAN) 0.5 MG tablet TAKE ONE TABLET EVERY 6 HOURS AS NEEDED FOR NAUSEA AND VOMITING  30 tablet  0  . ondansetron (ZOFRAN) 8 MG tablet Take 1 tablet (8 mg total)  by mouth 2 (two) times daily as needed. Start on the third day after chemotherapy.  30 tablet  1   No current facility-administered medications for this visit.    OBJECTIVE: Filed Vitals:   11/29/13 0945  BP: 100/66  Pulse: 80  Temp: 97 F (36.1 C)  Resp: 18     Body mass index is 24.85 kg/(m^2).     GENERAL: Patient is well appearing and in no acute distress.  HEENT:  Sclerae anicteric.  Oropharynx clear and moist. No ulcerations or evidence of oropharyngeal candidiasis. Neck is supple.  NODES:  No cervical, supraclavicular, or axillary lymphadenopathy palpated.  BREAST EXAM:  Deferred. LUNGS:  Clear to auscultation bilaterally.  No wheezes or rhonchi. HEART:  Regular rate and rhythm. No murmur appreciated. ABDOMEN:  Soft, nontender.  Positive, normoactive bowel sounds. No organomegaly palpated. MSK:  No focal spinal tenderness to palpation. Full range of motion bilaterally in the upper extremities. EXTREMITIES:  No peripheral edema.   SKIN:  Clear with no obvious rashes or skin changes. No nail dyscrasia. NEURO:  Nonfocal. Well oriented.  Appropriate affect. ECOG FS:1  LAB RESULTS:  CMP     Component Value Date/Time   NA 140 11/29/2013 0934   NA 141 08/31/2013 1038   K 4.5 11/29/2013 0934   K 4.7 08/31/2013 1038   CL 101 08/31/2013 1038   CO2 28 11/29/2013 0934   CO2 27 08/31/2013 1038   GLUCOSE 99 11/29/2013 0934   GLUCOSE 99 08/31/2013 1038   BUN 10.8 11/29/2013 0934   BUN 18 08/31/2013 1038   CREATININE 0.8 11/29/2013 0934   CREATININE 0.75  09/07/2013 1652   CALCIUM 9.8 11/29/2013 0934   CALCIUM 9.8 08/31/2013 1038   PROT 6.6 11/29/2013 0934   PROT 7.4 08/31/2013 1038   ALBUMIN 3.3* 11/29/2013 0934   ALBUMIN 4.0 08/31/2013 1038   AST 19 11/29/2013 0934   AST 22 08/31/2013 1038   ALT 15 11/29/2013 0934   ALT 17 08/31/2013 1038   ALKPHOS 69 11/29/2013 0934   ALKPHOS 70 08/31/2013 1038   BILITOT 0.22 11/29/2013 0934   BILITOT 0.3 08/31/2013 1038   GFRNONAA 84* 09/07/2013 1652   GFRAA >90 09/07/2013 1652    I No results found for this basename: SPEP,  UPEP,   kappa and lambda light chains    Lab Results  Component Value Date   WBC 5.2 11/29/2013   NEUTROABS 3.7 11/29/2013   HGB 10.9* 11/29/2013   HCT 32.6* 11/29/2013   MCV 97.0 11/29/2013   PLT 385 11/29/2013      Chemistry      Component Value Date/Time   NA 140 11/29/2013 0934   NA 141 08/31/2013 1038   K 4.5 11/29/2013 0934   K 4.7 08/31/2013 1038   CL 101 08/31/2013 1038   CO2 28 11/29/2013 0934   CO2 27 08/31/2013 1038   BUN 10.8 11/29/2013 0934   BUN 18 08/31/2013 1038   CREATININE 0.8 11/29/2013 0934   CREATININE 0.75 09/07/2013 1652      Component Value Date/Time   CALCIUM 9.8 11/29/2013 0934   CALCIUM 9.8 08/31/2013 1038   ALKPHOS 69 11/29/2013 0934   ALKPHOS 70 08/31/2013 1038   AST 19 11/29/2013 0934   AST 22 08/31/2013 1038   ALT 15 11/29/2013 0934   ALT 17 08/31/2013 1038   BILITOT 0.22 11/29/2013 0934   BILITOT 0.3 08/31/2013 1038       No results found for this basename: LABCA2  No components found with this basename: LJQGB201    No results found for this basename: INR,  in the last 168 hours  Urinalysis    Component Value Date/Time   COLORURINE YELLOW 08/31/2013 [place] 08/31/2013 1018   LABSPEC 1.020 11/16/2013 1159   LABSPEC 1.007 08/31/2013 1018   PHURINE 6.5 08/31/2013 1018   GLUCOSEU 500 11/16/2013 1159   GLUCOSEU NEGATIVE 08/31/2013 1018   HGBUR NEGATIVE 08/31/2013 1018   BILIRUBINUR NEGATIVE 08/31/2013 1018   KETONESUR NEGATIVE 08/31/2013  1018   PROTEINUR NEGATIVE 08/31/2013 1018   UROBILINOGEN 0.2 11/16/2013 1159   UROBILINOGEN 0.2 08/31/2013 1018   NITRITE NEGATIVE 08/31/2013 1018   LEUKOCYTESUR NEGATIVE 08/31/2013 1018    STUDIES: No results found.  ASSESSMENT: 68 y.o. [place] woman with T1 N0, stage IA, invasive ductal carcinoma, grade 2, ER negative, PR negative, Ki-67 of 13%, HER-2/neu negative.    1. The patient underwent bilateral mastectomy by Dr. Dalbert Batman on 09/07/2013, and a right port-a-cath was also placed at that time. Pathology revealed a grade 2, 1.7 cm invasive ductal carcinoma. Two lymph nodes were negative for malignancy. The tumor was ER negative, PR negative, HER-2/neu negative. Ki-67 was 13%.   2. Adjuvant Fluorouracil, Epirubicin, and Cyclophosphamide given on day 1 of a 21 day cycle with Neulasta given as granulocyte support was started on 09/27/13. A total of 6 cycles are planned.   PLAN:   Wendy [name] is feeling much improved today.  Her labs are stable, I reviewed them with her in detail.  She has had significant nausea and a food aversion with her treatment.  She has required several days of IV hydration beginning day 8 of her treatment cycles.  We discussed this in detail.  She will proceed with cycle 4 of FEC today, however, she will receive IV hydration today, tomorrow, and again on Thursday, 12/02/13.  Should she continue to have dehydration despite these interventions, then she may need either a dose reduction or to go ahead and switch to her next chemotherapy regimen.    Wendy [name] will return for IV fluids/neulasta tomorrow, for IV fluids on 7/2, and for labs, evaluation and IV fluids on 12/06/13.    She knows to call us in the interim for any questions or concerns.  We can certainly see her sooner if needed.  I spent 25 minutes counseling the patient face to face.  The total time spent in the appointment was 30 minutes.  Minette Headland, [place] [phone] 11/29/2013 10:08 AM

## 2013-11-29 NOTE — Telephone Encounter (Signed)
Per staff message and POF I have scheduled appts. Advised scheduler of appts. JMW  

## 2013-11-29 NOTE — Telephone Encounter (Signed)
, °

## 2013-11-29 NOTE — Progress Notes (Signed)
Nutrition followup completed with patient.  Patient's weight documented as 153.9 pounds June 29 increased from 151.8 pounds June 16.  Patient reports she can tolerate Unjury chicken soup for an additional 100 calories, and 20 g protein.  She is drinking a protein shake, but does not know the name of it.  She does not tolerate ensure or boost.  Patient still has taste alterations.  She is working on strategies for increased oral intake.  Nutrition diagnosis: Inadequate oral intake improved.  Intervention:  Provided patient with additional samples of Unjury protein powder along with samples of Enu and Orgain.   Continue nutrition supplements.   Encouraged patient to continue strategies for increased oral intake and weight stabilization.   Teach back method used.  Monitoring, evaluation, goals: Patient has been able to increase oral intake for a 2 pound weight gain.  Next visit: Monday, July 20, during chemotherapy.

## 2013-11-30 ENCOUNTER — Ambulatory Visit (HOSPITAL_BASED_OUTPATIENT_CLINIC_OR_DEPARTMENT_OTHER): Payer: Commercial Managed Care - HMO

## 2013-11-30 ENCOUNTER — Ambulatory Visit: Payer: Commercial Managed Care - HMO

## 2013-11-30 ENCOUNTER — Other Ambulatory Visit: Payer: Self-pay | Admitting: *Deleted

## 2013-11-30 VITALS — BP 93/58 | HR 75 | Temp 97.3°F

## 2013-11-30 DIAGNOSIS — E860 Dehydration: Secondary | ICD-10-CM

## 2013-11-30 DIAGNOSIS — C50211 Malignant neoplasm of upper-inner quadrant of right female breast: Secondary | ICD-10-CM

## 2013-11-30 DIAGNOSIS — C50919 Malignant neoplasm of unspecified site of unspecified female breast: Secondary | ICD-10-CM

## 2013-11-30 DIAGNOSIS — Z5189 Encounter for other specified aftercare: Secondary | ICD-10-CM

## 2013-11-30 MED ORDER — SODIUM CHLORIDE 0.9 % IV SOLN
1000.0000 mL | Freq: Once | INTRAVENOUS | Status: AC
  Administered 2013-11-30: 16:00:00 via INTRAVENOUS

## 2013-11-30 MED ORDER — PEGFILGRASTIM INJECTION 6 MG/0.6ML
6.0000 mg | Freq: Once | SUBCUTANEOUS | Status: AC
  Administered 2013-11-30: 6 mg via SUBCUTANEOUS
  Filled 2013-11-30: qty 0.6

## 2013-11-30 NOTE — Patient Instructions (Signed)
Dehydration, Adult Dehydration is when you lose more fluids from the body than you take in. Vital organs like the kidneys, brain, and heart cannot function without a proper amount of fluids and salt. Any loss of fluids from the body can cause dehydration.  CAUSES   Vomiting.  Diarrhea.  Excessive sweating.  Excessive urine output.  Fever. SYMPTOMS  Mild dehydration  Thirst.  Dry lips.  Slightly dry mouth. Moderate dehydration  Very dry mouth.  Sunken eyes.  Skin does not bounce back quickly when lightly pinched and released.  Dark urine and decreased urine production.  Decreased tear production.  Headache. Severe dehydration  Very dry mouth.  Extreme thirst.  Rapid, weak pulse (more than 100 beats per minute at rest).  Cold hands and feet.  Not able to sweat in spite of heat and temperature.  Rapid breathing.  Blue lips.  Confusion and lethargy.  Difficulty being awakened.  Minimal urine production.  No tears. DIAGNOSIS  Your caregiver will diagnose dehydration based on your symptoms and your exam. Blood and urine tests will help confirm the diagnosis. The diagnostic evaluation should also identify the cause of dehydration. TREATMENT  Treatment of mild or moderate dehydration can often be done at home by increasing the amount of fluids that you drink. It is best to drink small amounts of fluid more often. Drinking too much at one time can make vomiting worse. Refer to the home care instructions below. Severe dehydration needs to be treated at the hospital where you will probably be given intravenous (IV) fluids that contain water and electrolytes. HOME CARE INSTRUCTIONS   Ask your caregiver about specific rehydration instructions.  Drink enough fluids to keep your urine clear or pale yellow.  Drink small amounts frequently if you have nausea and vomiting.  Eat as you normally do.  Avoid:  Foods or drinks high in sugar.  Carbonated  drinks.  Juice.  Extremely hot or cold fluids.  Drinks with caffeine.  Fatty, greasy foods.  Alcohol.  Tobacco.  Overeating.  Gelatin desserts.  Wash your hands well to avoid spreading bacteria and viruses.  Only take over-the-counter or prescription medicines for pain, discomfort, or fever as directed by your caregiver.  Ask your caregiver if you should continue all prescribed and over-the-counter medicines.  Keep all follow-up appointments with your caregiver. SEEK MEDICAL CARE IF:  You have abdominal pain and it increases or stays in one area (localizes).  You have a rash, stiff neck, or severe headache.  You are irritable, sleepy, or difficult to awaken.  You are weak, dizzy, or extremely thirsty. SEEK IMMEDIATE MEDICAL CARE IF:   You are unable to keep fluids down or you get worse despite treatment.  You have frequent episodes of vomiting or diarrhea.  You have blood or green matter (bile) in your vomit.  You have blood in your stool or your stool looks black and tarry.  You have not urinated in 6 to 8 hours, or you have only urinated a small amount of very dark urine.  You have a fever.  You faint. MAKE SURE YOU:   Understand these instructions.  Will watch your condition.  Will get help right away if you are not doing well or get worse. Document Released: 05/20/2005 Document Revised: 08/12/2011 Document Reviewed: 01/07/2011 ExitCare Patient Information 2015 [employer]. This information is not intended to replace advice given to you by your health care provider. Make sure you discuss any questions you have with your health care   provider.  

## 2013-12-02 ENCOUNTER — Ambulatory Visit (HOSPITAL_BASED_OUTPATIENT_CLINIC_OR_DEPARTMENT_OTHER): Payer: Commercial Managed Care - HMO

## 2013-12-02 VITALS — BP 96/49 | HR 86 | Temp 98.8°F

## 2013-12-02 DIAGNOSIS — C50219 Malignant neoplasm of upper-inner quadrant of unspecified female breast: Secondary | ICD-10-CM

## 2013-12-02 DIAGNOSIS — C50211 Malignant neoplasm of upper-inner quadrant of right female breast: Secondary | ICD-10-CM

## 2013-12-02 DIAGNOSIS — E860 Dehydration: Secondary | ICD-10-CM

## 2013-12-02 DIAGNOSIS — R110 Nausea: Secondary | ICD-10-CM

## 2013-12-02 MED ORDER — SODIUM CHLORIDE 0.9 % IJ SOLN
10.0000 mL | INTRAMUSCULAR | Status: DC | PRN
  Administered 2013-12-02: 10 mL
  Filled 2013-12-02: qty 10

## 2013-12-02 MED ORDER — HEPARIN SOD (PORK) LOCK FLUSH 100 UNIT/ML IV SOLN
500.0000 [IU] | Freq: Once | INTRAVENOUS | Status: AC | PRN
  Administered 2013-12-02: 500 [IU]
  Filled 2013-12-02: qty 5

## 2013-12-02 MED ORDER — SODIUM CHLORIDE 0.9 % IV SOLN
Freq: Once | INTRAVENOUS | Status: AC
  Administered 2013-12-02: 15:00:00 via INTRAVENOUS

## 2013-12-02 MED ORDER — PROMETHAZINE HCL 25 MG/ML IJ SOLN
12.5000 mg | Freq: Once | INTRAMUSCULAR | Status: AC
Start: 2013-12-02 — End: 2013-12-02
  Administered 2013-12-02: 12.5 mg via INTRAVENOUS
  Filled 2013-12-02: qty 1

## 2013-12-02 NOTE — Patient Instructions (Signed)
Dehydration, Adult Dehydration is when you lose more fluids from the body than you take in. Vital organs like the kidneys, brain, and heart cannot function without a proper amount of fluids and salt. Any loss of fluids from the body can cause dehydration.  CAUSES   Vomiting.  Diarrhea.  Excessive sweating.  Excessive urine output.  Fever. SYMPTOMS  Mild dehydration  Thirst.  Dry lips.  Slightly dry mouth. Moderate dehydration  Very dry mouth.  Sunken eyes.  Skin does not bounce back quickly when lightly pinched and released.  Dark urine and decreased urine production.  Decreased tear production.  Headache. Severe dehydration  Very dry mouth.  Extreme thirst.  Rapid, weak pulse (more than 100 beats per minute at rest).  Cold hands and feet.  Not able to sweat in spite of heat and temperature.  Rapid breathing.  Blue lips.  Confusion and lethargy.  Difficulty being awakened.  Minimal urine production.  No tears. DIAGNOSIS  Your caregiver will diagnose dehydration based on your symptoms and your exam. Blood and urine tests will help confirm the diagnosis. The diagnostic evaluation should also identify the cause of dehydration. TREATMENT  Treatment of mild or moderate dehydration can often be done at home by increasing the amount of fluids that you drink. It is best to drink small amounts of fluid more often. Drinking too much at one time can make vomiting worse. Refer to the home care instructions below. Severe dehydration needs to be treated at the hospital where you will probably be given intravenous (IV) fluids that contain water and electrolytes. HOME CARE INSTRUCTIONS   Ask your caregiver about specific rehydration instructions.  Drink enough fluids to keep your urine clear or pale yellow.  Drink small amounts frequently if you have nausea and vomiting.  Eat as you normally do.  Avoid:  Foods or drinks high in sugar.  Carbonated  drinks.  Juice.  Extremely hot or cold fluids.  Drinks with caffeine.  Fatty, greasy foods.  Alcohol.  Tobacco.  Overeating.  Gelatin desserts.  Wash your hands well to avoid spreading bacteria and viruses.  Only take over-the-counter or prescription medicines for pain, discomfort, or fever as directed by your caregiver.  Ask your caregiver if you should continue all prescribed and over-the-counter medicines.  Keep all follow-up appointments with your caregiver. SEEK MEDICAL CARE IF:  You have abdominal pain and it increases or stays in one area (localizes).  You have a rash, stiff neck, or severe headache.  You are irritable, sleepy, or difficult to awaken.  You are weak, dizzy, or extremely thirsty. SEEK IMMEDIATE MEDICAL CARE IF:   You are unable to keep fluids down or you get worse despite treatment.  You have frequent episodes of vomiting or diarrhea.  You have blood or green matter (bile) in your vomit.  You have blood in your stool or your stool looks black and tarry.  You have not urinated in 6 to 8 hours, or you have only urinated a small amount of very dark urine.  You have a fever.  You faint. MAKE SURE YOU:   Understand these instructions.  Will watch your condition.  Will get help right away if you are not doing well or get worse. Document Released: 05/20/2005 Document Revised: 08/12/2011 Document Reviewed: 01/07/2011 ExitCare Patient Information 2015 [employer]. This information is not intended to replace advice given to you by your health care provider. Make sure you discuss any questions you have with your health care   provider.  

## 2013-12-06 ENCOUNTER — Ambulatory Visit (HOSPITAL_BASED_OUTPATIENT_CLINIC_OR_DEPARTMENT_OTHER): Payer: Commercial Managed Care - HMO | Admitting: Oncology

## 2013-12-06 ENCOUNTER — Other Ambulatory Visit (HOSPITAL_BASED_OUTPATIENT_CLINIC_OR_DEPARTMENT_OTHER): Payer: Commercial Managed Care - HMO

## 2013-12-06 ENCOUNTER — Encounter: Payer: Self-pay | Admitting: Oncology

## 2013-12-06 ENCOUNTER — Ambulatory Visit: Payer: Commercial Managed Care - HMO

## 2013-12-06 VITALS — BP 99/54 | HR 84 | Temp 98.1°F | Resp 18 | Ht 66.0 in | Wt 152.4 lb

## 2013-12-06 VITALS — BP 99/47 | HR 82

## 2013-12-06 DIAGNOSIS — C50211 Malignant neoplasm of upper-inner quadrant of right female breast: Secondary | ICD-10-CM

## 2013-12-06 DIAGNOSIS — Z171 Estrogen receptor negative status [ER-]: Secondary | ICD-10-CM

## 2013-12-06 DIAGNOSIS — C50919 Malignant neoplasm of unspecified site of unspecified female breast: Secondary | ICD-10-CM

## 2013-12-06 DIAGNOSIS — R5383 Other fatigue: Secondary | ICD-10-CM

## 2013-12-06 DIAGNOSIS — D709 Neutropenia, unspecified: Secondary | ICD-10-CM

## 2013-12-06 DIAGNOSIS — R5381 Other malaise: Secondary | ICD-10-CM

## 2013-12-06 LAB — COMPREHENSIVE METABOLIC PANEL (CC13)
ALBUMIN: 3.4 g/dL — AB (ref 3.5–5.0)
ALT: 21 U/L (ref 0–55)
AST: 15 U/L (ref 5–34)
Alkaline Phosphatase: 69 U/L (ref 40–150)
Anion Gap: 7 mEq/L (ref 3–11)
BUN: 18.9 mg/dL (ref 7.0–26.0)
CO2: 26 mEq/L (ref 22–29)
Calcium: 9.3 mg/dL (ref 8.4–10.4)
Chloride: 100 mEq/L (ref 98–109)
Creatinine: 0.7 mg/dL (ref 0.6–1.1)
Glucose: 111 mg/dl (ref 70–140)
POTASSIUM: 4 meq/L (ref 3.5–5.1)
SODIUM: 134 meq/L — AB (ref 136–145)
Total Bilirubin: 0.59 mg/dL (ref 0.20–1.20)
Total Protein: 6.3 g/dL — ABNORMAL LOW (ref 6.4–8.3)

## 2013-12-06 LAB — CBC WITH DIFFERENTIAL/PLATELET
BASO%: 0.8 % (ref 0.0–2.0)
Basophils Absolute: 0 10*3/uL (ref 0.0–0.1)
EOS ABS: 0 10*3/uL (ref 0.0–0.5)
EOS%: 2.4 % (ref 0.0–7.0)
HCT: 31.1 % — ABNORMAL LOW (ref 34.8–46.6)
HGB: 10.6 g/dL — ABNORMAL LOW (ref 11.6–15.9)
LYMPH%: 34.7 % (ref 14.0–49.7)
MCH: 32.4 pg (ref 25.1–34.0)
MCHC: 34.1 g/dL (ref 31.5–36.0)
MCV: 95.1 fL (ref 79.5–101.0)
MONO#: 0 10*3/uL — ABNORMAL LOW (ref 0.1–0.9)
MONO%: 2.4 % (ref 0.0–14.0)
NEUT%: 59.7 % (ref 38.4–76.8)
NEUTROS ABS: 0.7 10*3/uL — AB (ref 1.5–6.5)
Platelets: 101 10*3/uL — ABNORMAL LOW (ref 145–400)
RBC: 3.27 10*6/uL — AB (ref 3.70–5.45)
RDW: 15.8 % — ABNORMAL HIGH (ref 11.2–14.5)
WBC: 1.2 10*3/uL — ABNORMAL LOW (ref 3.9–10.3)
lymph#: 0.4 10*3/uL — ABNORMAL LOW (ref 0.9–3.3)

## 2013-12-06 MED ORDER — SODIUM CHLORIDE 0.9 % IV SOLN
Freq: Once | INTRAVENOUS | Status: AC
  Administered 2013-12-06: 12:00:00 via INTRAVENOUS

## 2013-12-06 MED ORDER — CIPROFLOXACIN HCL 500 MG PO TABS: 500.0000 mg | ORAL_TABLET | Freq: Two times a day (BID) | ORAL | Status: DC

## 2013-12-06 NOTE — Progress Notes (Signed)
ID: [name] OB: [birth year]  MR#: [id number]  CSN#:[id number]  PCP: PROVIDER NOT IN SYSTEM GYN:   SU: Dr. Dalbert Batman OTHER MD:  CHIEF COMPLAINT:[place] woman with T1 N0, stage IA, invasive ductal carcinoma, grade 2, ER negative, PR negative, Ki-67 of 13%, HER-2/neu negative.   BREAST CANCER HISTORY: Patient went for an annual mammogram and she was found to have an abnormality in the right breast at the 3:00 position. Subsequently had a right diagnostic mammogram performed. She also had a image guided biopsy of the right breast at the 3:00 position. 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 The tumor was ER negative PR negative HER-2/neu negative with a proliferation marker Ki-67 13%. All of her workup initially was performed in [place]. She subsequently was referred to Dr. Fanny Skates who saw her on 08/04/2003. He has recommended MRI of the breasts. This was performed on 08/09/2013. In the right breast there was an irregular enhancing mass in the 3:00 region measuring 1.5 x 1.0 x 1.4 cm. Also noted was nodular segmental enhancement extending 6.1 cm posteriorly from the mass towards the chest wall. The mass and nodular enhancement measured 7 cm. In the middle third of the lower outer quadrant of the right breast there was a 1.0 x 0.7 x 0.7 cm area of focal non-masslike enhancement. Left breast revealed no masses or abnormal enhancements. Lymph nodes appeared normal.  CURRENT THERAPY: Fluorouracil, Epirubicin, Cyclophosphamide cycle 4 day 8  INTERVAL HISTORY:  Mrs. Vallas is here today for follow-up after her 4th cycle of FEC chemotherapy.  It is given on day 1 of a 21 day cycle with Neulasta given on day 2 for granulocyte support.    She is having mild fatigue.  She denies any current fevers, chills, nausea, vomiting, constipation, diarrhea, numbness, or any further concerns. She is able to take the Enu supplementation and reports she's taking about  1200-1400 calories in per day. Her weight is stable.   REVIEW OF SYSTEMS: A 10 point review of systems was conducted and is otherwise negative except for what is noted above.    PAST MEDICAL HISTORY: Past Medical History  Diagnosis Date  . Family history of anesthesia complication     "daughter PONV; long time in recovery" (09/07/2013)  . Asthma     as a child  . Asthmatic bronchitis     "about q yr" (09/07/2013)  . Pneumonia     "several times; usually follows asthmatic bronchitis" (09/07/2013)  . Sinus headache     "maybe 2-3 times/month"  . Breast cancer     "right breast"     PAST SURGICAL HISTORY: Past Surgical History  Procedure Laterality Date  . Tubal ligation  1977  . Finger fracture surgery Left 2010    "middle finger spiral fx; little finger broken; tissue damage"  . Tonsillectomy    . Portacath placement Right 09/07/2013  . Mastectomy complete / simple w/ sentinel node biopsy Right 09/07/2013  . Mastectomy complete / simple  09/07/2013  . Breast biopsy Right 07/2013  . Dilation and curettage of uterus      S/P miscarriage  . Wrist fracture surgery Left 2010  . Mastectomy w/ sentinel node biopsy Bilateral 09/07/2013    Procedure: BILATERAL MASTECTOMY WITH RIGHT SENTINEL LYMPH NODE BIOPSY;  Surgeon: [name], MD;  Location: [facility];  Service: General;  Laterality: Bilateral;  . Portacath placement Right 09/07/2013    Procedure: INSERTION PORT-A-CATH;  Surgeon: [name], MD;  Location: [facility];  Service: General;  Laterality: Right;    FAMILY HISTORY Family History  Problem Relation Age of Onset  . Bladder Cancer Sister 19    worked in [employer], around 2nd hand smoke all her life  . Diabetes Sister   . Colon cancer Brother 43    non-smoker  . Diabetes Brother   . Colon cancer Maternal Uncle 47  . Osteoporosis Mother   . Parkinson's disease Mother   . Diabetes Mother   . Rheum arthritis Father   . Diabetes Father   . Breast cancer Paternal Aunt     dx over  51  . Diabetes Maternal Grandmother   . Diabetes Maternal Grandfather   . Uterine cancer Other 50  . Colon cancer Maternal Aunt     dx in her 37s-70s  . Breast cancer Cousin     paternal cousin dx in her 58s-70s    GYNECOLOGIC HISTORY: menarche at age 34, G56, P2, patient took HRT for 4-5 years post menopause without problems, no h/o abnormal pap smears or sexually transmitted infections.    SOCIAL HISTORY:  Lives with husband of 55 years.  Patient is retired.  No tobacco, ETOH, illicit drug use.    ADVANCED DIRECTIVES:  In place, HCPOA husband [name] who can be reached at [phone].     HEALTH MAINTENANCE: History  Substance Use Topics  . Smoking status: Never Smoker   . Smokeless tobacco: Never Used  . Alcohol Use: No      Mammogram: 07/2013 Colonoscopy: 2013 Bone Density Scan: 08/2013 Pap Smear: 2013 Eye Exam: 2012 Vitamin D Level: unknown Lipid Panel: patient unsure likely 3-4 years ago   No Known Allergies  Current Outpatient Prescriptions  Medication Sig Dispense Refill  . Alum & Mag Hydroxide-Simeth (MAGIC MOUTHWASH) SOLN SWISH AND spit ONE TEASPOONFUL BY MOUTH FOUR TIMES DAILY AS NEEDED.  240 mL  1  . b complex vitamins tablet Take 1 tablet by mouth daily.      . Calcium Carbonate-Vitamin D (CALCIUM PLUS VITAMIN D PO) Take 1 tablet by mouth 2 (two) times daily.      Marland Kitchen CHERATUSSIN AC 100-10 MG/5ML syrup       . dexamethasone (DECADRON) 4 MG tablet Take 2 tablets by mouth once a day on the day after chemotherapy and then take 2 tablets two times a day for 2 days. Take with food.  30 tablet  1  . lidocaine (XYLOCAINE) 2 % solution Use as directed 5 mLs in the mouth or throat 4 (four) times daily as needed for mouth pain (use with current supply of MMW).  120 mL  1  . lidocaine-prilocaine (EMLA) cream Apply 1 application topically as needed.  30 g  1  . LORazepam (ATIVAN) 0.5 MG tablet TAKE ONE TABLET EVERY 6 HOURS AS NEEDED FOR NAUSEA AND VOMITING  30 tablet  0  .  omeprazole (PRILOSEC) 40 MG capsule Take 1 capsule (40 mg total) by mouth daily.  30 capsule  5  . ondansetron (ZOFRAN) 8 MG tablet Take 1 tablet (8 mg total) by mouth 2 (two) times daily as needed. Start on the third day after chemotherapy.  30 tablet  1  . prochlorperazine (COMPAZINE) 10 MG tablet TAKE ONE TABLET EVERY 6 HOURS AS NEEDED FOR NAUSEA AND VOMITING  30 tablet  1  . UNABLE TO FIND [id number] Post mastectomy camisole QTY: 2  Diagnosis Code 174.9  2 each  3  . ciprofloxacin (CIPRO) 500 MG tablet  Take 1 tablet (500 mg total) by mouth 2 (two) times daily.  10 tablet  0   No current facility-administered medications for this visit.    OBJECTIVE: Filed Vitals:   12/06/13 1123  BP: 99/54  Pulse: 84  Temp: 98.1 F (36.7 C)  Resp: 18     Body mass index is 24.61 kg/(m^2).     GENERAL: Patient is well appearing and in no acute distress.  HEENT:  Sclerae anicteric.  Oropharynx clear and moist. No ulcerations or evidence of oropharyngeal candidiasis. Neck is supple.  NODES:  No cervical, supraclavicular, or axillary lymphadenopathy palpated.  BREAST EXAM:  Deferred. LUNGS:  Clear to auscultation bilaterally.  No wheezes or rhonchi. HEART:  Regular rate and rhythm. No murmur appreciated. ABDOMEN:  Soft, nontender.  Positive, normoactive bowel sounds. No organomegaly palpated. MSK:  No focal spinal tenderness to palpation. Full range of motion bilaterally in the upper extremities. EXTREMITIES:  No peripheral edema.   SKIN:  Clear with no obvious rashes or skin changes. No nail dyscrasia. NEURO:  Nonfocal. Well oriented.  Appropriate affect. ECOG FS:1  LAB RESULTS:  CMP     Component Value Date/Time   NA 134* 12/06/2013 1112   NA 141 08/31/2013 1038   K 4.0 12/06/2013 1112   K 4.7 08/31/2013 1038   CL 101 08/31/2013 1038   CO2 26 12/06/2013 1112   CO2 27 08/31/2013 1038   GLUCOSE 111 12/06/2013 1112   GLUCOSE 99 08/31/2013 1038   BUN 18.9 12/06/2013 1112   BUN 18 08/31/2013 1038    CREATININE 0.7 12/06/2013 1112   CREATININE 0.75 09/07/2013 1652   CALCIUM 9.3 12/06/2013 1112   CALCIUM 9.8 08/31/2013 1038   PROT 6.3* 12/06/2013 1112   PROT 7.4 08/31/2013 1038   ALBUMIN 3.4* 12/06/2013 1112   ALBUMIN 4.0 08/31/2013 1038   AST 15 12/06/2013 1112   AST 22 08/31/2013 1038   ALT 21 12/06/2013 1112   ALT 17 08/31/2013 1038   ALKPHOS 69 12/06/2013 1112   ALKPHOS 70 08/31/2013 1038   BILITOT 0.59 12/06/2013 1112   BILITOT 0.3 08/31/2013 1038   GFRNONAA 84* 09/07/2013 1652   GFRAA >90 09/07/2013 1652    I No results found for this basename: SPEP,  UPEP,   kappa and lambda light chains    Lab Results  Component Value Date   WBC 1.2* 12/06/2013   NEUTROABS 0.7* 12/06/2013   HGB 10.6* 12/06/2013   HCT 31.1* 12/06/2013   MCV 95.1 12/06/2013   PLT 101* 12/06/2013      Chemistry      Component Value Date/Time   NA 134* 12/06/2013 1112   NA 141 08/31/2013 1038   K 4.0 12/06/2013 1112   K 4.7 08/31/2013 1038   CL 101 08/31/2013 1038   CO2 26 12/06/2013 1112   CO2 27 08/31/2013 1038   BUN 18.9 12/06/2013 1112   BUN 18 08/31/2013 1038   CREATININE 0.7 12/06/2013 1112   CREATININE 0.75 09/07/2013 1652      Component Value Date/Time   CALCIUM 9.3 12/06/2013 1112   CALCIUM 9.8 08/31/2013 1038   ALKPHOS 69 12/06/2013 1112   ALKPHOS 70 08/31/2013 1038   AST 15 12/06/2013 1112   AST 22 08/31/2013 1038   ALT 21 12/06/2013 1112   ALT 17 08/31/2013 1038   BILITOT 0.59 12/06/2013 1112   BILITOT 0.3 08/31/2013 1038       No results found for this basename: LABCA2    No  components found with this basename: DAPTC052    No results found for this basename: INR,  in the last 168 hours  Urinalysis    Component Value Date/Time   COLORURINE YELLOW 08/31/2013 [place] 08/31/2013 1018   LABSPEC 1.020 11/16/2013 1159   LABSPEC 1.007 08/31/2013 1018   PHURINE 6.5 08/31/2013 1018   GLUCOSEU 500 11/16/2013 1159   GLUCOSEU NEGATIVE 08/31/2013 1018   HGBUR NEGATIVE 08/31/2013 1018   BILIRUBINUR NEGATIVE 08/31/2013 1018    KETONESUR NEGATIVE 08/31/2013 1018   PROTEINUR NEGATIVE 08/31/2013 1018   UROBILINOGEN 0.2 11/16/2013 1159   UROBILINOGEN 0.2 08/31/2013 1018   NITRITE NEGATIVE 08/31/2013 1018   LEUKOCYTESUR NEGATIVE 08/31/2013 1018    STUDIES: No results found.  ASSESSMENT: 68 y.o. [place] woman with T1 N0, stage IA, invasive ductal carcinoma, grade 2, ER negative, PR negative, Ki-67 of 13%, HER-2/neu negative.    1. The patient underwent bilateral mastectomy by Dr. Dalbert Batman on 09/07/2013, and a right port-a-cath was also placed at that time. Pathology revealed a grade 2, 1.7 cm invasive ductal carcinoma. Two lymph nodes were negative for malignancy. The tumor was ER negative, PR negative, HER-2/neu negative. Ki-67 was 13%.   2. Adjuvant Fluorouracil, Epirubicin, and Cyclophosphamide given on day 1 of a 21 day cycle with Neulasta given as granulocyte support was started on 09/27/13. A total of 6 cycles are planned.   PLAN:   Mrs. Long is tolerating her chemotherapy well.  We have reviewed her CBC today and I have discussed that she is neutropenic. We have discussed neutropenic precautions and she has been instructed to monitor her temperature and call for fever of 100.5 or higher. I have also prescribed Cipro 500 mg twice a day for 5 days.    Since her by mouth intake is slightly decreased, she will receive 1 L of normal saline today at our office. She states that she does not think that she needs any additional IV fluids after today.  We will plan to see her back in 2 weeks for her fifth cycle of chemotherapy.   She knows to call us in the interim for any questions or concerns.  We can certainly see her sooner if needed.  I spent 15 minutes counseling the patient face to face.  The total time spent in the appointment was 20 minutes.  Mikey Bussing, DNP, AGPCNP-BC  12/06/2013 1:11 PM

## 2013-12-06 NOTE — Patient Instructions (Signed)

## 2013-12-09 ENCOUNTER — Other Ambulatory Visit: Payer: Commercial Managed Care - HMO

## 2013-12-09 ENCOUNTER — Ambulatory Visit (HOSPITAL_BASED_OUTPATIENT_CLINIC_OR_DEPARTMENT_OTHER): Payer: Commercial Managed Care - HMO | Admitting: Genetic Counselor

## 2013-12-09 DIAGNOSIS — C50911 Malignant neoplasm of unspecified site of right female breast: Secondary | ICD-10-CM

## 2013-12-09 DIAGNOSIS — Z800 Family history of malignant neoplasm of digestive organs: Secondary | ICD-10-CM

## 2013-12-09 DIAGNOSIS — C50919 Malignant neoplasm of unspecified site of unspecified female breast: Secondary | ICD-10-CM

## 2013-12-09 DIAGNOSIS — IMO0002 Reserved for concepts with insufficient information to code with codable children: Secondary | ICD-10-CM

## 2013-12-09 DIAGNOSIS — Z8049 Family history of malignant neoplasm of other genital organs: Secondary | ICD-10-CM

## 2013-12-09 DIAGNOSIS — Z803 Family history of malignant neoplasm of breast: Secondary | ICD-10-CM

## 2013-12-09 DIAGNOSIS — Z8052 Family history of malignant neoplasm of bladder: Secondary | ICD-10-CM

## 2013-12-09 NOTE — Progress Notes (Signed)
Patient Name: Wendy [name] Patient Age: 68 y.o. Encounter Date: 12/09/2013    [name], a 68 y.o. female, is being seen at the [facility] due to a personal and family history of cancer.  She presents to clinic again with her husband to discuss whether additional genetic testing is warranted. [name] saw genetic counselor, Roma Kayser, on 08/17/13 for evaluation. She then returned to clinic and saw genetic counselor, Leda Quail, on 09/08/13 to discuss results of testing.  HISTORY OF PRESENT ILLNESS: [name] was diagnosed with right breast cancer (IDC) at the age of 13 that was ER/PR/HER2 negative. She is s/p bilateral mastectomies and is receiving chemotherapy.  She had negative genetic testing through [facility]. The 22 genes on the panel were ATM, BARD1, BRCA1, BRCA2, BRIP1, CDH1, CHEK2, EPCAM, FANCC, MLH1, MRE11A, MSH2, MSH6, NBN, NF1, PALB2, PMS2, PTEN, RAD50, RAD51C, STK11, and TP53. This lab was selected due to insurance reasons.   Past Surgical History  Procedure Laterality Date  . Tubal ligation  1977  . Finger fracture surgery Left 2010    "middle finger spiral fx; little finger broken; tissue damage"  . Tonsillectomy    . Portacath placement Right 09/07/2013  . Mastectomy complete / simple w/ sentinel node biopsy Right 09/07/2013  . Mastectomy complete / simple  09/07/2013  . Breast biopsy Right 07/2013  . Dilation and curettage of uterus      S/P miscarriage  . Wrist fracture surgery Left 2010  . Mastectomy w/ sentinel node biopsy Bilateral 09/07/2013    Procedure: BILATERAL MASTECTOMY WITH RIGHT SENTINEL LYMPH NODE BIOPSY;  Surgeon: [name], MD;  Location: [facility];  Service: General;  Laterality: Bilateral;  . Portacath placement Right 09/07/2013    Procedure: INSERTION PORT-A-CATH;  Surgeon: [name], MD;  Location: [facility];  Service: General;  Laterality: Right;    History   Social History  . Marital Status: Married    Spouse Name: N/A    Number of Children: 2  . Years of Education: N/A   Social History Main Topics  . Smoking status: Never Smoker   . Smokeless tobacco: Never Used  . Alcohol Use: No  . Drug Use: No  . Sexual Activity: No   Other Topics Concern  . Not on file   Social History Narrative  . No narrative on file     FAMILY HISTORY:   During the visit, a 4-generation pedigree was obtained. Significant diagnoses include the following:  Family History  Problem Relation Age of Onset  . Bladder Cancer Sister 66    worked in [employer], around 2nd hand smoke all her life  . Diabetes Sister   . Colon cancer Brother 73    non-smoker  . Diabetes Brother   . Colon cancer Maternal Uncle 51  . Osteoporosis Mother   . Parkinson's disease Mother   . Diabetes Mother   . Rheum arthritis Father   . Diabetes Father   . Breast cancer Paternal Aunt     dx over 95  . Diabetes Maternal Grandmother   . Diabetes Maternal Grandfather   . Uterine cancer Other 50  . Colon cancer Maternal Aunt     dx in her 63s-70s  . Breast cancer Cousin     paternal cousin dx in her 22s-70s    Additionally, it is important to note that Ms. Hankerson's parents were both cancer-free. Her mother had 10 siblings and her father had 9 siblings.  ASSESSMENT AND PLAN: [name] is a 68 y.o. 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 This history is not highly suggestive of a hereditary predisposition to cancer, especially in light of her already having negative genetic testing of multiple genes as noted above. We reviewed the characteristics, features and inheritance patterns of hereditary cancer syndromes to explain why she is at low risk.   [name] indicated that she came to clinic today because her daughter, who is a Marine scientist, wanted her to get tested with [facility]'s MyRisk (25 gene) panel. We discussed that there is no reason for this additional test and  that the relevant genes have already been tested via the panel she had. No additional genetic testing was not recommend at this time. We recommended Ms. Tout continue with the screenings recommended by her overseeing providers.   We encouraged [name] to remain in contact with Cancer Genetics annually so that we can update the family history and inform her of any changes in cancer genetics and testing that may be of benefit for this family. Ms.  Wyse questions were answered to her satisfaction today.   Thank you for the referral and allowing [place] to share in the care of your patient.   The patient was seen for a total of 30 minutes, greater than 50% of which was spent face-to-face counseling. This patient was discussed with the overseeing provider who agrees with the above.

## 2013-12-13 ENCOUNTER — Other Ambulatory Visit: Payer: Self-pay | Admitting: Oncology

## 2013-12-13 DIAGNOSIS — C50211 Malignant neoplasm of upper-inner quadrant of right female breast: Secondary | ICD-10-CM

## 2013-12-20 ENCOUNTER — Ambulatory Visit: Payer: Commercial Managed Care - HMO | Admitting: Nutrition

## 2013-12-20 ENCOUNTER — Other Ambulatory Visit: Payer: Self-pay | Admitting: Oncology

## 2013-12-20 ENCOUNTER — Other Ambulatory Visit (HOSPITAL_BASED_OUTPATIENT_CLINIC_OR_DEPARTMENT_OTHER): Payer: Commercial Managed Care - HMO

## 2013-12-20 ENCOUNTER — Telehealth: Payer: Self-pay | Admitting: Oncology

## 2013-12-20 ENCOUNTER — Ambulatory Visit (HOSPITAL_BASED_OUTPATIENT_CLINIC_OR_DEPARTMENT_OTHER): Payer: Commercial Managed Care - HMO | Admitting: Oncology

## 2013-12-20 ENCOUNTER — Ambulatory Visit (HOSPITAL_BASED_OUTPATIENT_CLINIC_OR_DEPARTMENT_OTHER): Payer: Commercial Managed Care - HMO

## 2013-12-20 ENCOUNTER — Encounter: Payer: Self-pay | Admitting: Oncology

## 2013-12-20 VITALS — BP 88/53 | HR 83

## 2013-12-20 VITALS — BP 99/50 | HR 66 | Temp 98.5°F | Resp 18 | Ht 66.0 in | Wt 152.7 lb

## 2013-12-20 DIAGNOSIS — Z5111 Encounter for antineoplastic chemotherapy: Secondary | ICD-10-CM

## 2013-12-20 DIAGNOSIS — C50919 Malignant neoplasm of unspecified site of unspecified female breast: Secondary | ICD-10-CM

## 2013-12-20 DIAGNOSIS — C50211 Malignant neoplasm of upper-inner quadrant of right female breast: Secondary | ICD-10-CM

## 2013-12-20 DIAGNOSIS — Z171 Estrogen receptor negative status [ER-]: Secondary | ICD-10-CM

## 2013-12-20 LAB — CBC WITH DIFFERENTIAL/PLATELET
BASO%: 0.7 % (ref 0.0–2.0)
Basophils Absolute: 0 10*3/uL (ref 0.0–0.1)
EOS ABS: 0 10*3/uL (ref 0.0–0.5)
EOS%: 0.1 % (ref 0.0–7.0)
HCT: 32.4 % — ABNORMAL LOW (ref 34.8–46.6)
HGB: 10.6 g/dL — ABNORMAL LOW (ref 11.6–15.9)
LYMPH%: 19.5 % (ref 14.0–49.7)
MCH: 32.5 pg (ref 25.1–34.0)
MCHC: 32.8 g/dL (ref 31.5–36.0)
MCV: 98.9 fL (ref 79.5–101.0)
MONO#: 0.6 10*3/uL (ref 0.1–0.9)
MONO%: 15.6 % — AB (ref 0.0–14.0)
NEUT#: 2.6 10*3/uL (ref 1.5–6.5)
NEUT%: 64.1 % (ref 38.4–76.8)
PLATELETS: 241 10*3/uL (ref 145–400)
RBC: 3.28 10*6/uL — ABNORMAL LOW (ref 3.70–5.45)
RDW: 17.7 % — ABNORMAL HIGH (ref 11.2–14.5)
WBC: 4 10*3/uL (ref 3.9–10.3)
lymph#: 0.8 10*3/uL — ABNORMAL LOW (ref 0.9–3.3)

## 2013-12-20 LAB — COMPREHENSIVE METABOLIC PANEL (CC13)
ALK PHOS: 55 U/L (ref 40–150)
ALT: 17 U/L (ref 0–55)
AST: 18 U/L (ref 5–34)
Albumin: 3.4 g/dL — ABNORMAL LOW (ref 3.5–5.0)
Anion Gap: 7 mEq/L (ref 3–11)
BILIRUBIN TOTAL: 0.26 mg/dL (ref 0.20–1.20)
BUN: 9.2 mg/dL (ref 7.0–26.0)
CO2: 27 mEq/L (ref 22–29)
CREATININE: 0.7 mg/dL (ref 0.6–1.1)
Calcium: 9.4 mg/dL (ref 8.4–10.4)
Chloride: 108 mEq/L (ref 98–109)
GLUCOSE: 70 mg/dL (ref 70–140)
Potassium: 4.6 mEq/L (ref 3.5–5.1)
SODIUM: 142 meq/L (ref 136–145)
TOTAL PROTEIN: 6.3 g/dL — AB (ref 6.4–8.3)

## 2013-12-20 MED ORDER — SODIUM CHLORIDE 0.9 % IV SOLN
Freq: Once | INTRAVENOUS | Status: AC
  Administered 2013-12-20: 13:00:00 via INTRAVENOUS

## 2013-12-20 MED ORDER — EPIRUBICIN HCL CHEMO IV INJECTION 200 MG/100ML
100.0000 mg/m2 | Freq: Once | INTRAVENOUS | Status: AC
  Administered 2013-12-20: 186 mg via INTRAVENOUS
  Filled 2013-12-20: qty 93

## 2013-12-20 MED ORDER — SODIUM CHLORIDE 0.9 % IJ SOLN
10.0000 mL | INTRAMUSCULAR | Status: DC | PRN
  Filled 2013-12-20: qty 10

## 2013-12-20 MED ORDER — HEPARIN SOD (PORK) LOCK FLUSH 100 UNIT/ML IV SOLN
500.0000 [IU] | Freq: Once | INTRAVENOUS | Status: DC | PRN
  Filled 2013-12-20: qty 5

## 2013-12-20 MED ORDER — SODIUM CHLORIDE 0.9 % IV SOLN
500.0000 mg/m2 | Freq: Once | INTRAVENOUS | Status: AC
  Administered 2013-12-20: 940 mg via INTRAVENOUS
  Filled 2013-12-20: qty 47

## 2013-12-20 MED ORDER — SODIUM CHLORIDE 0.9 % IV SOLN
Freq: Once | INTRAVENOUS | Status: AC
  Administered 2013-12-20: 11:00:00 via INTRAVENOUS

## 2013-12-20 MED ORDER — SODIUM CHLORIDE 0.9 % IV SOLN
150.0000 mg | Freq: Once | INTRAVENOUS | Status: AC
  Administered 2013-12-20: 150 mg via INTRAVENOUS
  Filled 2013-12-20: qty 5

## 2013-12-20 MED ORDER — PALONOSETRON HCL INJECTION 0.25 MG/5ML
0.2500 mg | Freq: Once | INTRAVENOUS | Status: AC
  Administered 2013-12-20: 0.25 mg via INTRAVENOUS

## 2013-12-20 MED ORDER — DEXAMETHASONE SODIUM PHOSPHATE 20 MG/5ML IJ SOLN
12.0000 mg | Freq: Once | INTRAMUSCULAR | Status: AC
  Administered 2013-12-20: 12 mg via INTRAVENOUS

## 2013-12-20 MED ORDER — FLUOROURACIL CHEMO INJECTION 2.5 GM/50ML
500.0000 mg/m2 | Freq: Once | INTRAVENOUS | Status: AC
  Administered 2013-12-20: 950 mg via INTRAVENOUS
  Filled 2013-12-20: qty 19

## 2013-12-20 MED ORDER — PALONOSETRON HCL INJECTION 0.25 MG/5ML
INTRAVENOUS | Status: AC
  Filled 2013-12-20: qty 5

## 2013-12-20 MED ORDER — SODIUM CHLORIDE 0.9 % IV SOLN
Freq: Once | INTRAVENOUS | Status: AC
  Administered 2013-12-20: 15:00:00 via INTRAVENOUS

## 2013-12-20 MED ORDER — DEXAMETHASONE SODIUM PHOSPHATE 20 MG/5ML IJ SOLN
INTRAMUSCULAR | Status: AC
  Filled 2013-12-20: qty 5

## 2013-12-20 NOTE — Progress Notes (Signed)
1755- At end of 2nd liter of fluid, pt BP not yet above 443 systolic. Pt asymptomatic. Noted blood pressure rarely runs above 154 systolic. Notified Selena Lesser, NP- per [place] and Dr. Ammie Dalton- ok to discharge patient home. Patient returning tomorrow for additional fluids. Pt able to check blood pressures at home and will call if becomes symptomatic.

## 2013-12-20 NOTE — Progress Notes (Signed)
1510- low BP values reported to Dr. Marko Plume. Order to run 544ml-1L NS at 300-56ml/hr, check BP after 1 hr of fluids, if SBP greater than 100 may discharge. If not, give remainder of volume of fluid and recheck BP.   1620- SBP less than 100, at 94. Per Terri RN, SBP 102 visibly, 94 audibly.  Will add 250 more to infusion and recheck BP.

## 2013-12-20 NOTE — Progress Notes (Signed)
Nutrition f/u completed with pt in chemo room.  Wt 152 lbs stable x 3 weeks.  Pt reports she is tolerating 2 Enu/day, bought many at [employer].  Pt reports good appetite, but  c/o changes in taste.  Pt states now sweet foods taste better, tolerating watermelon and other fruits.  Pt estimated with supplements, she is consuming approximately 1200-1500 kcal/day.   Nutrition Dx. Inadequate oral intake improved.  Intervention:  Encouraged continued po intake of tolerated foods and supplements to promote wt maintenance and healing.  Resident aware of ideas to cope with changes in taste.    Monitoring, evaluation, and goals:  Pt will continue to be able to maintain wt and is tolerate supplement.   Next visit: To be scheduled.

## 2013-12-20 NOTE — Telephone Encounter (Signed)
added appts for 7/23, 7/27 and 8/10. s/w pt she is aware. pt also needs fluids 7/21. message sent to inf schduler/manager re adding fluids and calling pt. pt has inj 9:30am 7/21 and is aware that she needs fluids and that we will call if the 9;30am time changes.

## 2013-12-20 NOTE — Progress Notes (Signed)
ID: [name] OB: 05-14-1946  MR#: [id number]  CSN#:[id number]  PCP: PROVIDER NOT IN SYSTEM GYN:   SU: Dr. Dalbert Batman OTHER MD:  CHIEF COMPLAINT:[place] woman with T1 N0, stage IA, invasive ductal carcinoma, grade 2, ER negative, PR negative, Ki-67 of 13%, HER-2/neu negative.   BREAST CANCER HISTORY: Patient went for an annual mammogram and she was found to have an abnormality in the right breast at the 3:00 position. Subsequently had a right diagnostic mammogram performed. She also had a image guided biopsy of the right breast at the 3:00 position. 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 The tumor was ER negative PR negative HER-2/neu negative with a proliferation marker Ki-67 13%. All of her workup initially was performed in [place]. She subsequently was referred to Dr. Fanny Skates who saw her on 08/04/2003. He has recommended MRI of the breasts. This was performed on 08/09/2013. In the right breast there was an irregular enhancing mass in the 3:00 region measuring 1.5 x 1.0 x 1.4 cm. Also noted was nodular segmental enhancement extending 6.1 cm posteriorly from the mass towards the chest wall. The mass and nodular enhancement measured 7 cm. In the middle third of the lower outer quadrant of the right breast there was a 1.0 x 0.7 x 0.7 cm area of focal non-masslike enhancement. Left breast revealed no masses or abnormal enhancements. Lymph nodes appeared normal.  CURRENT THERAPY: Fluorouracil, Epirubicin, Cyclophosphamide cycle 4 day 8  INTERVAL HISTORY:  Mrs. Ventrella is here today for evaluation prior to her 5th cycle of FEC chemotherapy.  It is given on day 1 of a 21 day cycle with Neulasta given on day 2 for granulocyte support.    She is having mild fatigue.  She denies any current fevers, chills, nausea, vomiting, constipation, diarrhea, numbness, or any further concerns. She is able to take the Enu supplementation. Her weight is stable.   REVIEW  OF SYSTEMS: A 10 point review of systems was conducted and is otherwise negative except for what is noted above.    PAST MEDICAL HISTORY: Past Medical History  Diagnosis Date  . Family history of anesthesia complication     "daughter PONV; long time in recovery" (09/07/2013)  . Asthma     as a child  . Asthmatic bronchitis     "about q yr" (09/07/2013)  . Pneumonia     "several times; usually follows asthmatic bronchitis" (09/07/2013)  . Sinus headache     "maybe 2-3 times/month"  . Breast cancer     "right breast"     PAST SURGICAL HISTORY: Past Surgical History  Procedure Laterality Date  . Tubal ligation  1977  . Finger fracture surgery Left 2010    "middle finger spiral fx; little finger broken; tissue damage"  . Tonsillectomy    . Portacath placement Right 09/07/2013  . Mastectomy complete / simple w/ sentinel node biopsy Right 09/07/2013  . Mastectomy complete / simple  09/07/2013  . Breast biopsy Right 07/2013  . Dilation and curettage of uterus      S/P miscarriage  . Wrist fracture surgery Left 2010  . Mastectomy w/ sentinel node biopsy Bilateral 09/07/2013    Procedure: BILATERAL MASTECTOMY WITH RIGHT SENTINEL LYMPH NODE BIOPSY;  Surgeon: [name], MD;  Location: [facility];  Service: General;  Laterality: Bilateral;  . Portacath placement Right 09/07/2013    Procedure: INSERTION PORT-A-CATH;  Surgeon: [name], MD;  Location: [facility];  Service: General;  Laterality:  Right;    FAMILY HISTORY Family History  Problem Relation Age of Onset  . Bladder Cancer Sister 51    worked in [employer], around 2nd hand smoke all her life  . Diabetes Sister   . Colon cancer Brother 81    non-smoker  . Diabetes Brother   . Colon cancer Maternal Uncle 2  . Osteoporosis Mother   . Parkinson's disease Mother   . Diabetes Mother   . Rheum arthritis Father   . Diabetes Father   . Breast cancer Paternal Aunt     dx over 75  . Diabetes Maternal Grandmother   . Diabetes Maternal  Grandfather   . Uterine cancer Other 50  . Colon cancer Maternal Aunt     dx in her 66s-70s  . Breast cancer Cousin     paternal cousin dx in her 67s-70s    GYNECOLOGIC HISTORY: menarche at age 62, G97, P2, patient took HRT for 4-5 years post menopause without problems, no h/o abnormal pap smears or sexually transmitted infections.    SOCIAL HISTORY:  Lives with husband of 95 years.  Patient is retired.  No tobacco, ETOH, illicit drug use.    ADVANCED DIRECTIVES:  In place, HCPOA husband [name] who can be reached at [phone].     HEALTH MAINTENANCE: History  Substance Use Topics  . Smoking status: Never Smoker   . Smokeless tobacco: Never Used  . Alcohol Use: No      Mammogram: 07/2013 Colonoscopy: 2013 Bone Density Scan: 08/2013 Pap Smear: 2013 Eye Exam: 2012 Vitamin D Level: unknown Lipid Panel: patient unsure likely 3-4 years ago   No Known Allergies  Current Outpatient Prescriptions  Medication Sig Dispense Refill  . Alum & Mag Hydroxide-Simeth (MAGIC MOUTHWASH) SOLN SWISH AND spit ONE TEASPOONFUL BY MOUTH FOUR TIMES DAILY AS NEEDED.  240 mL  1  . b complex vitamins tablet Take 1 tablet by mouth daily.      . Calcium Carbonate-Vitamin D (CALCIUM PLUS VITAMIN D PO) Take 1 tablet by mouth 2 (two) times daily.      Marland Kitchen CHERATUSSIN AC 100-10 MG/5ML syrup       . dexamethasone (DECADRON) 4 MG tablet Take 2 tablets by mouth once a day on the day after chemotherapy and then take 2 tablets two times a day for 2 days. Take with food.  30 tablet  1  . lidocaine (XYLOCAINE) 2 % solution Use as directed 5 mLs in the mouth or throat 4 (four) times daily as needed for mouth pain (use with current supply of MMW).  120 mL  1  . lidocaine-prilocaine (EMLA) cream Apply 1 application topically as needed.  30 g  1  . LORazepam (ATIVAN) 0.5 MG tablet TAKE ONE TABLET EVERY 6 HOURS AS NEEDED FOR NAUSEA AND VOMITING  30 tablet  0  . omeprazole (PRILOSEC) 40 MG capsule Take 1 capsule (40 mg  total) by mouth daily.  30 capsule  5  . ondansetron (ZOFRAN) 8 MG tablet TAKE ONE TABLET TWICE DAILY AS NEEDED *START ON THE 3RD DAY AFTER CHEMOTHERAPY*  30 tablet  1  . prochlorperazine (COMPAZINE) 10 MG tablet TAKE ONE TABLET EVERY 6 HOURS AS NEEDED FOR NAUSEA AND VOMITING  30 tablet  1  . UNABLE TO FIND [id number] Post mastectomy camisole QTY: 2  Diagnosis Code 174.9  2 each  3   No current facility-administered medications for this visit.   Facility-Administered Medications Ordered in Other Visits  Medication Dose Route Frequency  Provider Last Rate Last Dose  . heparin lock flush 100 unit/mL  500 Units Intracatheter Once PRN Chauncey Cruel, MD      . sodium chloride 0.9 % injection 10 mL  10 mL Intracatheter PRN Chauncey Cruel, MD        OBJECTIVE: Filed Vitals:   12/20/13 1021  BP: 99/50  Pulse: 66  Temp: 98.5 F (36.9 C)  Resp: 18     Body mass index is 24.66 kg/(m^2).     GENERAL: Patient is well appearing and in no acute distress.  HEENT:  Sclerae anicteric.  Oropharynx clear and moist. No ulcerations or evidence of oropharyngeal candidiasis. Neck is supple.  NODES:  No cervical, supraclavicular, or axillary lymphadenopathy palpated.  BREAST EXAM:  Deferred. LUNGS:  Clear to auscultation bilaterally.  No wheezes or rhonchi. HEART:  Regular rate and rhythm. No murmur appreciated. ABDOMEN:  Soft, nontender.  Positive, normoactive bowel sounds. No organomegaly palpated. MSK:  No focal spinal tenderness to palpation. Full range of motion bilaterally in the upper extremities. EXTREMITIES:  No peripheral edema.   SKIN:  Clear with no obvious rashes or skin changes. No nail dyscrasia. NEURO:  Nonfocal. Well oriented.  Appropriate affect. ECOG FS:1  LAB RESULTS:  CMP     Component Value Date/Time   NA 142 12/20/2013 1006   NA 141 08/31/2013 1038   K 4.6 12/20/2013 1006   K 4.7 08/31/2013 1038   CL 101 08/31/2013 1038   CO2 27 12/20/2013 1006   CO2 27 08/31/2013 1038    GLUCOSE 70 12/20/2013 1006   GLUCOSE 99 08/31/2013 1038   BUN 9.2 12/20/2013 1006   BUN 18 08/31/2013 1038   CREATININE 0.7 12/20/2013 1006   CREATININE 0.75 09/07/2013 1652   CALCIUM 9.4 12/20/2013 1006   CALCIUM 9.8 08/31/2013 1038   PROT 6.3* 12/20/2013 1006   PROT 7.4 08/31/2013 1038   ALBUMIN 3.4* 12/20/2013 1006   ALBUMIN 4.0 08/31/2013 1038   AST 18 12/20/2013 1006   AST 22 08/31/2013 1038   ALT 17 12/20/2013 1006   ALT 17 08/31/2013 1038   ALKPHOS 55 12/20/2013 1006   ALKPHOS 70 08/31/2013 1038   BILITOT 0.26 12/20/2013 1006   BILITOT 0.3 08/31/2013 1038   GFRNONAA 84* 09/07/2013 1652   GFRAA >90 09/07/2013 1652    I No results found for this basename: SPEP,  UPEP,   kappa and lambda light chains    Lab Results  Component Value Date   WBC 4.0 12/20/2013   NEUTROABS 2.6 12/20/2013   HGB 10.6* 12/20/2013   HCT 32.4* 12/20/2013   MCV 98.9 12/20/2013   PLT 241 12/20/2013      Chemistry      Component Value Date/Time   NA 142 12/20/2013 1006   NA 141 08/31/2013 1038   K 4.6 12/20/2013 1006   K 4.7 08/31/2013 1038   CL 101 08/31/2013 1038   CO2 27 12/20/2013 1006   CO2 27 08/31/2013 1038   BUN 9.2 12/20/2013 1006   BUN 18 08/31/2013 1038   CREATININE 0.7 12/20/2013 1006   CREATININE 0.75 09/07/2013 1652      Component Value Date/Time   CALCIUM 9.4 12/20/2013 1006   CALCIUM 9.8 08/31/2013 1038   ALKPHOS 55 12/20/2013 1006   ALKPHOS 70 08/31/2013 1038   AST 18 12/20/2013 1006   AST 22 08/31/2013 1038   ALT 17 12/20/2013 1006   ALT 17 08/31/2013 1038   BILITOT 0.26 12/20/2013 1006  BILITOT 0.3 08/31/2013 1038       No results found for this basename: LABCA2    No components found with this basename: [id number]    No results found for this basename: INR,  in the last 168 hours  Urinalysis    Component Value Date/Time   COLORURINE YELLOW 08/31/2013 [place] 08/31/2013 1018   LABSPEC 1.020 11/16/2013 1159   LABSPEC 1.007 08/31/2013 1018   PHURINE 6.5 08/31/2013 1018   GLUCOSEU  500 11/16/2013 1159   GLUCOSEU NEGATIVE 08/31/2013 1018   HGBUR NEGATIVE 08/31/2013 1018   BILIRUBINUR NEGATIVE 08/31/2013 1018   KETONESUR NEGATIVE 08/31/2013 1018   PROTEINUR NEGATIVE 08/31/2013 1018   UROBILINOGEN 0.2 11/16/2013 1159   UROBILINOGEN 0.2 08/31/2013 1018   NITRITE NEGATIVE 08/31/2013 1018   LEUKOCYTESUR NEGATIVE 08/31/2013 1018    STUDIES: No results found.  ASSESSMENT: 68 y.o. [place] woman with T1 N0, stage IA, invasive ductal carcinoma, grade 2, ER negative, PR negative, Ki-67 of 13%, HER-2/neu negative.    1. The patient underwent bilateral mastectomy by Dr. Dalbert Batman on 09/07/2013, and a right port-a-cath was also placed at that time. Pathology revealed a grade 2, 1.7 cm invasive ductal carcinoma. Two lymph nodes were negative for malignancy. The tumor was ER negative, PR negative, HER-2/neu negative. Ki-67 was 13%.   2. Adjuvant Fluorouracil, Epirubicin, and Cyclophosphamide given on day 1 of a 21 day cycle with Neulasta given as granulocyte support was started on 09/27/13. A total of 6 cycles are planned.  3. As outlined by the 08/10/13 note from Dr Humphrey Rolls, the patient will then receive Carboplatin and Taxol weekly for 12 weeks.   PLAN:   Mrs. Labrake is doing well today.  I have recommended that she proceed with cycle 5 of FEC today. She will return tomorrow for a Neulasta injection. Since she has difficulty eating and drinking following her chemotherapy, I have scheduled 1 Liter of NS to be given on 7/21, 7/23, and 7/27.     She will be continued to be followed by Dory Peru while on treatment.   We will plan to see her back in 1 week to assess for chemotoxicities.   She knows to call us in the interim for any questions or concerns.  We can certainly see her sooner if needed.  I spent 15 minutes counseling the patient face to face.  The total time spent in the appointment was 20 minutes.  Mikey Bussing, DNP, AGPCNP-BC  12/20/2013 3:46 PM

## 2013-12-20 NOTE — Patient Instructions (Signed)
[facility] Discharge Instructions for Patients Receiving Chemotherapy  Today you received the following chemotherapy agents: Epirubicin, 5FU (Adrucil), Cytoxan  To help prevent nausea and vomiting after your treatment, we encourage you to take your nausea medication as prescribed by your physician.    If you develop nausea and vomiting that is not controlled by your nausea medication, call the clinic.   BELOW ARE SYMPTOMS THAT SHOULD BE REPORTED IMMEDIATELY:  *FEVER GREATER THAN 100.5 F  *CHILLS WITH OR WITHOUT FEVER  NAUSEA AND VOMITING THAT IS NOT CONTROLLED WITH YOUR NAUSEA MEDICATION  *UNUSUAL SHORTNESS OF BREATH  *UNUSUAL BRUISING OR BLEEDING  TENDERNESS IN MOUTH AND THROAT WITH OR WITHOUT PRESENCE OF ULCERS  *URINARY PROBLEMS  *BOWEL PROBLEMS  UNUSUAL RASH Items with * indicate a potential emergency and should be followed up as soon as possible.  Feel free to call the clinic you have any questions or concerns. The clinic phone number is [phone].

## 2013-12-21 ENCOUNTER — Telehealth: Payer: Self-pay | Admitting: *Deleted

## 2013-12-21 ENCOUNTER — Ambulatory Visit (HOSPITAL_BASED_OUTPATIENT_CLINIC_OR_DEPARTMENT_OTHER): Payer: Commercial Managed Care - HMO

## 2013-12-21 ENCOUNTER — Telehealth: Payer: Self-pay | Admitting: Adult Health

## 2013-12-21 VITALS — BP 116/49 | HR 81 | Temp 98.2°F

## 2013-12-21 DIAGNOSIS — D709 Neutropenia, unspecified: Secondary | ICD-10-CM

## 2013-12-21 DIAGNOSIS — C50211 Malignant neoplasm of upper-inner quadrant of right female breast: Secondary | ICD-10-CM

## 2013-12-21 MED ORDER — PEGFILGRASTIM INJECTION 6 MG/0.6ML
6.0000 mg | Freq: Once | SUBCUTANEOUS | Status: AC
  Administered 2013-12-21: 6 mg via SUBCUTANEOUS
  Filled 2013-12-21: qty 0.6

## 2013-12-21 NOTE — Telephone Encounter (Signed)
Per [facility] staff message scheduled appts. Advised scheduler 

## 2013-12-21 NOTE — Telephone Encounter (Signed)
s.w. [place] to add ivf °

## 2013-12-21 NOTE — Telephone Encounter (Signed)
Per [facility] staff phone call scheduled appts. Advised schedulersPer [facility]OF staff phone call scheduled appts. Advised schedulers

## 2013-12-22 ENCOUNTER — Ambulatory Visit (HOSPITAL_BASED_OUTPATIENT_CLINIC_OR_DEPARTMENT_OTHER): Payer: Commercial Managed Care - HMO

## 2013-12-22 ENCOUNTER — Ambulatory Visit: Payer: Commercial Managed Care - HMO | Admitting: Nutrition

## 2013-12-22 VITALS — BP 114/65 | HR 80 | Temp 98.0°F

## 2013-12-22 DIAGNOSIS — C50211 Malignant neoplasm of upper-inner quadrant of right female breast: Secondary | ICD-10-CM

## 2013-12-22 DIAGNOSIS — C50919 Malignant neoplasm of unspecified site of unspecified female breast: Secondary | ICD-10-CM

## 2013-12-22 DIAGNOSIS — E860 Dehydration: Secondary | ICD-10-CM

## 2013-12-22 MED ORDER — SODIUM CHLORIDE 0.9 % IV SOLN
Freq: Once | INTRAVENOUS | Status: AC
  Administered 2013-12-22: 09:00:00 via INTRAVENOUS

## 2013-12-22 NOTE — Patient Instructions (Signed)
Dehydration, Adult Dehydration is when you lose more fluids from the body than you take in. Vital organs like the kidneys, brain, and heart cannot function without a proper amount of fluids and salt. Any loss of fluids from the body can cause dehydration.  CAUSES   Vomiting.  Diarrhea.  Excessive sweating.  Excessive urine output.  Fever. SYMPTOMS  Mild dehydration  Thirst.  Dry lips.  Slightly dry mouth. Moderate dehydration  Very dry mouth.  Sunken eyes.  Skin does not bounce back quickly when lightly pinched and released.  Dark urine and decreased urine production.  Decreased tear production.  Headache. Severe dehydration  Very dry mouth.  Extreme thirst.  Rapid, weak pulse (more than 100 beats per minute at rest).  Cold hands and feet.  Not able to sweat in spite of heat and temperature.  Rapid breathing.  Blue lips.  Confusion and lethargy.  Difficulty being awakened.  Minimal urine production.  No tears. DIAGNOSIS  Your caregiver will diagnose dehydration based on your symptoms and your exam. Blood and urine tests will help confirm the diagnosis. The diagnostic evaluation should also identify the cause of dehydration. TREATMENT  Treatment of mild or moderate dehydration can often be done at home by increasing the amount of fluids that you drink. It is best to drink small amounts of fluid more often. Drinking too much at one time can make vomiting worse. Refer to the home care instructions below. Severe dehydration needs to be treated at the hospital where you will probably be given intravenous (IV) fluids that contain water and electrolytes. HOME CARE INSTRUCTIONS   Ask your caregiver about specific rehydration instructions.  Drink enough fluids to keep your urine clear or pale yellow.  Drink small amounts frequently if you have nausea and vomiting.  Eat as you normally do.  Avoid:  Foods or drinks high in sugar.  Carbonated  drinks.  Juice.  Extremely hot or cold fluids.  Drinks with caffeine.  Fatty, greasy foods.  Alcohol.  Tobacco.  Overeating.  Gelatin desserts.  Wash your hands well to avoid spreading bacteria and viruses.  Only take over-the-counter or prescription medicines for pain, discomfort, or fever as directed by your caregiver.  Ask your caregiver if you should continue all prescribed and over-the-counter medicines.  Keep all follow-up appointments with your caregiver. SEEK MEDICAL CARE IF:  You have abdominal pain and it increases or stays in one area (localizes).  You have a rash, stiff neck, or severe headache.  You are irritable, sleepy, or difficult to awaken.  You are weak, dizzy, or extremely thirsty. SEEK IMMEDIATE MEDICAL CARE IF:   You are unable to keep fluids down or you get worse despite treatment.  You have frequent episodes of vomiting or diarrhea.  You have blood or green matter (bile) in your vomit.  You have blood in your stool or your stool looks black and tarry.  You have not urinated in 6 to 8 hours, or you have only urinated a small amount of very dark urine.  You have a fever.  You faint. MAKE SURE YOU:   Understand these instructions.  Will watch your condition.  Will get help right away if you are not doing well or get worse. Document Released: 05/20/2005 Document Revised: 08/12/2011 Document Reviewed: 01/07/2011 ExitCare Patient Information 2015 [employer]. This information is not intended to replace advice given to you by your health care provider. Make sure you discuss any questions you have with your health care   provider.  

## 2013-12-22 NOTE — Progress Notes (Signed)
Nutrition followup completed with patient in the chemotherapy area.  Weight continues to be stable and was documented as 152 pounds.  Patient is drinking oral nutrition supplements twice a day.  Taste alterations continue.  Nutrition diagnosis: Inadequate oral intake improved.   Intervention: Provided support and encouragement for patient to continue increased oral intake for weight maintenance.   Questions were answered and teach back method used.  Monitoring, evaluation, goals: Patient will continue weight maintenance with adequate oral intake.  Next visit: Monday, August 10, during chemotherapy.   **Disclaimer: This note was dictated with voice recognition software. Similar sounding words can inadvertently be transcribed and this note may contain transcription errors which may not have been corrected upon publication of note.**

## 2013-12-23 ENCOUNTER — Other Ambulatory Visit: Payer: Self-pay | Admitting: *Deleted

## 2013-12-23 ENCOUNTER — Encounter: Payer: Commercial Managed Care - HMO | Admitting: Nutrition

## 2013-12-23 ENCOUNTER — Ambulatory Visit (HOSPITAL_BASED_OUTPATIENT_CLINIC_OR_DEPARTMENT_OTHER): Payer: Commercial Managed Care - HMO

## 2013-12-23 VITALS — BP 99/51 | HR 76 | Temp 98.5°F | Resp 20

## 2013-12-23 DIAGNOSIS — E860 Dehydration: Secondary | ICD-10-CM

## 2013-12-23 DIAGNOSIS — C50211 Malignant neoplasm of upper-inner quadrant of right female breast: Secondary | ICD-10-CM

## 2013-12-23 DIAGNOSIS — C50919 Malignant neoplasm of unspecified site of unspecified female breast: Secondary | ICD-10-CM

## 2013-12-23 MED ORDER — HEPARIN SOD (PORK) LOCK FLUSH 100 UNIT/ML IV SOLN
500.0000 [IU] | Freq: Once | INTRAVENOUS | Status: AC
  Administered 2013-12-23: 500 [IU] via INTRAVENOUS
  Filled 2013-12-23: qty 5

## 2013-12-23 MED ORDER — SODIUM CHLORIDE 0.9 % IJ SOLN
10.0000 mL | INTRAMUSCULAR | Status: DC | PRN
  Administered 2013-12-23: 10 mL via INTRAVENOUS
  Filled 2013-12-23: qty 10

## 2013-12-23 MED ORDER — SODIUM CHLORIDE 0.9 % IV SOLN
Freq: Once | INTRAVENOUS | Status: AC
  Administered 2013-12-23: 15:00:00 via INTRAVENOUS

## 2013-12-23 NOTE — Patient Instructions (Signed)

## 2013-12-27 ENCOUNTER — Other Ambulatory Visit: Payer: Self-pay | Admitting: *Deleted

## 2013-12-27 ENCOUNTER — Encounter: Payer: Self-pay | Admitting: Adult Health

## 2013-12-27 ENCOUNTER — Other Ambulatory Visit (HOSPITAL_BASED_OUTPATIENT_CLINIC_OR_DEPARTMENT_OTHER): Payer: Commercial Managed Care - HMO

## 2013-12-27 ENCOUNTER — Other Ambulatory Visit: Payer: Self-pay | Admitting: Adult Health

## 2013-12-27 ENCOUNTER — Ambulatory Visit (HOSPITAL_BASED_OUTPATIENT_CLINIC_OR_DEPARTMENT_OTHER): Payer: Commercial Managed Care - HMO

## 2013-12-27 ENCOUNTER — Ambulatory Visit (HOSPITAL_BASED_OUTPATIENT_CLINIC_OR_DEPARTMENT_OTHER): Payer: Commercial Managed Care - HMO | Admitting: Adult Health

## 2013-12-27 VITALS — BP 88/53 | HR 81 | Temp 97.5°F | Resp 18 | Ht 66.0 in | Wt 150.0 lb

## 2013-12-27 VITALS — BP 100/45 | HR 82

## 2013-12-27 DIAGNOSIS — C50211 Malignant neoplasm of upper-inner quadrant of right female breast: Secondary | ICD-10-CM

## 2013-12-27 DIAGNOSIS — R300 Dysuria: Secondary | ICD-10-CM

## 2013-12-27 DIAGNOSIS — D702 Other drug-induced agranulocytosis: Secondary | ICD-10-CM

## 2013-12-27 DIAGNOSIS — R5381 Other malaise: Secondary | ICD-10-CM

## 2013-12-27 DIAGNOSIS — F329 Major depressive disorder, single episode, unspecified: Secondary | ICD-10-CM

## 2013-12-27 DIAGNOSIS — D696 Thrombocytopenia, unspecified: Secondary | ICD-10-CM

## 2013-12-27 DIAGNOSIS — C50919 Malignant neoplasm of unspecified site of unspecified female breast: Secondary | ICD-10-CM

## 2013-12-27 DIAGNOSIS — R031 Nonspecific low blood-pressure reading: Secondary | ICD-10-CM

## 2013-12-27 DIAGNOSIS — Z171 Estrogen receptor negative status [ER-]: Secondary | ICD-10-CM

## 2013-12-27 DIAGNOSIS — D709 Neutropenia, unspecified: Secondary | ICD-10-CM

## 2013-12-27 DIAGNOSIS — R5383 Other fatigue: Secondary | ICD-10-CM

## 2013-12-27 DIAGNOSIS — F32A Depression, unspecified: Secondary | ICD-10-CM

## 2013-12-27 LAB — COMPREHENSIVE METABOLIC PANEL (CC13)
ALT: 17 U/L (ref 0–55)
AST: 12 U/L (ref 5–34)
Albumin: 3.5 g/dL (ref 3.5–5.0)
Alkaline Phosphatase: 66 U/L (ref 40–150)
Anion Gap: 6 mEq/L (ref 3–11)
BUN: 20.6 mg/dL (ref 7.0–26.0)
CALCIUM: 9.3 mg/dL (ref 8.4–10.4)
CHLORIDE: 101 meq/L (ref 98–109)
CO2: 29 mEq/L (ref 22–29)
Creatinine: 0.8 mg/dL (ref 0.6–1.1)
Glucose: 125 mg/dl (ref 70–140)
POTASSIUM: 4.1 meq/L (ref 3.5–5.1)
Sodium: 136 mEq/L (ref 136–145)
Total Bilirubin: 0.49 mg/dL (ref 0.20–1.20)
Total Protein: 6.3 g/dL — ABNORMAL LOW (ref 6.4–8.3)

## 2013-12-27 LAB — URINALYSIS, MICROSCOPIC - CHCC
Bilirubin (Urine): NEGATIVE
Blood: NEGATIVE
Glucose: NEGATIVE mg/dL
Ketones: NEGATIVE mg/dL
LEUKOCYTE ESTERASE: NEGATIVE
Nitrite: NEGATIVE
PH: 6 (ref 4.6–8.0)
Protein: NEGATIVE mg/dL
RBC / HPF: NEGATIVE (ref 0–2)
Specific Gravity, Urine: 1.01 (ref 1.003–1.035)
Urobilinogen, UR: 0.2 mg/dL (ref 0.2–1)

## 2013-12-27 LAB — CBC WITH DIFFERENTIAL/PLATELET
BASO%: 1.7 % (ref 0.0–2.0)
Basophils Absolute: 0 10*3/uL (ref 0.0–0.1)
EOS%: 1.7 % (ref 0.0–7.0)
Eosinophils Absolute: 0 10*3/uL (ref 0.0–0.5)
HCT: 31.3 % — ABNORMAL LOW (ref 34.8–46.6)
HGB: 10.6 g/dL — ABNORMAL LOW (ref 11.6–15.9)
LYMPH#: 0.4 10*3/uL — AB (ref 0.9–3.3)
LYMPH%: 66.1 % — AB (ref 14.0–49.7)
MCH: 32.7 pg (ref 25.1–34.0)
MCHC: 33.9 g/dL (ref 31.5–36.0)
MCV: 96.6 fL (ref 79.5–101.0)
MONO#: 0 10*3/uL — AB (ref 0.1–0.9)
MONO%: 1.7 % (ref 0.0–14.0)
NEUT#: 0.2 10*3/uL — CL (ref 1.5–6.5)
NEUT%: 28.8 % — ABNORMAL LOW (ref 38.4–76.8)
Platelets: 49 10*3/uL — ABNORMAL LOW (ref 145–400)
RBC: 3.24 10*6/uL — AB (ref 3.70–5.45)
RDW: 14.9 % — ABNORMAL HIGH (ref 11.2–14.5)
WBC: 0.6 10*3/uL — AB (ref 3.9–10.3)
nRBC: 0 % (ref 0–0)

## 2013-12-27 MED ORDER — SODIUM CHLORIDE 0.9 % IJ SOLN
10.0000 mL | INTRAMUSCULAR | Status: DC | PRN
  Administered 2013-12-27: 10 mL via INTRAVENOUS
  Filled 2013-12-27: qty 10

## 2013-12-27 MED ORDER — CIPROFLOXACIN HCL 500 MG PO TABS: 500.0000 mg | ORAL_TABLET | Freq: Two times a day (BID) | ORAL | Status: DC

## 2013-12-27 MED ORDER — HEPARIN SOD (PORK) LOCK FLUSH 100 UNIT/ML IV SOLN
500.0000 [IU] | Freq: Once | INTRAVENOUS | Status: AC
  Administered 2013-12-27: 500 [IU] via INTRAVENOUS
  Filled 2013-12-27: qty 5

## 2013-12-27 MED ORDER — SODIUM CHLORIDE 0.9 % IV SOLN
Freq: Once | INTRAVENOUS | Status: AC
  Administered 2013-12-27: 16:00:00 via INTRAVENOUS

## 2013-12-27 MED ORDER — VENLAFAXINE HCL 37.5 MG PO TABS: 37.5000 mg | ORAL_TABLET | Freq: Two times a day (BID) | ORAL | Status: DC

## 2013-12-27 NOTE — Patient Instructions (Signed)
Patient Neutropenia Instruction Sheet  Diagnosis: Breast Cancer      Treating Physician: Dr. Jana Hakim  Treatment: 1. Type of chemotherapy: FEC 2. Date of last treatment: 12/20/13  Last Blood Counts: Lab Results  Component Value Date   WBC 0.6* 12/27/2013   HGB 10.6* 12/27/2013   HCT 31.3* 12/27/2013   MCV 96.6 12/27/2013   PLT 49* 12/27/2013   ANC 200     Prophylactic Antibiotics: Cipro 500 mg by mouth twice a day Instructions: 1. Monitor temperature and call if fever  greater than 100.5, chills, shaking chills (rigors) 2. Call Physician on-call at [phone] 3. Give him/her symptoms and list of medications that you are taking and your last blood count.  Venlafaxine tablets What is this medicine? VENLAFAXINE (VEN la fax een) is used to treat depression, anxiety and panic disorder. This medicine may be used for other purposes; ask your health care provider or pharmacist if you have questions. COMMON BRAND NAME(S): Effexor What should I tell my health care provider before I take this medicine? They need to know if you have any of these conditions: -bleeding disorders -glaucoma -heart disease -high blood pressure -high cholesterol -kidney disease -liver disease -low levels of sodium in the blood -mania or bipolar disorder -seizures -suicidal thoughts, plans, or attempt; a previous suicide attempt by you or a family -take medicines that treat or prevent blood clots -thyroid disease -an unusual or allergic reaction to venlafaxine, desvenlafaxine, other medicines, foods, dyes, or preservatives -pregnant or trying to get pregnant -breast-feeding How should I use this medicine? Take this medicine by mouth with a glass of water. Follow the directions on the prescription label. Take it with food. Take your medicine at regular intervals. Do not take your medicine more often than directed. Do not stop taking this medicine suddenly except upon the advice of your doctor. Stopping  this medicine too quickly may cause serious side effects or your condition may worsen. A special MedGuide will be given to you by the pharmacist with each prescription and refill. Be sure to read this information carefully each time. Talk to your pediatrician regarding the use of this medicine in children. Special care may be needed. Overdosage: If you think you have taken too much of this medicine contact a poison control center or emergency room at once. NOTE: This medicine is only for you. Do not share this medicine with others. What if I miss a dose? If you miss a dose, take it as soon as you can. If it is almost time for your next dose, take only that dose. Do not take double or extra doses. What may interact with this medicine? Do not take this medicine with any of the following medications: -certain medicines for fungal infections like fluconazole, itraconazole, ketoconazole, posaconazole, voriconazole -cisapride -desvenlafaxine -dofetilide -dronedarone -duloxetine -levomilnacipran -linezolid -MAOIs like Carbex, Eldepryl, Marplan, Nardil, and Parnate -methylene blue (injected into a vein) -milnacipran -pimozide -thioridazine -ziprasidone This medicine may also interact with the following medications: -aspirin and aspirin-like medicines -certain medicines for depression, anxiety, or psychotic disturbances -certain medicines for migraine headaches like almotriptan, eletriptan, frovatriptan, naratriptan, rizatriptan, sumatriptan, zolmitriptan -certain medicines for sleep -certain medicines that treat or prevent blood clots like dalteparin, enoxaparin, warfarin -cimetidine -clozapine -diuretics -fentanyl -furazolidone -indinavir -isoniazid -lithium -metoprolol -NSAIDS, medicines for pain and inflammation, like ibuprofen or naproxen -other medicines that prolong the QT interval (cause an abnormal heart rhythm) -procarbazine -rasagiline -supplements like St. John's wort,  kava kava, valerian -tramadol -tryptophan This list may not  describe all possible interactions. Give your health care provider a list of all the medicines, herbs, non-prescription drugs, or dietary supplements you use. Also tell them if you smoke, drink alcohol, or use illegal drugs. Some items may interact with your medicine. What should I watch for while using this medicine? Tell your doctor if your symptoms do not get better or if they get worse. Visit your doctor or health care professional for regular checks on your progress. Because it may take several weeks to see the full effects of this medicine, it is important to continue your treatment as prescribed by your doctor. Patients and their families should watch out for new or worsening thoughts of suicide or depression. Also watch out for sudden changes in feelings such as feeling anxious, agitated, panicky, irritable, hostile, aggressive, impulsive, severely restless, overly excited and hyperactive, or not being able to sleep. If this happens, especially at the beginning of treatment or after a change in dose, call your health care professional. This medicine can cause an increase in blood pressure. Check with your doctor for instructions on monitoring your blood pressure while taking this medicine. You may get drowsy or dizzy. Do not drive, use machinery, or do anything that needs mental alertness until you know how this medicine affects you. Do not stand or sit up quickly, especially if you are an older patient. This reduces the risk of dizzy or fainting spells. Alcohol may interfere with the effect of this medicine. Avoid alcoholic drinks. Your mouth may get dry. Chewing sugarless gum, sucking hard candy and drinking plenty of water will help. Contact your doctor if the problem does not go away or is severe. What side effects may I notice from receiving this medicine? Side effects that you should report to your doctor or health care professional  as soon as possible: -allergic reactions like skin rash, itching or hives, swelling of the face, lips, or tongue -breathing problems -changes in vision -seizures -suicidal thoughts or other mood changes -trouble passing urine or change in the amount of urine -unusual bleeding or bruising Side effects that usually do not require medical attention (report to your doctor or health care professional if they continue or are bothersome): -change in sex drive or performance -constipation -increased sweating -loss of appetite -nausea -tremors -weight loss This list may not describe all possible side effects. Call your doctor for medical advice about side effects. You may report side effects to FDA at 1-800-FDA-1088. Where should I keep my medicine? Keep out of the reach of children. Store at a controlled temperature between 20 and 25 degrees C (68 and 77 degrees F), in a dry place. Throw away any unused medicine after the expiration date. NOTE: This sheet is a summary. It may not cover all possible information. If you have questions about this medicine, talk to your doctor, pharmacist, or health care provider.  2015, [employer]. (2012-12-15 12:43:55) Ciprofloxacin tablets What is this medicine? CIPROFLOXACIN (sip roe FLOX a sin) is a quinolone antibiotic. It is used to treat certain kinds of bacterial infections. It will not work for colds, flu, or other viral infections. This medicine may be used for other purposes; ask your health care provider or pharmacist if you have questions. COMMON BRAND NAME(S): Cipro What should I tell my health care provider before I take this medicine? They need to know if you have any of these conditions: -bone problems -cerebral disease -joint problems -irregular heartbeat -kidney disease -liver disease -myasthenia gravis -seizure disorder -tendon  problems -an unusual or allergic reaction to ciprofloxacin, other antibiotics or medicines,  foods, dyes, or preservatives -pregnant or trying to get pregnant -breast-feeding How should I use this medicine? Take this medicine by mouth with a glass of water. Follow the directions on the prescription label. Take your medicine at regular intervals. Do not take your medicine more often than directed. Take all of your medicine as directed even if you think your are better. Do not skip doses or stop your medicine early. You can take this medicine with food or on an empty stomach. It can be taken with a meal that contains dairy or calcium, but do not take it alone with a dairy product, like milk or yogurt or calcium-fortified juice. A special MedGuide will be given to you by the pharmacist with each prescription and refill. Be sure to read this information carefully each time. Talk to your pediatrician regarding the use of this medicine in children. Special care may be needed. Overdosage: If you think you have taken too much of this medicine contact a poison control center or emergency room at once. NOTE: This medicine is only for you. Do not share this medicine with others. What if I miss a dose? If you miss a dose, take it as soon as you can. If it is almost time for your next dose, take only that dose. Do not take double or extra doses. What may interact with this medicine? Do not take this medicine with any of the following medications: -cisapride -droperidol -terfenadine -tizanidine This medicine may also interact with the following medications: -antacids -birth control pills -caffeine -cyclosporin -didanosine (ddI) buffered tablets or powder -medicines for diabetes -medicines for inflammation like ibuprofen, naproxen -methotrexate -multivitamins -omeprazole -phenytoin -probenecid -sucralfate -theophylline -warfarin This list may not describe all possible interactions. Give your health care provider a list of all the medicines, herbs, non-prescription drugs, or dietary  supplements you use. Also tell them if you smoke, drink alcohol, or use illegal drugs. Some items may interact with your medicine. What should I watch for while using this medicine? Tell your doctor or health care professional if your symptoms do not improve. Do not treat diarrhea with over the counter products. Contact your doctor if you have diarrhea that lasts more than 2 days or if it is severe and watery. You may get drowsy or dizzy. Do not drive, use machinery, or do anything that needs mental alertness until you know how this medicine affects you. Do not stand or sit up quickly, especially if you are an older patient. This reduces the risk of dizzy or fainting spells. This medicine can make you more sensitive to the sun. Keep out of the sun. If you cannot avoid being in the sun, wear protective clothing and use sunscreen. Do not use sun lamps or tanning beds/booths. Avoid antacids, aluminum, calcium, iron, magnesium, and zinc products for 6 hours before and 2 hours after taking a dose of this medicine. What side effects may I notice from receiving this medicine? Side effects that you should report to your doctor or health care professional as soon as possible: - allergic reactions like skin rash, itching or hives, swelling of the face, lips, or tongue - breathing problems - confusion, nightmares or hallucinations - feeling faint or lightheaded, falls - irregular heartbeat - joint, muscle or tendon pain or swelling - pain or trouble passing urine -persistent headache with or without blurred vision - redness, blistering, peeling or loosening of the skin, including inside  the mouth - seizure - unusual pain, numbness, tingling, or weakness Side effects that usually do not require medical attention (report to your doctor or health care professional if they continue or are bothersome): - diarrhea - nausea or stomach upset - white patches or sores in the mouth This list may not  describe all possible side effects. Call your doctor for medical advice about side effects. You may report side effects to FDA at 1-800-FDA-1088. Where should I keep my medicine? Keep out of the reach of children. Store at room temperature below 30 degrees C (86 degrees F). Keep container tightly closed. Throw away any unused medicine after the expiration date. NOTE: This sheet is a summary. It may not cover all possible information. If you have questions about this medicine, talk to your doctor, pharmacist, or health care provider.  2015, [employer]. (2012-12-24 16:10:46)

## 2013-12-27 NOTE — Patient Instructions (Signed)
Dehydration, Adult Dehydration is when you lose more fluids from the body than you take in. Vital organs like the kidneys, brain, and heart cannot function without a proper amount of fluids and salt. Any loss of fluids from the body can cause dehydration.  CAUSES   Vomiting.  Diarrhea.  Excessive sweating.  Excessive urine output.  Fever. SYMPTOMS  Mild dehydration  Thirst.  Dry lips.  Slightly dry mouth. Moderate dehydration  Very dry mouth.  Sunken eyes.  Skin does not bounce back quickly when lightly pinched and released.  Dark urine and decreased urine production.  Decreased tear production.  Headache. Severe dehydration  Very dry mouth.  Extreme thirst.  Rapid, weak pulse (more than 100 beats per minute at rest).  Cold hands and feet.  Not able to sweat in spite of heat and temperature.  Rapid breathing.  Blue lips.  Confusion and lethargy.  Difficulty being awakened.  Minimal urine production.  No tears. DIAGNOSIS  Your caregiver will diagnose dehydration based on your symptoms and your exam. Blood and urine tests will help confirm the diagnosis. The diagnostic evaluation should also identify the cause of dehydration. TREATMENT  Treatment of mild or moderate dehydration can often be done at home by increasing the amount of fluids that you drink. It is best to drink small amounts of fluid more often. Drinking too much at one time can make vomiting worse. Refer to the home care instructions below. Severe dehydration needs to be treated at the hospital where you will probably be given intravenous (IV) fluids that contain water and electrolytes. HOME CARE INSTRUCTIONS   Ask your caregiver about specific rehydration instructions.  Drink enough fluids to keep your urine clear or pale yellow.  Drink small amounts frequently if you have nausea and vomiting.  Eat as you normally do.  Avoid:  Foods or drinks high in sugar.  Carbonated  drinks.  Juice.  Extremely hot or cold fluids.  Drinks with caffeine.  Fatty, greasy foods.  Alcohol.  Tobacco.  Overeating.  Gelatin desserts.  Wash your hands well to avoid spreading bacteria and viruses.  Only take over-the-counter or prescription medicines for pain, discomfort, or fever as directed by your caregiver.  Ask your caregiver if you should continue all prescribed and over-the-counter medicines.  Keep all follow-up appointments with your caregiver. SEEK MEDICAL CARE IF:  You have abdominal pain and it increases or stays in one area (localizes).  You have a rash, stiff neck, or severe headache.  You are irritable, sleepy, or difficult to awaken.  You are weak, dizzy, or extremely thirsty. SEEK IMMEDIATE MEDICAL CARE IF:   You are unable to keep fluids down or you get worse despite treatment.  You have frequent episodes of vomiting or diarrhea.  You have blood or green matter (bile) in your vomit.  You have blood in your stool or your stool looks black and tarry.  You have not urinated in 6 to 8 hours, or you have only urinated a small amount of very dark urine.  You have a fever.  You faint. MAKE SURE YOU:   Understand these instructions.  Will watch your condition.  Will get help right away if you are not doing well or get worse. Document Released: 05/20/2005 Document Revised: 08/12/2011 Document Reviewed: 01/07/2011 ExitCare Patient Information 2015 [employer]. This information is not intended to replace advice given to you by your health care provider. Make sure you discuss any questions you have with your health care   provider.  

## 2013-12-27 NOTE — Progress Notes (Signed)
ID: Wendy [name] OB: 24-Jun-1945  MR#: [id number]  CSN#:[id number]  PCP: PROVIDER NOT IN SYSTEM GYN:   SU: Dr. Dalbert [name] OTHER MD:  CHIEF COMPLAINT:Wendy [name] woman with T1 N0, stage IA, invasive ductal carcinoma, grade 2, ER negative, PR negative, Ki-67 of 13%, HER-2/neu negative.   BREAST CANCER HISTORY: Patient went for an annual mammogram and she was found to have an abnormality in the right breast at the 3:00 position. Subsequently had a right diagnostic mammogram performed. She also had a image guided biopsy of the right breast at the 3:00 position. 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 The tumor was ER negative PR negative HER-2/neu negative with a proliferation marker Ki-67 13%. All of her workup initially was performed in [place]. She subsequently was referred to Dr. Fanny [name] who saw her on 08/04/2003. He has recommended MRI of the breasts. This was performed on 08/09/2013. In the right breast there was an irregular enhancing mass in the 3:00 region measuring 1.5 x 1.0 x 1.4 cm. Also noted was nodular segmental enhancement extending 6.1 cm posteriorly from the mass towards the chest wall. The mass and nodular enhancement measured 7 cm. In the middle third of the lower outer quadrant of the right breast there was a 1.0 x 0.7 x 0.7 cm area of focal non-masslike enhancement. Left breast revealed no masses or abnormal enhancements. Lymph nodes appeared normal.  CURRENT THERAPY: Fluorouracil, Epirubicin, Cyclophosphamide cycle 5 day 8  INTERVAL HISTORY:  Wendy [name] is here today for evaluation following her 5th cycle of FEC chemotherapy.  It is given on day 1 of a 21 day cycle with Neulasta given on day 2 for granulocyte support.    Last Saturday, July 25 Wendy [name] had an episode of getting incredibly dizzy.  She walked about 2 tenths of a mile to her daughters house and sat on the swing.  She abruptly became dizzy and her husband had to lie her down on  the ground.  She states that she didn't lose consciousness but she did come close.  She laye on the ground for 10-15 minutes, wet cold cloths were applied to her face, and her husband drove her home.  When she got home to her house she vomited twice.  She did not feel dizzy, and then yesterday she had a very good day.  Today she says that she feels lethargic.  She denies fevers, chills, nausea, vomiting, constipation, diarrhea, numbness/tingling or any further concerns.    REVIEW OF SYSTEMS: A 10 point review of systems was conducted and is otherwise negative except for what is noted above.    PAST MEDICAL HISTORY: Past Medical History  Diagnosis Date  . Family history of anesthesia complication     "daughter PONV; long time in recovery" (09/07/2013)  . Asthma     as a child  . Asthmatic bronchitis     "about q yr" (09/07/2013)  . Pneumonia     "several times; usually follows asthmatic bronchitis" (09/07/2013)  . Sinus headache     "maybe 2-3 times/month"  . Breast cancer     "right breast"     PAST SURGICAL HISTORY: Past Surgical History  Procedure Laterality Date  . Tubal ligation  1977  . Finger fracture surgery Left 2010    "middle finger spiral fx; little finger broken; tissue damage"  . Tonsillectomy    . Portacath placement Right 09/07/2013  . Mastectomy complete / simple w/ sentinel node biopsy Right 09/07/2013  .  Mastectomy complete / simple  09/07/2013  . Breast biopsy Right 07/2013  . Dilation and curettage of uterus      S/P miscarriage  . Wrist fracture surgery Left 2010  . Mastectomy w/ sentinel node biopsy Bilateral 09/07/2013    Procedure: BILATERAL MASTECTOMY WITH RIGHT SENTINEL LYMPH NODE BIOPSY;  Surgeon: Wendy Hector, MD;  Location: [facility];  Service: General;  Laterality: Bilateral;  . Portacath placement Right 09/07/2013    Procedure: INSERTION PORT-A-CATH;  Surgeon: Wendy Hector, MD;  Location: [facility];  Service: General;  Laterality: Right;    FAMILY  HISTORY Family History  Problem Relation Age of Onset  . Bladder Cancer Sister 2    worked in [employer], around 2nd hand smoke all her life  . Diabetes Sister   . Colon cancer Brother 79    non-smoker  . Diabetes Brother   . Colon cancer Maternal Uncle 73  . Osteoporosis Mother   . Parkinson's disease Mother   . Diabetes Mother   . Rheum arthritis Father   . Diabetes Father   . Breast cancer Paternal Aunt     dx over 65  . Diabetes Maternal Grandmother   . Diabetes Maternal Grandfather   . Uterine cancer Other 50  . Colon cancer Maternal Aunt     dx in her 66s-70s  . Breast cancer Cousin     paternal cousin dx in her 80s-70s    GYNECOLOGIC HISTORY: menarche at age 88, G52, P2, patient took HRT for 4-5 years [name] menopause without problems, no h/o abnormal pap smears or sexually transmitted infections.    SOCIAL HISTORY:  Lives with husband of 61 years.  Patient is retired.  No tobacco, ETOH, illicit drug use.    ADVANCED DIRECTIVES:  In place, HCPOA husband Wendy [name] who can be reached at [phone].     HEALTH MAINTENANCE: History  Substance Use Topics  . Smoking status: Never Smoker   . Smokeless tobacco: Never Used  . Alcohol Use: No      Mammogram: 07/2013 Colonoscopy: 2013 Bone Density Scan: 08/2013 Pap Smear: 2013 Eye Exam: 2012 Vitamin D Level: unknown Lipid Panel: patient unsure likely 3-4 years ago   No Known Allergies  Current Outpatient Prescriptions  Medication Sig Dispense Refill  . Alum & Mag Hydroxide-Simeth (MAGIC MOUTHWASH) SOLN SWISH AND spit ONE TEASPOONFUL BY MOUTH FOUR TIMES DAILY AS NEEDED.  240 mL  1  . b complex vitamins tablet Take 1 tablet by mouth daily.      . Calcium Carbonate-Vitamin D (CALCIUM PLUS VITAMIN D PO) Take 1 tablet by mouth 2 (two) times daily.      Marland Kitchen CHERATUSSIN AC 100-10 MG/5ML syrup       . omeprazole (PRILOSEC) 40 MG capsule Take 1 capsule (40 mg total) by mouth daily.  30 capsule  5  . UNABLE TO FIND [id number] [name]  mastectomy camisole QTY: 2  Diagnosis Code 174.9  2 each  3  . ciprofloxacin (CIPRO) 500 MG tablet Take 1 tablet (500 mg total) by mouth 2 (two) times daily.  14 tablet  0  . dexamethasone (DECADRON) 4 MG tablet Take 2 tablets by mouth once a day on the day after chemotherapy and then take 2 tablets two times a day for 2 days. Take with food.  30 tablet  1  . lidocaine (XYLOCAINE) 2 % solution Use as directed 5 mLs in the mouth or throat 4 (four) times daily as needed for mouth pain (use with  current supply of MMW).  120 mL  1  . lidocaine-prilocaine (EMLA) cream Apply 1 application topically as needed.  30 g  1  . LORazepam (ATIVAN) 0.5 MG tablet TAKE ONE TABLET EVERY 6 HOURS AS NEEDED FOR NAUSEA AND VOMITING  30 tablet  0  . ondansetron (ZOFRAN) 8 MG tablet TAKE ONE TABLET TWICE DAILY AS NEEDED *START ON THE 3RD DAY AFTER CHEMOTHERAPY*  30 tablet  1  . prochlorperazine (COMPAZINE) 10 MG tablet TAKE ONE TABLET EVERY 6 HOURS AS NEEDED FOR NAUSEA AND VOMITING  30 tablet  1  . venlafaxine (EFFEXOR) 37.5 MG tablet Take 1 tablet (37.5 mg total) by mouth 2 (two) times daily.  60 tablet  0   No current facility-administered medications for this visit.    OBJECTIVE: Filed Vitals:   12/27/13 1349  BP: 88/53  Pulse: 81  Temp: 97.5 F (36.4 C)  Resp: 18     Body mass index is 24.22 kg/(m^2).     GENERAL: Patient is well appearing and in no acute distress.  HEENT:  Sclerae anicteric.  Oropharynx clear and moist. No ulcerations or evidence of oropharyngeal candidiasis. Neck is supple.  NODES:  No cervical, supraclavicular, or axillary lymphadenopathy palpated.  BREAST EXAM:  Deferred. LUNGS:  Clear to auscultation bilaterally.  No wheezes or rhonchi. HEART:  Regular rate and rhythm. No murmur appreciated. ABDOMEN:  Soft, nontender.  Positive, normoactive bowel sounds. No organomegaly palpated. MSK:  No focal spinal tenderness to palpation. Full range of motion bilaterally in the upper  extremities. EXTREMITIES:  No peripheral edema.   SKIN:  Clear with no obvious rashes or skin changes. No nail dyscrasia. NEURO:  Nonfocal. Well oriented.  Appropriate affect. ECOG FS:1  LAB RESULTS:  CMP     Component Value Date/Time   NA 136 12/27/2013 1340   NA 141 08/31/2013 1038   K 4.1 12/27/2013 1340   K 4.7 08/31/2013 1038   CL 101 08/31/2013 1038   CO2 29 12/27/2013 1340   CO2 27 08/31/2013 1038   GLUCOSE 125 12/27/2013 1340   GLUCOSE 99 08/31/2013 1038   BUN 20.6 12/27/2013 1340   BUN 18 08/31/2013 1038   CREATININE 0.8 12/27/2013 1340   CREATININE 0.75 09/07/2013 1652   CALCIUM 9.3 12/27/2013 1340   CALCIUM 9.8 08/31/2013 1038   PROT 6.3* 12/27/2013 1340   PROT 7.4 08/31/2013 1038   ALBUMIN 3.5 12/27/2013 1340   ALBUMIN 4.0 08/31/2013 1038   AST 12 12/27/2013 1340   AST 22 08/31/2013 1038   ALT 17 12/27/2013 1340   ALT 17 08/31/2013 1038   ALKPHOS 66 12/27/2013 1340   ALKPHOS 70 08/31/2013 1038   BILITOT 0.49 12/27/2013 1340   BILITOT 0.3 08/31/2013 1038   GFRNONAA 84* 09/07/2013 1652   GFRAA >90 09/07/2013 1652    I No results found for this basename: SPEP,  UPEP,   kappa and lambda light chains    Lab Results  Component Value Date   WBC 0.6* 12/27/2013   NEUTROABS 0.2* 12/27/2013   HGB 10.6* 12/27/2013   HCT 31.3* 12/27/2013   MCV 96.6 12/27/2013   PLT 49* 12/27/2013      Chemistry      Component Value Date/Time   NA 136 12/27/2013 1340   NA 141 08/31/2013 1038   K 4.1 12/27/2013 1340   K 4.7 08/31/2013 1038   CL 101 08/31/2013 1038   CO2 29 12/27/2013 1340   CO2 27 08/31/2013 1038   BUN  20.6 12/27/2013 1340   BUN 18 08/31/2013 1038   CREATININE 0.8 12/27/2013 1340   CREATININE 0.75 09/07/2013 1652      Component Value Date/Time   CALCIUM 9.3 12/27/2013 1340   CALCIUM 9.8 08/31/2013 1038   ALKPHOS 66 12/27/2013 1340   ALKPHOS 70 08/31/2013 1038   AST 12 12/27/2013 1340   AST 22 08/31/2013 1038   ALT 17 12/27/2013 1340   ALT 17 08/31/2013 1038   BILITOT 0.49 12/27/2013 1340    BILITOT 0.3 08/31/2013 1038       No results found for this basename: LABCA2    No components found with this basename: LABCA125    No results found for this basename: INR,  in the last 168 hours  Urinalysis    Component Value Date/Time   COLORURINE YELLOW 08/31/2013 [place] 08/31/2013 1018   LABSPEC 1.010 12/27/2013 1541   LABSPEC 1.007 08/31/2013 1018   PHURINE 6.5 08/31/2013 1018   GLUCOSEU Negative 12/27/2013 1541   GLUCOSEU NEGATIVE 08/31/2013 1018   HGBUR NEGATIVE 08/31/2013 1018   BILIRUBINUR NEGATIVE 08/31/2013 1018   KETONESUR NEGATIVE 08/31/2013 1018   PROTEINUR NEGATIVE 08/31/2013 1018   UROBILINOGEN 0.2 12/27/2013 1541   UROBILINOGEN 0.2 08/31/2013 1018   NITRITE NEGATIVE 08/31/2013 1018   LEUKOCYTESUR NEGATIVE 08/31/2013 1018    STUDIES: No results found.  ASSESSMENT: 68 y.o. [place] woman with T1 N0, stage IA, invasive ductal carcinoma, grade 2, ER negative, PR negative, Ki-67 of 13%, HER-2/neu negative.    1. The patient underwent bilateral mastectomy by Dr. Dalbert [name] on 09/07/2013, and a right port-a-cath was also placed at that time. Pathology revealed a grade 2, 1.7 cm invasive ductal carcinoma. Two lymph nodes were negative for malignancy. The tumor was ER negative, PR negative, HER-2/neu negative. Ki-67 was 13%.   2. Adjuvant Fluorouracil, Epirubicin, and Cyclophosphamide given on day 1 of a 21 day cycle with Neulasta given as granulocyte support was started on 09/27/13. A total of 6 cycles are planned.  3. As outlined by the 08/10/13 note from Dr Humphrey Rolls, the patient will then receive Carboplatin and Taxol weekly for 12 weeks.   PLAN:   Linde is not doing as well as she had hoped today.  She is subsequently neutropenic and thrombocytopenic following chemotherapy.  I reviewed neutropenic instructions with her in detail and she did receive a copy of these in her AVS as well.  She was prescribed Cipro BID as well.  She will also receive some IV hydration  today.    Due to her blood pressure being slightly decreased I have ordered blood cultures and a urinalysis and urine culture.  She does not have any respiratory symptoms, so I will forego a chest x ray for now.    Alyene will return on 01/10/14 for labs, evaluation and cycle 6 of FEC chemotherapy.  Due to her continued difficulty with this chemotherapy regimen, we will dose reduce her by 20%.  I reviewed this with the patient and her husband, and they are in agreement.   This case was reviewed with Dr. Jana Hakim who made the dose reduction recommendation.  I spent 40 minutes counseling the patient face to face.  The total time spent in the appointment was 50 minutes.  Minette Headland, [place] [phone] 12/29/2013 1:38 PM

## 2013-12-29 LAB — URINE CULTURE

## 2014-01-02 LAB — CULTURE, BLOOD (SINGLE)

## 2014-01-04 ENCOUNTER — Telehealth: Payer: Self-pay | Admitting: *Deleted

## 2014-01-04 NOTE — Telephone Encounter (Signed)
Mrs. Saephan called reporting she is to receive her last chemotherapy treatment 01-10-2014.  On last visit agreed to reduction of 20% but now wants to make sure she receives the full dose.  Reviewed office note and informed her this was recommended due to her blood counts low after treatments.  "I know but it's my last one and I want t he total amount."  Advised to discuss this at follow up and orders will be signed after the visit.  "I also want to make sure I receive the IVF x three days.  This needs to be scheduled now because we live in [place] and I can't wait around to be added on for IVF.  I'd like it scheduled after morning injection appointment."   Will notify providers.

## 2014-01-05 ENCOUNTER — Other Ambulatory Visit: Payer: Self-pay | Admitting: Oncology

## 2014-01-10 ENCOUNTER — Other Ambulatory Visit (HOSPITAL_BASED_OUTPATIENT_CLINIC_OR_DEPARTMENT_OTHER): Payer: Commercial Managed Care - HMO

## 2014-01-10 ENCOUNTER — Ambulatory Visit (HOSPITAL_BASED_OUTPATIENT_CLINIC_OR_DEPARTMENT_OTHER): Payer: Commercial Managed Care - HMO

## 2014-01-10 ENCOUNTER — Telehealth: Payer: Self-pay | Admitting: Hematology and Oncology

## 2014-01-10 ENCOUNTER — Encounter: Payer: Self-pay | Admitting: Adult Health

## 2014-01-10 ENCOUNTER — Ambulatory Visit: Payer: Commercial Managed Care - HMO | Admitting: Nutrition

## 2014-01-10 ENCOUNTER — Ambulatory Visit (HOSPITAL_BASED_OUTPATIENT_CLINIC_OR_DEPARTMENT_OTHER): Payer: Commercial Managed Care - HMO | Admitting: Adult Health

## 2014-01-10 VITALS — BP 110/53 | HR 71 | Temp 98.8°F | Resp 18 | Ht 66.0 in | Wt 153.3 lb

## 2014-01-10 DIAGNOSIS — C50919 Malignant neoplasm of unspecified site of unspecified female breast: Secondary | ICD-10-CM

## 2014-01-10 DIAGNOSIS — C50211 Malignant neoplasm of upper-inner quadrant of right female breast: Secondary | ICD-10-CM

## 2014-01-10 DIAGNOSIS — Z171 Estrogen receptor negative status [ER-]: Secondary | ICD-10-CM

## 2014-01-10 DIAGNOSIS — D649 Anemia, unspecified: Secondary | ICD-10-CM

## 2014-01-10 DIAGNOSIS — Z5111 Encounter for antineoplastic chemotherapy: Secondary | ICD-10-CM

## 2014-01-10 LAB — COMPREHENSIVE METABOLIC PANEL
ALT: 35 U/L (ref 0–35)
AST: 31 U/L (ref 0–37)
Albumin: 3.5 g/dL (ref 3.5–5.2)
Alkaline Phosphatase: 68 U/L (ref 39–117)
BUN: 13 mg/dL (ref 6–23)
CO2: 27 mEq/L (ref 19–32)
CREATININE: 0.82 mg/dL (ref 0.50–1.10)
Calcium: 9.4 mg/dL (ref 8.4–10.5)
Chloride: 105 mEq/L (ref 96–112)
Glucose, Bld: 94 mg/dL (ref 70–99)
Potassium: 4 mEq/L (ref 3.5–5.3)
SODIUM: 143 meq/L (ref 135–145)
Total Protein: 6.7 g/dL (ref 6.0–8.3)

## 2014-01-10 LAB — CBC WITH DIFFERENTIAL/PLATELET
BASO%: 0.7 % (ref 0.0–2.0)
Basophils Absolute: 0 10*3/uL (ref 0.0–0.1)
EOS%: 0.2 % (ref 0.0–7.0)
Eosinophils Absolute: 0 10*3/uL (ref 0.0–0.5)
HCT: 32.3 % — ABNORMAL LOW (ref 34.8–46.6)
HGB: 10.6 g/dL — ABNORMAL LOW (ref 11.6–15.9)
LYMPH#: 0.8 10*3/uL — AB (ref 0.9–3.3)
LYMPH%: 21.8 % (ref 14.0–49.7)
MCH: 33.3 pg (ref 25.1–34.0)
MCHC: 33 g/dL (ref 31.5–36.0)
MCV: 101 fL (ref 79.5–101.0)
MONO#: 0.6 10*3/uL (ref 0.1–0.9)
MONO%: 15.4 % — AB (ref 0.0–14.0)
NEUT#: 2.4 10*3/uL (ref 1.5–6.5)
NEUT%: 61.9 % (ref 38.4–76.8)
PLATELETS: 229 10*3/uL (ref 145–400)
RBC: 3.19 10*6/uL — ABNORMAL LOW (ref 3.70–5.45)
RDW: 17.1 % — ABNORMAL HIGH (ref 11.2–14.5)
WBC: 3.8 10*3/uL — ABNORMAL LOW (ref 3.9–10.3)

## 2014-01-10 MED ORDER — SODIUM CHLORIDE 0.9 % IV SOLN
Freq: Once | INTRAVENOUS | Status: AC
  Administered 2014-01-10: 13:00:00 via INTRAVENOUS

## 2014-01-10 MED ORDER — DEXAMETHASONE SODIUM PHOSPHATE 20 MG/5ML IJ SOLN
INTRAMUSCULAR | Status: AC
  Filled 2014-01-10: qty 5

## 2014-01-10 MED ORDER — PALONOSETRON HCL INJECTION 0.25 MG/5ML
INTRAVENOUS | Status: AC
  Filled 2014-01-10: qty 5

## 2014-01-10 MED ORDER — FLUOROURACIL CHEMO INJECTION 2.5 GM/50ML
500.0000 mg/m2 | Freq: Once | INTRAVENOUS | Status: AC
  Administered 2014-01-10: 950 mg via INTRAVENOUS
  Filled 2014-01-10: qty 19

## 2014-01-10 MED ORDER — EPIRUBICIN HCL CHEMO IV INJECTION 200 MG/100ML
100.0000 mg/m2 | Freq: Once | INTRAVENOUS | Status: AC
  Administered 2014-01-10: 186 mg via INTRAVENOUS
  Filled 2014-01-10: qty 93

## 2014-01-10 MED ORDER — DEXAMETHASONE SODIUM PHOSPHATE 20 MG/5ML IJ SOLN
12.0000 mg | Freq: Once | INTRAMUSCULAR | Status: AC
  Administered 2014-01-10: 12 mg via INTRAVENOUS

## 2014-01-10 MED ORDER — PALONOSETRON HCL INJECTION 0.25 MG/5ML
0.2500 mg | Freq: Once | INTRAVENOUS | Status: AC
  Administered 2014-01-10: 0.25 mg via INTRAVENOUS

## 2014-01-10 MED ORDER — SODIUM CHLORIDE 0.9 % IV SOLN
150.0000 mg | Freq: Once | INTRAVENOUS | Status: AC
  Administered 2014-01-10: 150 mg via INTRAVENOUS
  Filled 2014-01-10: qty 5

## 2014-01-10 MED ORDER — SODIUM CHLORIDE 0.9 % IV SOLN
500.0000 mg/m2 | Freq: Once | INTRAVENOUS | Status: AC
  Administered 2014-01-10: 940 mg via INTRAVENOUS
  Filled 2014-01-10: qty 47

## 2014-01-10 MED ORDER — HEPARIN SOD (PORK) LOCK FLUSH 100 UNIT/ML IV SOLN
500.0000 [IU] | Freq: Once | INTRAVENOUS | Status: AC | PRN
  Administered 2014-01-10: 500 [IU]
  Filled 2014-01-10: qty 5

## 2014-01-10 MED ORDER — SODIUM CHLORIDE 0.9 % IJ SOLN
10.0000 mL | INTRAMUSCULAR | Status: DC | PRN
  Administered 2014-01-10: 10 mL
  Filled 2014-01-10: qty 10

## 2014-01-10 NOTE — Progress Notes (Signed)
Brief followup with patient in chemotherapy.  Patient was eating a salad today.  Oral intake has improved with weight stable over the past 2 months.  Weight documented as 153.3 pounds August 10.  Patient is applying strategies for taste alterations and decreased intake.  Nutrition diagnosis: Inadequate oral intake resolved.  Encouraged patient to continue strategies as needed for adequate calories and protein to promote weight stability.  There is no followup scheduled however patient has my contact information and agrees to call me with questions or concerns.    **Disclaimer: This note was dictated with voice recognition software. Similar sounding words can inadvertently be transcribed and this note may contain transcription errors which may not have been corrected upon publication of note.**

## 2014-01-10 NOTE — Telephone Encounter (Signed)
[facility] PT APPT SCHEDULE FOR AUG THRU NOV.

## 2014-01-10 NOTE — Patient Instructions (Signed)
Paclitaxel injection What is this medicine? PACLITAXEL (PAK li TAX el) is a chemotherapy drug. It targets fast dividing cells, like cancer cells, and causes these cells to die. This medicine is used to treat ovarian cancer, breast cancer, and other cancers. This medicine may be used for other purposes; ask your health care provider or pharmacist if you have questions. COMMON BRAND NAME(S): Onxol, Taxol What should I tell my health care provider before I take this medicine? They need to know if you have any of these conditions: -blood disorders -irregular heartbeat -infection (especially a virus infection such as chickenpox, cold sores, or herpes) -liver disease -previous or ongoing radiation therapy -an unusual or allergic reaction to paclitaxel, alcohol, polyoxyethylated castor oil, other chemotherapy agents, other medicines, foods, dyes, or preservatives -pregnant or trying to get pregnant -breast-feeding How should I use this medicine? This drug is given as an infusion into a vein. It is administered in a hospital or clinic by a specially trained health care professional. Talk to your pediatrician regarding the use of this medicine in children. Special care may be needed. Overdosage: If you think you have taken too much of this medicine contact a poison control center or emergency room at once. NOTE: This medicine is only for you. Do not share this medicine with others. What if I miss a dose? It is important not to miss your dose. Call your doctor or health care professional if you are unable to keep an appointment. What may interact with this medicine? Do not take this medicine with any of the following medications: -disulfiram -metronidazole This medicine may also interact with the following medications: -cyclosporine -diazepam -ketoconazole -medicines to increase blood counts like filgrastim, pegfilgrastim, sargramostim -other chemotherapy drugs like cisplatin, doxorubicin,  epirubicin, etoposide, teniposide, vincristine -quinidine -testosterone -vaccines -verapamil Talk to your doctor or health care professional before taking any of these medicines: -acetaminophen -aspirin -ibuprofen -ketoprofen -naproxen This list may not describe all possible interactions. Give your health care provider a list of all the medicines, herbs, non-prescription drugs, or dietary supplements you use. Also tell them if you smoke, drink alcohol, or use illegal drugs. Some items may interact with your medicine. What should I watch for while using this medicine? Your condition will be monitored carefully while you are receiving this medicine. You will need important blood work done while you are taking this medicine. This drug may make you feel generally unwell. This is not uncommon, as chemotherapy can affect healthy cells as well as cancer cells. Report any side effects. Continue your course of treatment even though you feel ill unless your doctor tells you to stop. In some cases, you may be given additional medicines to help with side effects. Follow all directions for their use. Call your doctor or health care professional for advice if you get a fever, chills or sore throat, or other symptoms of a cold or flu. Do not treat yourself. This drug decreases your body's ability to fight infections. Try to avoid being around people who are sick. This medicine may increase your risk to bruise or bleed. Call your doctor or health care professional if you notice any unusual bleeding. Be careful brushing and flossing your teeth or using a toothpick because you may get an infection or bleed more easily. If you have any dental work done, tell your dentist you are receiving this medicine. Avoid taking products that contain aspirin, acetaminophen, ibuprofen, naproxen, or ketoprofen unless instructed by your doctor. These medicines may hide a fever.   Do not become pregnant while taking this medicine.  Women should inform their doctor if they wish to become pregnant or think they might be pregnant. There is a potential for serious side effects to an unborn child. Talk to your health care professional or pharmacist for more information. Do not breast-feed an infant while taking this medicine. Men are advised not to father a child while receiving this medicine. What side effects may I notice from receiving this medicine? Side effects that you should report to your doctor or health care professional as soon as possible: -allergic reactions like skin rash, itching or hives, swelling of the face, lips, or tongue -low blood counts - This drug may decrease the number of white blood cells, red blood cells and platelets. You may be at increased risk for infections and bleeding. -signs of infection - fever or chills, cough, sore throat, pain or difficulty passing urine -signs of decreased platelets or bleeding - bruising, pinpoint red spots on the skin, black, tarry stools, nosebleeds -signs of decreased red blood cells - unusually weak or tired, fainting spells, lightheadedness -breathing problems -chest pain -high or low blood pressure -mouth sores -nausea and vomiting -pain, swelling, redness or irritation at the injection site -pain, tingling, numbness in the hands or feet -slow or irregular heartbeat -swelling of the ankle, feet, hands Side effects that usually do not require medical attention (report to your doctor or health care professional if they continue or are bothersome): -bone pain -complete hair loss including hair on your head, underarms, pubic hair, eyebrows, and eyelashes -changes in the color of fingernails -diarrhea -loosening of the fingernails -loss of appetite -muscle or joint pain -red flush to skin -sweating This list may not describe all possible side effects. Call your doctor for medical advice about side effects. You may report side effects to FDA at  1-800-FDA-1088. Where should I keep my medicine? This drug is given in a hospital or clinic and will not be stored at home. NOTE: This sheet is a summary. It may not cover all possible information. If you have questions about this medicine, talk to your doctor, pharmacist, or health care provider.  2015, [employer]. (2012-07-13 16:41:21)  Carboplatin injection What is this medicine? CARBOPLATIN (KAR boe pla tin) is a chemotherapy drug. It targets fast dividing cells, like cancer cells, and causes these cells to die. This medicine is used to treat ovarian cancer and many other cancers. This medicine may be used for other purposes; ask your health care provider or pharmacist if you have questions. COMMON BRAND NAME(S): Paraplatin What should I tell my health care provider before I take this medicine? They need to know if you have any of these conditions: -blood disorders -hearing problems -kidney disease -recent or ongoing radiation therapy -an unusual or allergic reaction to carboplatin, cisplatin, other chemotherapy, other medicines, foods, dyes, or preservatives -pregnant or trying to get pregnant -breast-feeding How should I use this medicine? This drug is usually given as an infusion into a vein. It is administered in a hospital or clinic by a specially trained health care professional. Talk to your pediatrician regarding the use of this medicine in children. Special care may be needed. Overdosage: If you think you have taken too much of this medicine contact a poison control center or emergency room at once. NOTE: This medicine is only for you. Do not share this medicine with others. What if I miss a dose? It is important not to miss a dose. Call your   doctor or health care professional if you are unable to keep an appointment. What may interact with this medicine? -medicines for seizures -medicines to increase blood counts like filgrastim, pegfilgrastim,  sargramostim -some antibiotics like amikacin, gentamicin, neomycin, streptomycin, tobramycin -vaccines Talk to your doctor or health care professional before taking any of these medicines: -acetaminophen -aspirin -ibuprofen -ketoprofen -naproxen This list may not describe all possible interactions. Give your health care provider a list of all the medicines, herbs, non-prescription drugs, or dietary supplements you use. Also tell them if you smoke, drink alcohol, or use illegal drugs. Some items may interact with your medicine. What should I watch for while using this medicine? Your condition will be monitored carefully while you are receiving this medicine. You will need important blood work done while you are taking this medicine. This drug may make you feel generally unwell. This is not uncommon, as chemotherapy can affect healthy cells as well as cancer cells. Report any side effects. Continue your course of treatment even though you feel ill unless your doctor tells you to stop. In some cases, you may be given additional medicines to help with side effects. Follow all directions for their use. Call your doctor or health care professional for advice if you get a fever, chills or sore throat, or other symptoms of a cold or flu. Do not treat yourself. This drug decreases your body's ability to fight infections. Try to avoid being around people who are sick. This medicine may increase your risk to bruise or bleed. Call your doctor or health care professional if you notice any unusual bleeding. Be careful brushing and flossing your teeth or using a toothpick because you may get an infection or bleed more easily. If you have any dental work done, tell your dentist you are receiving this medicine. Avoid taking products that contain aspirin, acetaminophen, ibuprofen, naproxen, or ketoprofen unless instructed by your doctor. These medicines may hide a fever. Do not become pregnant while taking this  medicine. Women should inform their doctor if they wish to become pregnant or think they might be pregnant. There is a potential for serious side effects to an unborn child. Talk to your health care professional or pharmacist for more information. Do not breast-feed an infant while taking this medicine. What side effects may I notice from receiving this medicine? Side effects that you should report to your doctor or health care professional as soon as possible: -allergic reactions like skin rash, itching or hives, swelling of the face, lips, or tongue -signs of infection - fever or chills, cough, sore throat, pain or difficulty passing urine -signs of decreased platelets or bleeding - bruising, pinpoint red spots on the skin, black, tarry stools, nosebleeds -signs of decreased red blood cells - unusually weak or tired, fainting spells, lightheadedness -breathing problems -changes in hearing -changes in vision -chest pain -high blood pressure -low blood counts - This drug may decrease the number of white blood cells, red blood cells and platelets. You may be at increased risk for infections and bleeding. -nausea and vomiting -pain, swelling, redness or irritation at the injection site -pain, tingling, numbness in the hands or feet -problems with balance, talking, walking -trouble passing urine or change in the amount of urine Side effects that usually do not require medical attention (report to your doctor or health care professional if they continue or are bothersome): -hair loss -loss of appetite -metallic taste in the mouth or changes in taste This list may not describe all   possible side effects. Call your doctor for medical advice about side effects. You may report side effects to FDA at 1-800-FDA-1088. Where should I keep my medicine? This drug is given in a hospital or clinic and will not be stored at home. NOTE: This sheet is a summary. It may not cover all possible information. If you  have questions about this medicine, talk to your doctor, pharmacist, or health care provider.  2015, [employer]. (2007-08-25 14:38:05)  

## 2014-01-10 NOTE — Progress Notes (Addendum)
ID: Wendy [name] OB: Mar 07, 1946  MR#: [id number]  CSN#:[id number]  PCP: PROVIDER NOT IN SYSTEM GYN:   SU: Dr. Dalbert Batman OTHER MD:  CHIEF COMPLAINT:Wendy [name] woman with T1 N0, stage IA, invasive ductal carcinoma, grade 2, ER negative, PR negative, Ki-67 of 13%, HER-2/neu negative.   BREAST CANCER HISTORY: Patient went for an annual mammogram and she was found to have an abnormality in the right breast at the 3:00 position. Subsequently had a right diagnostic mammogram performed. She also had a image guided biopsy of the right breast at the 3:00 position. 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 The tumor was ER negative PR negative HER-2/neu negative with a proliferation marker Ki-67 13%. All of her workup initially was performed in [place]. She subsequently was referred to Dr. Fanny Skates who saw her on 08/04/2003. He has recommended MRI of the breasts. This was performed on 08/09/2013. In the right breast there was an irregular enhancing mass in the 3:00 region measuring 1.5 x 1.0 x 1.4 cm. Also noted was nodular segmental enhancement extending 6.1 cm posteriorly from the mass towards the chest wall. The mass and nodular enhancement measured 7 cm. In the middle third of the lower outer quadrant of the right breast there was a 1.0 x 0.7 x 0.7 cm area of focal non-masslike enhancement. Left breast revealed no masses or abnormal enhancements. Lymph nodes appeared normal.  CURRENT THERAPY: Fluorouracil, Epirubicin, Cyclophosphamide cycle 6 day 1  INTERVAL HISTORY:  Wendy [name] is here today accompanied by her husband for evaluation prior to receiving her sixth cycle of FEC chemotherapy.  It is given on day 1 of a 21 day cycle with Neulasta given on day 2 for granulocyte support.    Wendy [name] is doing well.  She and her husband just celebrated their 19th anniversary yesterday.  She is concerned about receiving a dose reduction of her chemotherapy as recommended by  Dr. Jana Hakim, she would like to receive full dose chemotherapy.  She has had significant food aversion that follows the FEC chemotherapy and lasts as long as 2 weeks.  These past two weeks, she has been eating more solids and hasn't been drinking as many protein drinks because she has been eating so well.  Her husband was concerned about her being depressed due to chemotherapy at her last appointment.  I spoke with them about this previously and did prescribe Effexor, however she does not feel like she needs it quite yet at this point in her therapy.  She denies fevers, chills, nausea, vomiting, constipation, diarrhea, numbness/tingling, skin changes, nail changes, or any further concerns.     REVIEW OF SYSTEMS: A 10 point review of systems was conducted and is otherwise negative except for what is noted above.    PAST MEDICAL HISTORY: Past Medical History  Diagnosis Date  . Family history of anesthesia complication     "daughter PONV; long time in recovery" (09/07/2013)  . Asthma     as a child  . Asthmatic bronchitis     "about q yr" (09/07/2013)  . Pneumonia     "several times; usually follows asthmatic bronchitis" (09/07/2013)  . Sinus headache     "maybe 2-3 times/month"  . Breast cancer     "right breast"     PAST SURGICAL HISTORY: Past Surgical History  Procedure Laterality Date  . Tubal ligation  1977  . Finger fracture surgery Left 2010    "middle finger spiral fx; little finger broken; tissue damage"  .  Tonsillectomy    . Portacath placement Right 09/07/2013  . Mastectomy complete / simple w/ sentinel node biopsy Right 09/07/2013  . Mastectomy complete / simple  09/07/2013  . Breast biopsy Right 07/2013  . Dilation and curettage of uterus      S/P miscarriage  . Wrist fracture surgery Left 2010  . Mastectomy w/ sentinel node biopsy Bilateral 09/07/2013    Procedure: BILATERAL MASTECTOMY WITH RIGHT SENTINEL LYMPH NODE BIOPSY;  Surgeon: [name], MD;  Location: [facility];  Service:  General;  Laterality: Bilateral;  . Portacath placement Right 09/07/2013    Procedure: INSERTION PORT-A-CATH;  Surgeon: [name], MD;  Location: [facility];  Service: General;  Laterality: Right;    FAMILY HISTORY Family History  Problem Relation Age of Onset  . Bladder Cancer Sister 49    worked in [employer], around 2nd hand smoke all her life  . Diabetes Sister   . Colon cancer Brother 88    non-smoker  . Diabetes Brother   . Colon cancer Maternal Uncle 38  . Osteoporosis Mother   . Parkinson's disease Mother   . Diabetes Mother   . Rheum arthritis Father   . Diabetes Father   . Breast cancer Paternal Aunt     dx over 20  . Diabetes Maternal Grandmother   . Diabetes Maternal Grandfather   . Uterine cancer Other 50  . Colon cancer Maternal Aunt     dx in her 22s-70s  . Breast cancer Cousin     paternal cousin dx in her 89s-70s    GYNECOLOGIC HISTORY: menarche at age 35, G41, P2, patient took HRT for 4-5 years [name] menopause without problems, no h/o abnormal pap smears or sexually transmitted infections.    SOCIAL HISTORY:  Lives with husband of 77 years.  Patient is retired.  No tobacco, ETOH, illicit drug use.    ADVANCED DIRECTIVES:  In place, HCPOA husband Wendy [name] who can be reached at [phone].     HEALTH MAINTENANCE: History  Substance Use Topics  . Smoking status: Never Smoker   . Smokeless tobacco: Never Used  . Alcohol Use: No      Mammogram: 07/2013 Colonoscopy: 2013 Bone Density Scan: 08/2013 Pap Smear: 2013 Eye Exam: 2012 Vitamin D Level: unknown Lipid Panel: patient unsure likely 3-4 years ago   No Known Allergies  Current Outpatient Prescriptions  Medication Sig Dispense Refill  . b complex vitamins tablet Take 1 tablet by mouth daily.      . Calcium Carbonate-Vitamin D (CALCIUM PLUS VITAMIN D PO) Take 1 tablet by mouth 2 (two) times daily.      Marland Kitchen dexamethasone (DECADRON) 4 MG tablet Take 2 tablets by mouth once a day on the day after  chemotherapy and then take 2 tablets two times a day for 2 days. Take with food.  30 tablet  1  . lidocaine-prilocaine (EMLA) cream Apply 1 application topically as needed.  30 g  1  . LORazepam (ATIVAN) 0.5 MG tablet TAKE ONE TABLET EVERY 6 HOURS AS NEEDED FOR NAUSEA AND VOMITING  30 tablet  0  . omeprazole (PRILOSEC) 40 MG capsule Take 1 capsule (40 mg total) by mouth daily.  30 capsule  5  . ondansetron (ZOFRAN) 8 MG tablet TAKE ONE TABLET TWICE DAILY AS NEEDED *START ON THE 3RD DAY AFTER CHEMOTHERAPY*  30 tablet  1  . prochlorperazine (COMPAZINE) 10 MG tablet TAKE ONE TABLET EVERY 6 HOURS AS NEEDED FOR NAUSEA AND VOMITING  30  tablet  1  . UNABLE TO FIND [id number] [name] mastectomy camisole QTY: 2  Diagnosis Code 174.9  2 each  3  . venlafaxine (EFFEXOR) 37.5 MG tablet Take 1 tablet (37.5 mg total) by mouth 2 (two) times daily.  60 tablet  0  . Alum & Mag Hydroxide-Simeth (MAGIC MOUTHWASH) SOLN SWISH AND spit ONE TEASPOONFUL BY MOUTH FOUR TIMES DAILY AS NEEDED.  240 mL  1  . CHERATUSSIN AC 100-10 MG/5ML syrup       . lidocaine (XYLOCAINE) 2 % solution Use as directed 5 mLs in the mouth or throat 4 (four) times daily as needed for mouth pain (use with current supply of MMW).  120 mL  1   No current facility-administered medications for this visit.   Facility-Administered Medications Ordered in Other Visits  Medication Dose Route Frequency Provider Last Rate Last Dose  . cyclophosphamide (CYTOXAN) 940 mg in sodium chloride 0.9 % 250 mL chemo infusion  500 mg/m2 (Treatment Plan Actual) Intravenous Once Rulon Eisenmenger, MD      . Dexamethasone Sodium Phosphate (DECADRON) injection 12 mg  12 mg Intravenous Once Rulon Eisenmenger, MD      . epirubicin (ELLENCE) chemo injection 186 mg  100 mg/m2 (Treatment Plan Actual) Intravenous Once Rulon Eisenmenger, MD      . fluorouracil (ADRUCIL) chemo injection 950 mg  500 mg/m2 (Treatment Plan Actual) Intravenous Once Rulon Eisenmenger, MD      . fosaprepitant (EMEND) 150  mg in sodium chloride 0.9 % 145 mL IVPB  150 mg Intravenous Once Rulon Eisenmenger, MD      . heparin lock flush 100 unit/mL  500 Units Intracatheter Once PRN Rulon Eisenmenger, MD      . sodium chloride 0.9 % injection 10 mL  10 mL Intracatheter PRN Rulon Eisenmenger, MD        OBJECTIVE: Filed Vitals:   01/10/14 1057  BP: 110/53  Pulse: 71  Temp: 98.8 F (37.1 C)  Resp: 18     Body mass index is 24.75 kg/(m^2).     GENERAL: Patient is well appearing and in no acute distress.  HEENT:  Sclerae anicteric.  Oropharynx clear and moist. No ulcerations or evidence of oropharyngeal candidiasis. Neck is supple.  NODES:  No cervical, supraclavicular, or axillary lymphadenopathy palpated.  BREAST EXAM:  Deferred. LUNGS:  Clear to auscultation bilaterally.  No wheezes or rhonchi. HEART:  Regular rate and rhythm. No murmur appreciated. ABDOMEN:  Soft, nontender.  Positive, normoactive bowel sounds. No organomegaly palpated. MSK:  No focal spinal tenderness to palpation. Full range of motion bilaterally in the upper extremities. EXTREMITIES:  No peripheral edema.   SKIN:  Clear with no obvious rashes or skin changes. No nail dyscrasia. NEURO:  Nonfocal. Well oriented.  Appropriate affect. ECOG FS:1  LAB RESULTS:  CMP     Component Value Date/Time   NA 143 01/10/2014 1045   NA 136 12/27/2013 1340   K 4.0 01/10/2014 1045   K 4.1 12/27/2013 1340   CL 105 01/10/2014 1045   CO2 27 01/10/2014 1045   CO2 29 12/27/2013 1340   GLUCOSE 94 01/10/2014 1045   GLUCOSE 125 12/27/2013 1340   BUN 13 01/10/2014 1045   BUN 20.6 12/27/2013 1340   CREATININE 0.82 01/10/2014 1045   CREATININE 0.8 12/27/2013 1340   CALCIUM 9.4 01/10/2014 1045   CALCIUM 9.3 12/27/2013 1340   PROT 6.7 01/10/2014 1045   PROT 6.3* 12/27/2013 1340  ALBUMIN 3.5 01/10/2014 1045   ALBUMIN 3.5 12/27/2013 1340   AST 31 01/10/2014 1045   AST 12 12/27/2013 1340   ALT 35 01/10/2014 1045   ALT 17 12/27/2013 1340   ALKPHOS 68 01/10/2014 1045   ALKPHOS 66  12/27/2013 1340   BILITOT <0.2* 01/10/2014 1045   BILITOT 0.49 12/27/2013 1340   GFRNONAA 84* 09/07/2013 1652   GFRAA >90 09/07/2013 1652    I No results found for this basename: SPEP,  UPEP,   kappa and lambda light chains    Lab Results  Component Value Date   WBC 3.8* 01/10/2014   NEUTROABS 2.4 01/10/2014   HGB 10.6* 01/10/2014   HCT 32.3* 01/10/2014   MCV 101.0 01/10/2014   PLT 229 01/10/2014      Chemistry      Component Value Date/Time   NA 143 01/10/2014 1045   NA 136 12/27/2013 1340   K 4.0 01/10/2014 1045   K 4.1 12/27/2013 1340   CL 105 01/10/2014 1045   CO2 27 01/10/2014 1045   CO2 29 12/27/2013 1340   BUN 13 01/10/2014 1045   BUN 20.6 12/27/2013 1340   CREATININE 0.82 01/10/2014 1045   CREATININE 0.8 12/27/2013 1340      Component Value Date/Time   CALCIUM 9.4 01/10/2014 1045   CALCIUM 9.3 12/27/2013 1340   ALKPHOS 68 01/10/2014 1045   ALKPHOS 66 12/27/2013 1340   AST 31 01/10/2014 1045   AST 12 12/27/2013 1340   ALT 35 01/10/2014 1045   ALT 17 12/27/2013 1340   BILITOT <0.2* 01/10/2014 1045   BILITOT 0.49 12/27/2013 1340       No results found for this basename: LABCA2    No components found with this basename: LABCA125    No results found for this basename: INR,  in the last 168 hours  Urinalysis    Component Value Date/Time   COLORURINE YELLOW 08/31/2013 [place] 08/31/2013 1018   LABSPEC 1.010 12/27/2013 1541   LABSPEC 1.007 08/31/2013 1018   PHURINE 6.5 08/31/2013 1018   GLUCOSEU Negative 12/27/2013 1541   GLUCOSEU NEGATIVE 08/31/2013 1018   HGBUR NEGATIVE 08/31/2013 1018   BILIRUBINUR NEGATIVE 08/31/2013 1018   KETONESUR NEGATIVE 08/31/2013 1018   PROTEINUR NEGATIVE 08/31/2013 1018   UROBILINOGEN 0.2 12/27/2013 1541   UROBILINOGEN 0.2 08/31/2013 1018   NITRITE NEGATIVE 08/31/2013 1018   LEUKOCYTESUR NEGATIVE 08/31/2013 1018    STUDIES: No results found.  ASSESSMENT: 68 y.o. [place] woman with T1 N0, stage IA, invasive ductal carcinoma, grade 2, ER  negative, PR negative, Ki-67 of 13%, HER-2/neu negative.    1. The patient underwent bilateral mastectomy by Dr. Dalbert Batman on 09/07/2013, and a right port-a-cath was also placed at that time. Pathology revealed a grade 2, 1.7 cm invasive ductal carcinoma. Two lymph nodes were negative for malignancy. The tumor was ER negative, PR negative, HER-2/neu negative. Ki-67 was 13%.   2. Adjuvant Fluorouracil, Epirubicin, and Cyclophosphamide given on day 1 of a 21 day cycle with Neulasta given as granulocyte support was started on 09/27/13. A total of 6 cycles are planned.  3. As outlined by the 08/10/13 note from Dr Humphrey Rolls, the patient will then receive Carboplatin and Taxol weekly for 12 weeks.   PLAN:   Wendy [name] is feeling well today.  Her lab work was reviewed and remains stable.  I reviewed it with her in detail, she remains anemic which is likely related to her treatment.  She will proceed with  cycle 6 of FEC today.  I reviewed the patient and her case with Dr. Lindi Adie (see his addendum), and she will receive full treatment doses today.   Her dietary intake has improved.  I encouraged her to continue with PO intake and to supplement with Ensure when needed.    We also discussed her next chemotherapy regimen consisting of Paclitaxel and Carboplatin.  I gave her detailed information about this treatment in her AVS.    Wendy [name] will return tomorrow for Neulasta and IV fluids, on Wednesday for IV fluids, and on Monday for labs, evaluation and IV fluids.   She knows to call us in the interim for any questions or concerns.  We can certainly see her sooner if needed.  I spent 40 minutes counseling the patient face to face.  The total time spent in the appointment was 50 minutes.  Minette Headland, NP Medical Oncology [facility] [phone] 01/10/2014 1:33 PM   Attending Note  I personally saw and examined Wendy [name]. The plan of care was discussed with her. I agree with the assessment and  plan as documented above. Plan: 1. Patient to receive cycle 6 of FEC chemo. I discussed with her and agreed to give full dose of chemo. Instructed her to call if she develops fevers of any other side effects 2. Monitoring closely for toxicities 3. In 3 weeks Patient will start Taxol- carboplatin 4. Councelled chemo risks and benefits.  Signed Rulon Eisenmenger, MD

## 2014-01-10 NOTE — Telephone Encounter (Signed)
ADDED IVF'S 8/11, 8/13 AND 8/17. PT AWARE AND GIVEN NEW SCHEDULE.

## 2014-01-10 NOTE — Patient Instructions (Signed)
[facility] Discharge Instructions for Patients Receiving Chemotherapy  Today you received the following chemotherapy agents Cytoxan, 5FU, Epirubicin   To help prevent nausea and vomiting after your treatment, we encourage you to take your nausea medication as directed.   If you develop nausea and vomiting that is not controlled by your nausea medication, call the clinic.   BELOW ARE SYMPTOMS THAT SHOULD BE REPORTED IMMEDIATELY:  *FEVER GREATER THAN 100.5 F  *CHILLS WITH OR WITHOUT FEVER  NAUSEA AND VOMITING THAT IS NOT CONTROLLED WITH YOUR NAUSEA MEDICATION  *UNUSUAL SHORTNESS OF BREATH  *UNUSUAL BRUISING OR BLEEDING  TENDERNESS IN MOUTH AND THROAT WITH OR WITHOUT PRESENCE OF ULCERS  *URINARY PROBLEMS  *BOWEL PROBLEMS  UNUSUAL RASH Items with * indicate a potential emergency and should be followed up as soon as possible.  Feel free to call the clinic you have any questions or concerns. The clinic phone number is [phone].

## 2014-01-11 ENCOUNTER — Ambulatory Visit: Payer: Commercial Managed Care - HMO

## 2014-01-11 ENCOUNTER — Other Ambulatory Visit: Payer: Self-pay | Admitting: *Deleted

## 2014-01-11 ENCOUNTER — Ambulatory Visit (HOSPITAL_BASED_OUTPATIENT_CLINIC_OR_DEPARTMENT_OTHER): Payer: Commercial Managed Care - HMO

## 2014-01-11 VITALS — BP 108/42 | HR 78 | Temp 97.7°F

## 2014-01-11 DIAGNOSIS — C50211 Malignant neoplasm of upper-inner quadrant of right female breast: Secondary | ICD-10-CM

## 2014-01-11 DIAGNOSIS — C50919 Malignant neoplasm of unspecified site of unspecified female breast: Secondary | ICD-10-CM

## 2014-01-11 DIAGNOSIS — Z5189 Encounter for other specified aftercare: Secondary | ICD-10-CM

## 2014-01-11 MED ORDER — SODIUM CHLORIDE 0.9 % IV SOLN
Freq: Once | INTRAVENOUS | Status: AC
  Administered 2014-01-11: 14:00:00 via INTRAVENOUS

## 2014-01-11 MED ORDER — SODIUM CHLORIDE 0.9 % IJ SOLN
10.0000 mL | INTRAMUSCULAR | Status: DC | PRN
  Administered 2014-01-11: 10 mL
  Filled 2014-01-11: qty 10

## 2014-01-11 MED ORDER — HEPARIN SOD (PORK) LOCK FLUSH 100 UNIT/ML IV SOLN
500.0000 [IU] | Freq: Once | INTRAVENOUS | Status: AC | PRN
  Administered 2014-01-11: 500 [IU]
  Filled 2014-01-11: qty 5

## 2014-01-11 MED ORDER — PEGFILGRASTIM INJECTION 6 MG/0.6ML
6.0000 mg | Freq: Once | SUBCUTANEOUS | Status: AC
  Administered 2014-01-11: 6 mg via SUBCUTANEOUS
  Filled 2014-01-11: qty 0.6

## 2014-01-11 NOTE — Patient Instructions (Signed)
Dehydration, Adult Dehydration is when you lose more fluids from the body than you take in. Vital organs like the kidneys, brain, and heart cannot function without a proper amount of fluids and salt. Any loss of fluids from the body can cause dehydration.  CAUSES   Vomiting.  Diarrhea.  Excessive sweating.  Excessive urine output.  Fever. SYMPTOMS  Mild dehydration  Thirst.  Dry lips.  Slightly dry mouth. Moderate dehydration  Very dry mouth.  Sunken eyes.  Skin does not bounce back quickly when lightly pinched and released.  Dark urine and decreased urine production.  Decreased tear production.  Headache. Severe dehydration  Very dry mouth.  Extreme thirst.  Rapid, weak pulse (more than 100 beats per minute at rest).  Cold hands and feet.  Not able to sweat in spite of heat and temperature.  Rapid breathing.  Blue lips.  Confusion and lethargy.  Difficulty being awakened.  Minimal urine production.  No tears. DIAGNOSIS  Your caregiver will diagnose dehydration based on your symptoms and your exam. Blood and urine tests will help confirm the diagnosis. The diagnostic evaluation should also identify the cause of dehydration. TREATMENT  Treatment of mild or moderate dehydration can often be done at home by increasing the amount of fluids that you drink. It is best to drink small amounts of fluid more often. Drinking too much at one time can make vomiting worse. Refer to the home care instructions below. Severe dehydration needs to be treated at the hospital where you will probably be given intravenous (IV) fluids that contain water and electrolytes. HOME CARE INSTRUCTIONS   Ask your caregiver about specific rehydration instructions.  Drink enough fluids to keep your urine clear or pale yellow.  Drink small amounts frequently if you have nausea and vomiting.  Eat as you normally do.  Avoid:  Foods or drinks high in sugar.  Carbonated  drinks.  Juice.  Extremely hot or cold fluids.  Drinks with caffeine.  Fatty, greasy foods.  Alcohol.  Tobacco.  Overeating.  Gelatin desserts.  Wash your hands well to avoid spreading bacteria and viruses.  Only take over-the-counter or prescription medicines for pain, discomfort, or fever as directed by your caregiver.  Ask your caregiver if you should continue all prescribed and over-the-counter medicines.  Keep all follow-up appointments with your caregiver. SEEK MEDICAL CARE IF:  You have abdominal pain and it increases or stays in one area (localizes).  You have a rash, stiff neck, or severe headache.  You are irritable, sleepy, or difficult to awaken.  You are weak, dizzy, or extremely thirsty. SEEK IMMEDIATE MEDICAL CARE IF:   You are unable to keep fluids down or you get worse despite treatment.  You have frequent episodes of vomiting or diarrhea.  You have blood or green matter (bile) in your vomit.  You have blood in your stool or your stool looks black and tarry.  You have not urinated in 6 to 8 hours, or you have only urinated a small amount of very dark urine.  You have a fever.  You faint. MAKE SURE YOU:   Understand these instructions.  Will watch your condition.  Will get help right away if you are not doing well or get worse. Document Released: 05/20/2005 Document Revised: 08/12/2011 Document Reviewed: 01/07/2011 ExitCare Patient Information 2015 [employer]. This information is not intended to replace advice given to you by your health care provider. Make sure you discuss any questions you have with your health care   provider.  

## 2014-01-13 ENCOUNTER — Ambulatory Visit (HOSPITAL_BASED_OUTPATIENT_CLINIC_OR_DEPARTMENT_OTHER): Payer: Commercial Managed Care - HMO

## 2014-01-13 VITALS — BP 93/45 | HR 74 | Temp 98.4°F | Resp 18

## 2014-01-13 DIAGNOSIS — C50211 Malignant neoplasm of upper-inner quadrant of right female breast: Secondary | ICD-10-CM

## 2014-01-13 DIAGNOSIS — D649 Anemia, unspecified: Secondary | ICD-10-CM

## 2014-01-13 DIAGNOSIS — C50919 Malignant neoplasm of unspecified site of unspecified female breast: Secondary | ICD-10-CM

## 2014-01-13 MED ORDER — SODIUM CHLORIDE 0.9 % IJ SOLN
10.0000 mL | INTRAMUSCULAR | Status: DC | PRN
  Administered 2014-01-13: 10 mL
  Filled 2014-01-13: qty 10

## 2014-01-13 MED ORDER — SODIUM CHLORIDE 0.9 % IV SOLN
Freq: Once | INTRAVENOUS | Status: AC
  Administered 2014-01-13: 13:00:00 via INTRAVENOUS

## 2014-01-13 MED ORDER — HEPARIN SOD (PORK) LOCK FLUSH 100 UNIT/ML IV SOLN
500.0000 [IU] | Freq: Once | INTRAVENOUS | Status: AC | PRN
  Administered 2014-01-13: 500 [IU]
  Filled 2014-01-13: qty 5

## 2014-01-13 NOTE — Patient Instructions (Signed)
Dehydration, Adult Dehydration is when you lose more fluids from the body than you take in. Vital organs like the kidneys, brain, and heart cannot function without a proper amount of fluids and salt. Any loss of fluids from the body can cause dehydration.  CAUSES   Vomiting.  Diarrhea.  Excessive sweating.  Excessive urine output.  Fever. SYMPTOMS  Mild dehydration  Thirst.  Dry lips.  Slightly dry mouth. Moderate dehydration  Very dry mouth.  Sunken eyes.  Skin does not bounce back quickly when lightly pinched and released.  Dark urine and decreased urine production.  Decreased tear production.  Headache. Severe dehydration  Very dry mouth.  Extreme thirst.  Rapid, weak pulse (more than 100 beats per minute at rest).  Cold hands and feet.  Not able to sweat in spite of heat and temperature.  Rapid breathing.  Blue lips.  Confusion and lethargy.  Difficulty being awakened.  Minimal urine production.  No tears. DIAGNOSIS  Your caregiver will diagnose dehydration based on your symptoms and your exam. Blood and urine tests will help confirm the diagnosis. The diagnostic evaluation should also identify the cause of dehydration. TREATMENT  Treatment of mild or moderate dehydration can often be done at home by increasing the amount of fluids that you drink. It is best to drink small amounts of fluid more often. Drinking too much at one time can make vomiting worse. Refer to the home care instructions below. Severe dehydration needs to be treated at the hospital where you will probably be given intravenous (IV) fluids that contain water and electrolytes. HOME CARE INSTRUCTIONS   Ask your caregiver about specific rehydration instructions.  Drink enough fluids to keep your urine clear or pale yellow.  Drink small amounts frequently if you have nausea and vomiting.  Eat as you normally do.  Avoid:  Foods or drinks high in sugar.  Carbonated  drinks.  Juice.  Extremely hot or cold fluids.  Drinks with caffeine.  Fatty, greasy foods.  Alcohol.  Tobacco.  Overeating.  Gelatin desserts.  Wash your hands well to avoid spreading bacteria and viruses.  Only take over-the-counter or prescription medicines for pain, discomfort, or fever as directed by your caregiver.  Ask your caregiver if you should continue all prescribed and over-the-counter medicines.  Keep all follow-up appointments with your caregiver. SEEK MEDICAL CARE IF:  You have abdominal pain and it increases or stays in one area (localizes).  You have a rash, stiff neck, or severe headache.  You are irritable, sleepy, or difficult to awaken.  You are weak, dizzy, or extremely thirsty. SEEK IMMEDIATE MEDICAL CARE IF:   You are unable to keep fluids down or you get worse despite treatment.  You have frequent episodes of vomiting or diarrhea.  You have blood or green matter (bile) in your vomit.  You have blood in your stool or your stool looks black and tarry.  You have not urinated in 6 to 8 hours, or you have only urinated a small amount of very dark urine.  You have a fever.  You faint. MAKE SURE YOU:   Understand these instructions.  Will watch your condition.  Will get help right away if you are not doing well or get worse. Document Released: 05/20/2005 Document Revised: 08/12/2011 Document Reviewed: 01/07/2011 ExitCare Patient Information 2015 [employer]. This information is not intended to replace advice given to you by your health care provider. Make sure you discuss any questions you have with your health care   provider.  

## 2014-01-13 NOTE — Progress Notes (Signed)
Pt states she does not need antiemetics at this time.

## 2014-01-17 ENCOUNTER — Encounter: Payer: Self-pay | Admitting: Adult Health

## 2014-01-17 ENCOUNTER — Ambulatory Visit (HOSPITAL_BASED_OUTPATIENT_CLINIC_OR_DEPARTMENT_OTHER): Payer: Commercial Managed Care - HMO

## 2014-01-17 ENCOUNTER — Other Ambulatory Visit: Payer: Self-pay | Admitting: *Deleted

## 2014-01-17 ENCOUNTER — Telehealth: Payer: Self-pay | Admitting: *Deleted

## 2014-01-17 ENCOUNTER — Other Ambulatory Visit (HOSPITAL_BASED_OUTPATIENT_CLINIC_OR_DEPARTMENT_OTHER): Payer: Commercial Managed Care - HMO

## 2014-01-17 ENCOUNTER — Ambulatory Visit (HOSPITAL_BASED_OUTPATIENT_CLINIC_OR_DEPARTMENT_OTHER): Payer: Commercial Managed Care - HMO | Admitting: Adult Health

## 2014-01-17 VITALS — BP 90/45 | HR 73 | Temp 97.4°F | Resp 18

## 2014-01-17 VITALS — BP 82/58 | HR 72 | Temp 98.0°F | Resp 20 | Ht 66.0 in | Wt 152.3 lb

## 2014-01-17 DIAGNOSIS — C50211 Malignant neoplasm of upper-inner quadrant of right female breast: Secondary | ICD-10-CM

## 2014-01-17 DIAGNOSIS — C50919 Malignant neoplasm of unspecified site of unspecified female breast: Secondary | ICD-10-CM

## 2014-01-17 DIAGNOSIS — C50911 Malignant neoplasm of unspecified site of right female breast: Secondary | ICD-10-CM

## 2014-01-17 DIAGNOSIS — I951 Orthostatic hypotension: Secondary | ICD-10-CM

## 2014-01-17 DIAGNOSIS — Z171 Estrogen receptor negative status [ER-]: Secondary | ICD-10-CM

## 2014-01-17 DIAGNOSIS — D702 Other drug-induced agranulocytosis: Secondary | ICD-10-CM

## 2014-01-17 DIAGNOSIS — R42 Dizziness and giddiness: Secondary | ICD-10-CM

## 2014-01-17 LAB — URINALYSIS, MICROSCOPIC - CHCC
BILIRUBIN (URINE): NEGATIVE
Blood: NEGATIVE
Glucose: NEGATIVE mg/dL
KETONES: NEGATIVE mg/dL
LEUKOCYTE ESTERASE: NEGATIVE
Nitrite: NEGATIVE
Protein: <30 mg/dL
Specific Gravity, Urine: 1.015 (ref 1.003–1.035)
Urobilinogen, UR: 0.2 mg/dL (ref 0.2–1)
pH: 6.5 (ref 4.6–8.0)

## 2014-01-17 LAB — CBC WITH DIFFERENTIAL/PLATELET
BASO%: 1.5 % (ref 0.0–2.0)
Basophils Absolute: 0 10*3/uL (ref 0.0–0.1)
EOS%: 3 % (ref 0.0–7.0)
Eosinophils Absolute: 0 10*3/uL (ref 0.0–0.5)
HEMATOCRIT: 30.5 % — AB (ref 34.8–46.6)
HGB: 10.2 g/dL — ABNORMAL LOW (ref 11.6–15.9)
LYMPH%: 59.7 % — AB (ref 14.0–49.7)
MCH: 33.6 pg (ref 25.1–34.0)
MCHC: 33.4 g/dL (ref 31.5–36.0)
MCV: 100.3 fL (ref 79.5–101.0)
MONO#: 0 10*3/uL — ABNORMAL LOW (ref 0.1–0.9)
MONO%: 1.5 % (ref 0.0–14.0)
NEUT#: 0.2 10*3/uL — CL (ref 1.5–6.5)
NEUT%: 34.3 % — AB (ref 38.4–76.8)
PLATELETS: 43 10*3/uL — AB (ref 145–400)
RBC: 3.04 10*6/uL — AB (ref 3.70–5.45)
RDW: 14.2 % (ref 11.2–14.5)
WBC: 0.7 10*3/uL — AB (ref 3.9–10.3)
lymph#: 0.4 10*3/uL — ABNORMAL LOW (ref 0.9–3.3)
nRBC: 0 % (ref 0–0)

## 2014-01-17 LAB — COMPREHENSIVE METABOLIC PANEL (CC13)
ALT: 51 U/L (ref 0–55)
AST: 23 U/L (ref 5–34)
Albumin: 3.4 g/dL — ABNORMAL LOW (ref 3.5–5.0)
Alkaline Phosphatase: 78 U/L (ref 40–150)
Anion Gap: 8 mEq/L (ref 3–11)
BILIRUBIN TOTAL: 0.62 mg/dL (ref 0.20–1.20)
BUN: 15.2 mg/dL (ref 7.0–26.0)
CALCIUM: 9.2 mg/dL (ref 8.4–10.4)
CHLORIDE: 102 meq/L (ref 98–109)
CO2: 28 meq/L (ref 22–29)
CREATININE: 0.7 mg/dL (ref 0.6–1.1)
Glucose: 109 mg/dl (ref 70–140)
Potassium: 3.6 mEq/L (ref 3.5–5.1)
Sodium: 138 mEq/L (ref 136–145)
Total Protein: 6.1 g/dL — ABNORMAL LOW (ref 6.4–8.3)

## 2014-01-17 MED ORDER — HEPARIN SOD (PORK) LOCK FLUSH 100 UNIT/ML IV SOLN
500.0000 [IU] | Freq: Once | INTRAVENOUS | Status: AC
  Administered 2014-01-17: 500 [IU] via INTRAVENOUS
  Filled 2014-01-17: qty 5

## 2014-01-17 MED ORDER — SODIUM CHLORIDE 0.9 % IJ SOLN
10.0000 mL | INTRAMUSCULAR | Status: DC | PRN
  Administered 2014-01-17: 10 mL via INTRAVENOUS
  Filled 2014-01-17: qty 10

## 2014-01-17 MED ORDER — SODIUM CHLORIDE 0.9 % IV SOLN
1000.0000 mL | INTRAVENOUS | Status: DC
  Administered 2014-01-17: 1000 mL via INTRAVENOUS

## 2014-01-17 MED ORDER — CIPROFLOXACIN HCL 500 MG PO TABS: 500.0000 mg | ORAL_TABLET | Freq: Two times a day (BID) | ORAL | Status: DC

## 2014-01-17 NOTE — Progress Notes (Signed)
ID: Wendy [name] OB: Aug 11, 1945  MR#: [id number]  JQB#:[id number]  PCP: PROVIDER NOT IN SYSTEM GYN:   SU: Dr. Dalbert Batman OTHER MD:  CHIEF COMPLAINT:[place] woman with T1 N0, stage IA, invasive ductal carcinoma, grade 2, ER negative, PR negative, Ki-67 of 13%, HER-2/neu negative.   BREAST CANCER HISTORY: Patient went for an annual mammogram and she was found to have an abnormality in the right breast at the 3:00 position. Subsequently had a right diagnostic mammogram performed. She also had a image guided biopsy of the right breast at the 3:00 position. 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 The tumor was ER negative PR negative HER-2/neu negative with a proliferation marker Ki-67 13%. All of her workup initially was performed in [place]. She subsequently was referred to Dr. Fanny Skates who saw her on 08/04/2003. He has recommended MRI of the breasts. This was performed on 08/09/2013. In the right breast there was an irregular enhancing mass in the 3:00 region measuring 1.5 x 1.0 x 1.4 cm. Also noted was nodular segmental enhancement extending 6.1 cm posteriorly from the mass towards the chest wall. The mass and nodular enhancement measured 7 cm. In the middle third of the lower outer quadrant of the right breast there was a 1.0 x 0.7 x 0.7 cm area of focal non-masslike enhancement. Left breast revealed no masses or abnormal enhancements. Lymph nodes appeared normal.  CURRENT THERAPY: Fluorouracil, Epirubicin, Cyclophosphamide cycle 6 day 8  INTERVAL HISTORY:  Wendy [name] is here today accompanied by her husband for evaluation after receiving her sixth cycle of FEC chemotherapy.  It is given on day 1 of a 21 day cycle with Neulasta given on day 2 for granulocyte support.    She is doing moderately well today.  She does feel dizzy.  Her blood pressure is low.  She is eating moderately well.  She is fatigued.  She cannot quantify how much she is drinking, but  she does tell me that she is urinating every 2 hours.  She is drinking 3 protein drinks per day.  Otherwise, she denies fevers, chills, nausea, vomiting, constipation, diarrhea.    REVIEW OF SYSTEMS: A 10 point review of systems was conducted and is otherwise negative except for what is noted above.    PAST MEDICAL HISTORY: Past Medical History  Diagnosis Date  . Family history of anesthesia complication     "daughter PONV; long time in recovery" (09/07/2013)  . Asthma     as a child  . Asthmatic bronchitis     "about q yr" (09/07/2013)  . Pneumonia     "several times; usually follows asthmatic bronchitis" (09/07/2013)  . Sinus headache     "maybe 2-3 times/month"  . Breast cancer     "right breast"     PAST SURGICAL HISTORY: Past Surgical History  Procedure Laterality Date  . Tubal ligation  1977  . Finger fracture surgery Left 2010    "middle finger spiral fx; little finger broken; tissue damage"  . Tonsillectomy    . Portacath placement Right 09/07/2013  . Mastectomy complete / simple w/ sentinel node biopsy Right 09/07/2013  . Mastectomy complete / simple  09/07/2013  . Breast biopsy Right 07/2013  . Dilation and curettage of uterus      S/P miscarriage  . Wrist fracture surgery Left 2010  . Mastectomy w/ sentinel node biopsy Bilateral 09/07/2013    Procedure: BILATERAL MASTECTOMY WITH RIGHT SENTINEL LYMPH NODE BIOPSY;  Surgeon: Edsel Petrin  Dalbert Batman, MD;  Location: [facility];  Service: General;  Laterality: Bilateral;  . Portacath placement Right 09/07/2013    Procedure: INSERTION PORT-A-CATH;  Surgeon: [name], MD;  Location: [facility];  Service: General;  Laterality: Right;    FAMILY HISTORY Family History  Problem Relation Age of Onset  . Bladder Cancer Sister 87    worked in [employer], around 2nd hand smoke all her life  . Diabetes Sister   . Colon cancer Brother 62    non-smoker  . Diabetes Brother   . Colon cancer Maternal Uncle 47  . Osteoporosis Mother   . Parkinson's  disease Mother   . Diabetes Mother   . Rheum arthritis Father   . Diabetes Father   . Breast cancer Paternal Aunt     dx over 52  . Diabetes Maternal Grandmother   . Diabetes Maternal Grandfather   . Uterine cancer Other 50  . Colon cancer Maternal Aunt     dx in her 32s-70s  . Breast cancer Cousin     paternal cousin dx in her 52s-70s    GYNECOLOGIC HISTORY: menarche at age 28, G94, P2, patient took HRT for 4-5 years [name] menopause without problems, no h/o abnormal pap smears or sexually transmitted infections.    SOCIAL HISTORY:  Lives with husband of 28 years.  Patient is retired.  No tobacco, ETOH, illicit drug use.    ADVANCED DIRECTIVES:  In place, HCPOA husband Wendy [name] who can be reached at [phone].     HEALTH MAINTENANCE: History  Substance Use Topics  . Smoking status: Never Smoker   . Smokeless tobacco: Never Used  . Alcohol Use: No      Mammogram: 07/2013 Colonoscopy: 2013 Bone Density Scan: 08/2013 Pap Smear: 2013 Eye Exam: 2012 Vitamin D Level: unknown Lipid Panel: patient unsure likely 3-4 years ago   No Known Allergies  Current Outpatient Prescriptions  Medication Sig Dispense Refill  . Alum & Mag Hydroxide-Simeth (MAGIC MOUTHWASH) SOLN SWISH AND spit ONE TEASPOONFUL BY MOUTH FOUR TIMES DAILY AS NEEDED.  240 mL  1  . b complex vitamins tablet Take 1 tablet by mouth daily.      . Calcium Carbonate-Vitamin D (CALCIUM PLUS VITAMIN D PO) Take 1 tablet by mouth 2 (two) times daily.      Marland Kitchen CHERATUSSIN AC 100-10 MG/5ML syrup       . ciprofloxacin (CIPRO) 500 MG tablet Take 1 tablet (500 mg total) by mouth 2 (two) times daily.  14 tablet  0  . dexamethasone (DECADRON) 4 MG tablet Take 2 tablets by mouth once a day on the day after chemotherapy and then take 2 tablets two times a day for 2 days. Take with food.  30 tablet  1  . lidocaine (XYLOCAINE) 2 % solution Use as directed 5 mLs in the mouth or throat 4 (four) times daily as needed for mouth pain (use  with current supply of MMW).  120 mL  1  . lidocaine-prilocaine (EMLA) cream Apply 1 application topically as needed.  30 g  1  . LORazepam (ATIVAN) 0.5 MG tablet TAKE ONE TABLET EVERY 6 HOURS AS NEEDED FOR NAUSEA AND VOMITING  30 tablet  0  . omeprazole (PRILOSEC) 40 MG capsule Take 1 capsule (40 mg total) by mouth daily.  30 capsule  5  . ondansetron (ZOFRAN) 8 MG tablet TAKE ONE TABLET TWICE DAILY AS NEEDED *START ON THE 3RD DAY AFTER CHEMOTHERAPY*  30 tablet  1  . prochlorperazine (  COMPAZINE) 10 MG tablet TAKE ONE TABLET EVERY 6 HOURS AS NEEDED FOR NAUSEA AND VOMITING  30 tablet  1  . UNABLE TO FIND [id number] [name] mastectomy camisole QTY: 2  Diagnosis Code 174.9  2 each  3  . venlafaxine (EFFEXOR) 37.5 MG tablet Take 1 tablet (37.5 mg total) by mouth 2 (two) times daily.  60 tablet  0   No current facility-administered medications for this visit.   Facility-Administered Medications Ordered in Other Visits  Medication Dose Route Frequency Provider Last Rate Last Dose  . heparin lock flush 100 unit/mL  500 Units Intravenous Once Lennis P Livesay, MD      . sodium chloride 0.9 % injection 10 mL  10 mL Intravenous PRN Lennis Marion Downer, MD        OBJECTIVE: Filed Vitals:   01/17/14 1201  BP: 82/58  Pulse:   Temp:   Resp:      Body mass index is 24.59 kg/(m^2).     GENERAL: Patient is well appearing and in no acute distress.  HEENT:  Sclerae anicteric.  Oropharynx clear and moist. No ulcerations or evidence of oropharyngeal candidiasis. Neck is supple.  NODES:  No cervical, supraclavicular, or axillary lymphadenopathy palpated.  BREAST EXAM:  Deferred. LUNGS:  Clear to auscultation bilaterally.  No wheezes or rhonchi. HEART:  Regular rate and rhythm. No murmur appreciated. ABDOMEN:  Soft, nontender.  Positive, normoactive bowel sounds. No organomegaly palpated. MSK:  No focal spinal tenderness to palpation. Full range of motion bilaterally in the upper extremities. EXTREMITIES:  No  peripheral edema.   SKIN:  Clear with no obvious rashes or skin changes. No nail dyscrasia. NEURO:  Nonfocal. Well oriented.  Appropriate affect. ECOG FS:2  LAB RESULTS:  CMP     Component Value Date/Time   NA 138 01/17/2014 1107   NA 143 01/10/2014 1045   K 3.6 01/17/2014 1107   K 4.0 01/10/2014 1045   CL 105 01/10/2014 1045   CO2 28 01/17/2014 1107   CO2 27 01/10/2014 1045   GLUCOSE 109 01/17/2014 1107   GLUCOSE 94 01/10/2014 1045   BUN 15.2 01/17/2014 1107   BUN 13 01/10/2014 1045   CREATININE 0.7 01/17/2014 1107   CREATININE 0.82 01/10/2014 1045   CALCIUM 9.2 01/17/2014 1107   CALCIUM 9.4 01/10/2014 1045   PROT 6.1* 01/17/2014 1107   PROT 6.7 01/10/2014 1045   ALBUMIN 3.4* 01/17/2014 1107   ALBUMIN 3.5 01/10/2014 1045   AST 23 01/17/2014 1107   AST 31 01/10/2014 1045   ALT 51 01/17/2014 1107   ALT 35 01/10/2014 1045   ALKPHOS 78 01/17/2014 1107   ALKPHOS 68 01/10/2014 1045   BILITOT 0.62 01/17/2014 1107   BILITOT <0.2* 01/10/2014 1045   GFRNONAA 84* 09/07/2013 1652   GFRAA >90 09/07/2013 1652    I No results found for this basename: SPEP,  UPEP,   kappa and lambda light chains    Lab Results  Component Value Date   WBC 0.7* 01/17/2014   NEUTROABS 0.2* 01/17/2014   HGB 10.2* 01/17/2014   HCT 30.5* 01/17/2014   MCV 100.3 01/17/2014   PLT 43* 01/17/2014      Chemistry      Component Value Date/Time   NA 138 01/17/2014 1107   NA 143 01/10/2014 1045   K 3.6 01/17/2014 1107   K 4.0 01/10/2014 1045   CL 105 01/10/2014 1045   CO2 28 01/17/2014 1107   CO2 27 01/10/2014 1045   BUN 15.2  01/17/2014 1107   BUN 13 01/10/2014 1045   CREATININE 0.7 01/17/2014 1107   CREATININE 0.82 01/10/2014 1045      Component Value Date/Time   CALCIUM 9.2 01/17/2014 1107   CALCIUM 9.4 01/10/2014 1045   ALKPHOS 78 01/17/2014 1107   ALKPHOS 68 01/10/2014 1045   AST 23 01/17/2014 1107   AST 31 01/10/2014 1045   ALT 51 01/17/2014 1107   ALT 35 01/10/2014 1045   BILITOT 0.62 01/17/2014 1107   BILITOT <0.2* 01/10/2014 1045        No results found for this basename: LABCA2    No components found with this basename: LABCA125    No results found for this basename: INR,  in the last 168 hours  Urinalysis    Component Value Date/Time   COLORURINE YELLOW 08/31/2013 [place] 08/31/2013 1018   LABSPEC 1.015 01/17/2014 1240   LABSPEC 1.007 08/31/2013 1018   PHURINE 6.5 08/31/2013 1018   GLUCOSEU Negative 01/17/2014 1240   GLUCOSEU NEGATIVE 08/31/2013 1018   HGBUR NEGATIVE 08/31/2013 1018   BILIRUBINUR NEGATIVE 08/31/2013 1018   KETONESUR NEGATIVE 08/31/2013 1018   PROTEINUR NEGATIVE 08/31/2013 1018   UROBILINOGEN 0.2 01/17/2014 1240   UROBILINOGEN 0.2 08/31/2013 1018   NITRITE NEGATIVE 08/31/2013 1018   LEUKOCYTESUR NEGATIVE 08/31/2013 1018    STUDIES: No results found.  ASSESSMENT: 68 y.o. [place] woman with T1 N0, stage IA, invasive ductal carcinoma, grade 2, ER negative, PR negative, Ki-67 of 13%, HER-2/neu negative.    1. The patient underwent bilateral mastectomy by Dr. Dalbert Batman on 09/07/2013, and a right port-a-cath was also placed at that time. Pathology revealed a grade 2, 1.7 cm invasive ductal carcinoma. Two lymph nodes were negative for malignancy. The tumor was ER negative, PR negative, HER-2/neu negative. Ki-67 was 13%.   2. Adjuvant Fluorouracil, Epirubicin, and Cyclophosphamide given on day 1 of a 21 day cycle with Neulasta given as granulocyte support was started on 09/27/13. A total of 6 cycles are planned.  3. As outlined by the 08/10/13 note from Dr Humphrey Rolls, the patient will then receive Carboplatin and Taxol weekly for 12 weeks.   PLAN:   Sammi is doing moderately well today.  She is dizzy and hypotensive today.  This is likely due to decreased PO intake from chemotherapy.  She is orthostatic today.  She denies fevers, chills, any outward sign of infection.  She is subsequently neutropenic from chemotherapy.  I reviewed detailed neutropenic instructions with her and gave her a copy of  these in her AVS.  She will start Cipro 547m PO BID tonight.  Due to her orthostatic hypotension, she will receive IV fluids today.  Since she is neutropenic as well, we will draw blood cultures and a urinalysis/cultures. She will return tomorrow and Thursday for additional IV hydration and on 8/31 for labs, evaluation by Dr. GLindi Adie and to start weekly Taxol/Carbo.  I again reviewed the chemotherapy plan with the patient and her husband along with neuropathy.      She knows to call [facility] the interim for any questions or concerns.  We can certainly see her sooner if needed.  The above was reviewed with Dr. GLindi Adietoday in detail.  I spent 40 minutes counseling the patient face to face.  The total time spent in the appointment was 50 minutes.  LMinette Headland [phone]/17/2015 2:28 PM

## 2014-01-17 NOTE — Progress Notes (Signed)
Mendel Ryder, NP notified of pt's BP 90/45 after IVF and pt asymptomatic at this time. Okay to discharge home.

## 2014-01-17 NOTE — Telephone Encounter (Signed)
Per APP and POF I have scheduled appts. Patient will get schedule with AVS   JMW

## 2014-01-17 NOTE — Patient Instructions (Signed)
Patient Neutropenia Instruction Sheet  Diagnosis: Breast Cancer      Treating Physician: Dr. Lindi Adie  Treatment: 1. Type of chemotherapy: FEC 2. Date of last treatment: 01/10/14  Last Blood Counts: Lab Results  Component Value Date   WBC 0.7* 01/17/2014   HGB 10.2* 01/17/2014   HCT 30.5* 01/17/2014   MCV 100.3 01/17/2014   PLT 43* 01/17/2014  ANC 200     Prophylactic Antibiotics: Cipro 500 mg by mouth twice a day Instructions: 1. Monitor temperature and call if fever  greater than 100.5, chills, shaking chills (rigors) 2. Call Physician on-call at [phone] 3. Give him/her symptoms and list of medications that you are taking and your last blood count.  Ciprofloxacin tablets What is this medicine? CIPROFLOXACIN (sip roe FLOX a sin) is a quinolone antibiotic. It is used to treat certain kinds of bacterial infections. It will not work for colds, flu, or other viral infections. This medicine may be used for other purposes; ask your health care provider or pharmacist if you have questions. COMMON BRAND NAME(S): Cipro What should I tell my health care provider before I take this medicine? They need to know if you have any of these conditions: -bone problems -cerebral disease -joint problems -irregular heartbeat -kidney disease -liver disease -myasthenia gravis -seizure disorder -tendon problems -an unusual or allergic reaction to ciprofloxacin, other antibiotics or medicines, foods, dyes, or preservatives -pregnant or trying to get pregnant -breast-feeding How should I use this medicine? Take this medicine by mouth with a glass of water. Follow the directions on the prescription label. Take your medicine at regular intervals. Do not take your medicine more often than directed. Take all of your medicine as directed even if you think your are better. Do not skip doses or stop your medicine early. You can take this medicine with food or on an empty stomach. It can be taken with a  meal that contains dairy or calcium, but do not take it alone with a dairy product, like milk or yogurt or calcium-fortified juice. A special MedGuide will be given to you by the pharmacist with each prescription and refill. Be sure to read this information carefully each time. Talk to your pediatrician regarding the use of this medicine in children. Special care may be needed. Overdosage: If you think you have taken too much of this medicine contact a poison control center or emergency room at once. NOTE: This medicine is only for you. Do not share this medicine with others. What if I miss a dose? If you miss a dose, take it as soon as you can. If it is almost time for your next dose, take only that dose. Do not take double or extra doses. What may interact with this medicine? Do not take this medicine with any of the following medications: -cisapride -droperidol -terfenadine -tizanidine This medicine may also interact with the following medications: -antacids -birth control pills -caffeine -cyclosporin -didanosine (ddI) buffered tablets or powder -medicines for diabetes -medicines for inflammation like ibuprofen, naproxen -methotrexate -multivitamins -omeprazole -phenytoin -probenecid -sucralfate -theophylline -warfarin This list may not describe all possible interactions. Give your health care provider a list of all the medicines, herbs, non-prescription drugs, or dietary supplements you use. Also tell them if you smoke, drink alcohol, or use illegal drugs. Some items may interact with your medicine. What should I watch for while using this medicine? Tell your doctor or health care professional if your symptoms do not improve. Do not treat diarrhea with over the counter  products. Contact your doctor if you have diarrhea that lasts more than 2 days or if it is severe and watery. You may get drowsy or dizzy. Do not drive, use machinery, or do anything that needs mental alertness  until you know how this medicine affects you. Do not stand or sit up quickly, especially if you are an older patient. This reduces the risk of dizzy or fainting spells. This medicine can make you more sensitive to the sun. Keep out of the sun. If you cannot avoid being in the sun, wear protective clothing and use sunscreen. Do not use sun lamps or tanning beds/booths. Avoid antacids, aluminum, calcium, iron, magnesium, and zinc products for 6 hours before and 2 hours after taking a dose of this medicine. What side effects may I notice from receiving this medicine? Side effects that you should report to your doctor or health care professional as soon as possible: - allergic reactions like skin rash, itching or hives, swelling of the face, lips, or tongue - breathing problems - confusion, nightmares or hallucinations - feeling faint or lightheaded, falls - irregular heartbeat - joint, muscle or tendon pain or swelling - pain or trouble passing urine -persistent headache with or without blurred vision - redness, blistering, peeling or loosening of the skin, including inside the mouth - seizure - unusual pain, numbness, tingling, or weakness Side effects that usually do not require medical attention (report to your doctor or health care professional if they continue or are bothersome): - diarrhea - nausea or stomach upset - white patches or sores in the mouth This list may not describe all possible side effects. Call your doctor for medical advice about side effects. You may report side effects to FDA at 1-800-FDA-1088. Where should I keep my medicine? Keep out of the reach of children. Store at room temperature below 30 degrees C (86 degrees F). Keep container tightly closed. Throw away any unused medicine after the expiration date. NOTE: This sheet is a summary. It may not cover all possible information. If you have questions about this medicine, talk to your doctor, pharmacist, or  health care provider.  2015, [employer] Standard. (2012-12-24 16:10:46)

## 2014-01-17 NOTE — Patient Instructions (Addendum)
Neutropenia Neutropenia is a condition that occurs when the level of a certain type of white blood cell (neutrophil) in your body becomes lower than normal. Neutrophils are made in the bone marrow and fight infections. These cells protect against bacteria and viruses. The fewer neutrophils you have, and the longer your body remains without them, the greater your risk of getting a severe infection becomes. CAUSES  The cause of neutropenia may be hard to determine. However, it is usually due to 3 main problems:   Decreased production of neutrophils. This may be due to:  Certain medicines such as chemotherapy.  Genetic problems.  Cancer.  Radiation treatments.  Vitamin deficiency.  Some pesticides.  Increased destruction of neutrophils. This may be due to:  Overwhelming infections.  Hemolytic anemia. This is when the body destroys its own blood cells.  Chemotherapy.  Neutrophils moving to areas of the body where they cannot fight infections. This may be due to:  Dialysis procedures.  Conditions where the spleen becomes enlarged. Neutrophils are held in the spleen and are not available to the rest of the body.  Overwhelming infections. The neutrophils are held in the area of the infection and are not available to the rest of the body. SYMPTOMS  There are no specific symptoms of neutropenia. The lack of neutrophils can result in an infection, and an infection can cause various problems. DIAGNOSIS  Diagnosis is made by a blood test. A complete blood count is performed. The normal level of neutrophils in human blood differs with age and race. Infants have lower counts than older children and adults. African Americans have lower counts than Caucasians or Asians. The average adult level is 1500 cells/mm3 of blood. Neutrophil counts are interpreted as follows:  Greater than 1000 cells/mm3 gives normal protection against infection.  500 to 1000 cells/mm3 gives an increased risk for  infection.  200 to 500 cells/mm3 is a greater risk for severe infection.  Lower than 200 cells/mm3 is a marked risk of infection. This may require hospitalization and treatment with antibiotic medicines. TREATMENT  Treatment depends on the underlying cause, severity, and presence of infections or symptoms. It also depends on your health. Your caregiver will discuss the treatment plan with you. Mild cases are often easily treated and have a good outcome. Preventative measures may also be started to limit your risk of infections. Treatment can include:  Taking antibiotics.  Stopping medicines that are known to cause neutropenia.  Correcting nutritional deficiencies by eating green vegetables to supply folic acid and taking vitamin B supplements.  Stopping exposure to pesticides if your neutropenia is related to pesticide exposure.  Taking a blood growth factor called sargramostim, pegfilgrastim, or filgrastim if you are undergoing chemotherapy for cancer. This stimulates white blood cell production.  Removal of the spleen if you have Felty's syndrome and have repeated infections. HOME CARE INSTRUCTIONS   Follow your caregiver's instructions about when you need to have blood work done.  Wash your hands often. Make sure others who come in contact with you also wash their hands.  Wash raw fruits and vegetables before eating them. They can carry bacteria and fungi.  Avoid people with colds or spreadable (contagious) diseases (chickenpox, herpes zoster, influenza).  Avoid large crowds.  Avoid construction areas. The dust can release fungus into the air.  Be cautious around children in daycare or school environments.  Take care of your respiratory system by coughing and deep breathing.  Bathe daily.  Protect your skin from cuts and   burns.  Do not work in the garden or with flowers and plants.  Care for the mouth before and after meals by brushing with a soft toothbrush. If you have  mucositis, do not use mouthwash. Mouthwash contains alcohol and can dry out the mouth even more.  Clean the area between the genitals and the anus (perineal area) after urination and bowel movements. Women need to wipe from front to back.  Use a water soluble lubricant during sexual intercourse and practice good hygiene after. Do not have intercourse if you are severely neutropenic. Check with your caregiver for guidelines.  Exercise daily as tolerated.  Avoid people who were vaccinated with a live vaccine in the past 30 days. You should not receive live vaccines (polio, typhoid).  Do not provide direct care for pets. Avoid animal droppings. Do not clean litter boxes and bird cages.  Do not share food utensils.  Do not use tampons, enemas, or rectal suppositories unless directed by your caregiver.  Use an electric razor to remove hair.  Wash your hands after handling magazines, letters, and newspapers. SEEK IMMEDIATE MEDICAL CARE IF:   You have a fever.  You have chills or start to shake.  You feel nauseous or vomit.  You develop mouth sores.  You develop aches and pains.  You have redness and swelling around open wounds.  Your skin is warm to the touch.  You have pus coming from your wounds.  You develop swollen lymph nodes.  You feel weak or fatigued.  You develop red streaks on the skin. MAKE SURE YOU:  Understand these instructions.  Will watch your condition.  Will get help right away if you are not doing well or get worse. Document Released: 11/09/2001 Document Revised: 08/12/2011 Document Reviewed: 12/07/2010 Exit[employer] Patient Information 2015 [employer]. This information is not intended to replace advice given to you by your health care provider. Make sure you discuss any questions you have with your health care provider. Dehydration, Adult Dehydration is when you lose more fluids from the body than you take in. Vital organs like the kidneys, brain, and  heart cannot function without a proper amount of fluids and salt. Any loss of fluids from the body can cause dehydration.  CAUSES   Vomiting.  Diarrhea.  Excessive sweating.  Excessive urine output.  Fever. SYMPTOMS  Mild dehydration  Thirst.  Dry lips.  Slightly dry mouth. Moderate dehydration  Very dry mouth.  Sunken eyes.  Skin does not bounce back quickly when lightly pinched and released.  Dark urine and decreased urine production.  Decreased tear production.  Headache. Severe dehydration  Very dry mouth.  Extreme thirst.  Rapid, weak pulse (more than 100 beats per minute at rest).  Cold hands and feet.  Not able to sweat in spite of heat and temperature.  Rapid breathing.  Blue lips.  Confusion and lethargy.  Difficulty being awakened.  Minimal urine production.  No tears. DIAGNOSIS  Your caregiver will diagnose dehydration based on your symptoms and your exam. Blood and urine tests will help confirm the diagnosis. The diagnostic evaluation should also identify the cause of dehydration. TREATMENT  Treatment of mild or moderate dehydration can often be done at home by increasing the amount of fluids that you drink. It is best to drink small amounts of fluid more often. Drinking too much at one time can make vomiting worse. Refer to the home care instructions below. Severe dehydration needs to be treated at the hospital where you   will probably be given intravenous (IV) fluids that contain water and electrolytes. HOME CARE INSTRUCTIONS   Ask your caregiver about specific rehydration instructions.  Drink enough fluids to keep your urine clear or pale yellow.  Drink small amounts frequently if you have nausea and vomiting.  Eat as you normally do.  Avoid:  Foods or drinks high in sugar.  Carbonated drinks.  Juice.  Extremely hot or cold fluids.  Drinks with caffeine.  Fatty, greasy  foods.  Alcohol.  Tobacco.  Overeating.  Gelatin desserts.  Wash your hands well to avoid spreading bacteria and viruses.  Only take over-the-counter or prescription medicines for pain, discomfort, or fever as directed by your caregiver.  Ask your caregiver if you should continue all prescribed and over-the-counter medicines.  Keep all follow-up appointments with your caregiver. SEEK MEDICAL CARE IF:  You have abdominal pain and it increases or stays in one area (localizes).  You have a rash, stiff neck, or severe headache.  You are irritable, sleepy, or difficult to awaken.  You are weak, dizzy, or extremely thirsty. SEEK IMMEDIATE MEDICAL CARE IF:   You are unable to keep fluids down or you get worse despite treatment.  You have frequent episodes of vomiting or diarrhea.  You have blood or green matter (bile) in your vomit.  You have blood in your stool or your stool looks black and tarry.  You have not urinated in 6 to 8 hours, or you have only urinated a small amount of very dark urine.  You have a fever.  You faint. MAKE SURE YOU:   Understand these instructions.  Will watch your condition.  Will get help right away if you are not doing well or get worse. Document Released: 05/20/2005 Document Revised: 08/12/2011 Document Reviewed: 01/07/2011 Exit[employer] Patient Information 2015 [employer]. This information is not intended to replace advice given to you by your health care provider. Make sure you discuss any questions you have with your health care provider.  

## 2014-01-18 ENCOUNTER — Ambulatory Visit (HOSPITAL_BASED_OUTPATIENT_CLINIC_OR_DEPARTMENT_OTHER): Payer: Commercial Managed Care - HMO

## 2014-01-18 DIAGNOSIS — I951 Orthostatic hypotension: Secondary | ICD-10-CM

## 2014-01-18 DIAGNOSIS — R42 Dizziness and giddiness: Secondary | ICD-10-CM

## 2014-01-18 DIAGNOSIS — C50211 Malignant neoplasm of upper-inner quadrant of right female breast: Secondary | ICD-10-CM

## 2014-01-18 DIAGNOSIS — C50919 Malignant neoplasm of unspecified site of unspecified female breast: Secondary | ICD-10-CM

## 2014-01-18 LAB — URINE CULTURE

## 2014-01-18 MED ORDER — SODIUM CHLORIDE 0.9 % IV SOLN
Freq: Once | INTRAVENOUS | Status: AC
  Administered 2014-01-18: 14:00:00 via INTRAVENOUS

## 2014-01-18 MED ORDER — HEPARIN SOD (PORK) LOCK FLUSH 100 UNIT/ML IV SOLN
500.0000 [IU] | Freq: Once | INTRAVENOUS | Status: AC | PRN
  Administered 2014-01-18: 500 [IU]
  Filled 2014-01-18: qty 5

## 2014-01-18 MED ORDER — SODIUM CHLORIDE 0.9 % IJ SOLN
10.0000 mL | INTRAMUSCULAR | Status: DC | PRN
  Administered 2014-01-18: 10 mL
  Filled 2014-01-18: qty 10

## 2014-01-18 NOTE — Progress Notes (Signed)
Discharged with no complaints of dizziness or nausea.  Post vital signs stable.

## 2014-01-18 NOTE — Patient Instructions (Signed)

## 2014-01-20 ENCOUNTER — Telehealth: Payer: Self-pay | Admitting: *Deleted

## 2014-01-20 ENCOUNTER — Ambulatory Visit (HOSPITAL_BASED_OUTPATIENT_CLINIC_OR_DEPARTMENT_OTHER): Payer: Commercial Managed Care - HMO

## 2014-01-20 VITALS — BP 97/47 | HR 83 | Temp 98.3°F | Resp 18

## 2014-01-20 DIAGNOSIS — C50219 Malignant neoplasm of upper-inner quadrant of unspecified female breast: Secondary | ICD-10-CM

## 2014-01-20 DIAGNOSIS — C50211 Malignant neoplasm of upper-inner quadrant of right female breast: Secondary | ICD-10-CM

## 2014-01-20 DIAGNOSIS — C50919 Malignant neoplasm of unspecified site of unspecified female breast: Secondary | ICD-10-CM

## 2014-01-20 DIAGNOSIS — Z452 Encounter for adjustment and management of vascular access device: Secondary | ICD-10-CM

## 2014-01-20 DIAGNOSIS — D709 Neutropenia, unspecified: Secondary | ICD-10-CM

## 2014-01-20 DIAGNOSIS — I951 Orthostatic hypotension: Secondary | ICD-10-CM

## 2014-01-20 DIAGNOSIS — R42 Dizziness and giddiness: Secondary | ICD-10-CM

## 2014-01-20 MED ORDER — LIDOCAINE-PRILOCAINE 2.5-2.5 % EX CREA: 1.0000 "application " | TOPICAL_CREAM | CUTANEOUS | Status: DC | PRN

## 2014-01-20 MED ORDER — HEPARIN SOD (PORK) LOCK FLUSH 100 UNIT/ML IV SOLN
500.0000 [IU] | Freq: Once | INTRAVENOUS | Status: AC | PRN
  Administered 2014-01-20: 500 [IU]
  Filled 2014-01-20: qty 5

## 2014-01-20 MED ORDER — SODIUM CHLORIDE 0.9 % IJ SOLN
10.0000 mL | INTRAMUSCULAR | Status: DC | PRN
  Administered 2014-01-20: 10 mL
  Filled 2014-01-20: qty 10

## 2014-01-20 MED ORDER — LORAZEPAM 0.5 MG PO TABS: ORAL_TABLET | ORAL | Status: DC

## 2014-01-20 MED ORDER — DEXAMETHASONE 4 MG PO TABS: ORAL_TABLET | ORAL | Status: DC

## 2014-01-20 MED ORDER — SODIUM CHLORIDE 0.9 % IV SOLN
Freq: Once | INTRAVENOUS | Status: AC
  Administered 2014-01-20: 10:00:00 via INTRAVENOUS

## 2014-01-20 NOTE — Telephone Encounter (Signed)
Refilled ativan, decadron and emla cream per Lisabeth Register, NP. Patient called and notified that prescriptions were sent to [place].

## 2014-01-20 NOTE — Patient Instructions (Signed)
Dehydration, Adult Dehydration is when you lose more fluids from the body than you take in. Vital organs like the kidneys, brain, and heart cannot function without a proper amount of fluids and salt. Any loss of fluids from the body can cause dehydration.  CAUSES   Vomiting.  Diarrhea.  Excessive sweating.  Excessive urine output.  Fever. SYMPTOMS  Mild dehydration  Thirst.  Dry lips.  Slightly dry mouth. Moderate dehydration  Very dry mouth.  Sunken eyes.  Skin does not bounce back quickly when lightly pinched and released.  Dark urine and decreased urine production.  Decreased tear production.  Headache. Severe dehydration  Very dry mouth.  Extreme thirst.  Rapid, weak pulse (more than 100 beats per minute at rest).  Cold hands and feet.  Not able to sweat in spite of heat and temperature.  Rapid breathing.  Blue lips.  Confusion and lethargy.  Difficulty being awakened.  Minimal urine production.  No tears. DIAGNOSIS  Your caregiver will diagnose dehydration based on your symptoms and your exam. Blood and urine tests will help confirm the diagnosis. The diagnostic evaluation should also identify the cause of dehydration. TREATMENT  Treatment of mild or moderate dehydration can often be done at home by increasing the amount of fluids that you drink. It is best to drink small amounts of fluid more often. Drinking too much at one time can make vomiting worse. Refer to the home care instructions below. Severe dehydration needs to be treated at the hospital where you will probably be given intravenous (IV) fluids that contain water and electrolytes. HOME CARE INSTRUCTIONS   Ask your caregiver about specific rehydration instructions.  Drink enough fluids to keep your urine clear or pale yellow.  Drink small amounts frequently if you have nausea and vomiting.  Eat as you normally do.  Avoid:  Foods or drinks high in sugar.  Carbonated  drinks.  Juice.  Extremely hot or cold fluids.  Drinks with caffeine.  Fatty, greasy foods.  Alcohol.  Tobacco.  Overeating.  Gelatin desserts.  Wash your hands well to avoid spreading bacteria and viruses.  Only take over-the-counter or prescription medicines for pain, discomfort, or fever as directed by your caregiver.  Ask your caregiver if you should continue all prescribed and over-the-counter medicines.  Keep all follow-up appointments with your caregiver. SEEK MEDICAL CARE IF:  You have abdominal pain and it increases or stays in one area (localizes).  You have a rash, stiff neck, or severe headache.  You are irritable, sleepy, or difficult to awaken.  You are weak, dizzy, or extremely thirsty. SEEK IMMEDIATE MEDICAL CARE IF:   You are unable to keep fluids down or you get worse despite treatment.  You have frequent episodes of vomiting or diarrhea.  You have blood or green matter (bile) in your vomit.  You have blood in your stool or your stool looks black and tarry.  You have not urinated in 6 to 8 hours, or you have only urinated a small amount of very dark urine.  You have a fever.  You faint. MAKE SURE YOU:   Understand these instructions.  Will watch your condition.  Will get help right away if you are not doing well or get worse. Document Released: 05/20/2005 Document Revised: 08/12/2011 Document Reviewed: 01/07/2011 ExitCare Patient Information 2015 [employer]. This information is not intended to replace advice given to you by your health care provider. Make sure you discuss any questions you have with your health care   provider.  

## 2014-01-23 LAB — CULTURE, BLOOD (SINGLE)

## 2014-01-31 ENCOUNTER — Ambulatory Visit (HOSPITAL_BASED_OUTPATIENT_CLINIC_OR_DEPARTMENT_OTHER): Payer: Commercial Managed Care - HMO | Admitting: Hematology and Oncology

## 2014-01-31 ENCOUNTER — Encounter: Payer: Self-pay | Admitting: Hematology and Oncology

## 2014-01-31 ENCOUNTER — Ambulatory Visit (HOSPITAL_BASED_OUTPATIENT_CLINIC_OR_DEPARTMENT_OTHER): Payer: Commercial Managed Care - HMO

## 2014-01-31 ENCOUNTER — Other Ambulatory Visit (HOSPITAL_BASED_OUTPATIENT_CLINIC_OR_DEPARTMENT_OTHER): Payer: Commercial Managed Care - HMO

## 2014-01-31 VITALS — BP 115/64 | HR 75 | Temp 97.6°F | Resp 18 | Ht 66.0 in | Wt 150.5 lb

## 2014-01-31 VITALS — BP 106/57 | HR 70 | Temp 97.2°F | Resp 18

## 2014-01-31 DIAGNOSIS — C50219 Malignant neoplasm of upper-inner quadrant of unspecified female breast: Secondary | ICD-10-CM

## 2014-01-31 DIAGNOSIS — C50211 Malignant neoplasm of upper-inner quadrant of right female breast: Secondary | ICD-10-CM

## 2014-01-31 DIAGNOSIS — Z171 Estrogen receptor negative status [ER-]: Secondary | ICD-10-CM

## 2014-01-31 DIAGNOSIS — Z5111 Encounter for antineoplastic chemotherapy: Secondary | ICD-10-CM

## 2014-01-31 LAB — COMPREHENSIVE METABOLIC PANEL (CC13)
ALT: 22 U/L (ref 0–55)
ANION GAP: 8 meq/L (ref 3–11)
AST: 21 U/L (ref 5–34)
Albumin: 3.5 g/dL (ref 3.5–5.0)
Alkaline Phosphatase: 59 U/L (ref 40–150)
BUN: 9.3 mg/dL (ref 7.0–26.0)
CHLORIDE: 107 meq/L (ref 98–109)
CO2: 27 meq/L (ref 22–29)
Calcium: 9.2 mg/dL (ref 8.4–10.4)
Creatinine: 0.8 mg/dL (ref 0.6–1.1)
GLUCOSE: 81 mg/dL (ref 70–140)
Potassium: 4.1 mEq/L (ref 3.5–5.1)
SODIUM: 142 meq/L (ref 136–145)
Total Bilirubin: 0.29 mg/dL (ref 0.20–1.20)
Total Protein: 6.2 g/dL — ABNORMAL LOW (ref 6.4–8.3)

## 2014-01-31 LAB — CBC WITH DIFFERENTIAL/PLATELET
BASO%: 0.3 % (ref 0.0–2.0)
BASOS ABS: 0 10*3/uL (ref 0.0–0.1)
EOS%: 0.3 % (ref 0.0–7.0)
Eosinophils Absolute: 0 10*3/uL (ref 0.0–0.5)
HCT: 31.9 % — ABNORMAL LOW (ref 34.8–46.6)
HGB: 10.5 g/dL — ABNORMAL LOW (ref 11.6–15.9)
LYMPH#: 0.8 10*3/uL — AB (ref 0.9–3.3)
LYMPH%: 25 % (ref 14.0–49.7)
MCH: 34.1 pg — ABNORMAL HIGH (ref 25.1–34.0)
MCHC: 32.9 g/dL (ref 31.5–36.0)
MCV: 103.6 fL — ABNORMAL HIGH (ref 79.5–101.0)
MONO#: 0.5 10*3/uL (ref 0.1–0.9)
MONO%: 14.3 % — AB (ref 0.0–14.0)
NEUT#: 2 10*3/uL (ref 1.5–6.5)
NEUT%: 60.1 % (ref 38.4–76.8)
Platelets: 226 10*3/uL (ref 145–400)
RBC: 3.08 10*6/uL — AB (ref 3.70–5.45)
RDW: 15.5 % — ABNORMAL HIGH (ref 11.2–14.5)
WBC: 3.4 10*3/uL — AB (ref 3.9–10.3)

## 2014-01-31 MED ORDER — SODIUM CHLORIDE 0.9 % IV SOLN
Freq: Once | INTRAVENOUS | Status: AC
  Administered 2014-01-31: 10:00:00 via INTRAVENOUS

## 2014-01-31 MED ORDER — DEXAMETHASONE SODIUM PHOSPHATE 20 MG/5ML IJ SOLN
INTRAMUSCULAR | Status: AC
  Filled 2014-01-31: qty 5

## 2014-01-31 MED ORDER — PACLITAXEL CHEMO INJECTION 300 MG/50ML
80.0000 mg/m2 | Freq: Once | INTRAVENOUS | Status: AC
  Administered 2014-01-31: 144 mg via INTRAVENOUS
  Filled 2014-01-31: qty 24

## 2014-01-31 MED ORDER — SODIUM CHLORIDE 0.9 % IJ SOLN
10.0000 mL | INTRAMUSCULAR | Status: DC | PRN
  Administered 2014-01-31: 10 mL
  Filled 2014-01-31: qty 10

## 2014-01-31 MED ORDER — SODIUM CHLORIDE 0.9 % IV SOLN
160.0000 mg | Freq: Once | INTRAVENOUS | Status: AC
  Administered 2014-01-31: 160 mg via INTRAVENOUS
  Filled 2014-01-31: qty 16

## 2014-01-31 MED ORDER — DIPHENHYDRAMINE HCL 50 MG/ML IJ SOLN
50.0000 mg | Freq: Once | INTRAMUSCULAR | Status: AC
  Administered 2014-01-31: 50 mg via INTRAVENOUS

## 2014-01-31 MED ORDER — FAMOTIDINE IN NACL 20-0.9 MG/50ML-% IV SOLN
20.0000 mg | Freq: Once | INTRAVENOUS | Status: AC
  Administered 2014-01-31: 20 mg via INTRAVENOUS

## 2014-01-31 MED ORDER — ONDANSETRON 16 MG/50ML IVPB (CHCC)
INTRAVENOUS | Status: AC
  Filled 2014-01-31: qty 16

## 2014-01-31 MED ORDER — ONDANSETRON 16 MG/50ML IVPB (CHCC)
16.0000 mg | Freq: Once | INTRAVENOUS | Status: AC
  Administered 2014-01-31: 16 mg via INTRAVENOUS

## 2014-01-31 MED ORDER — DIPHENHYDRAMINE HCL 50 MG/ML IJ SOLN
INTRAMUSCULAR | Status: AC
  Filled 2014-01-31: qty 1

## 2014-01-31 MED ORDER — HEPARIN SOD (PORK) LOCK FLUSH 100 UNIT/ML IV SOLN
500.0000 [IU] | Freq: Once | INTRAVENOUS | Status: AC | PRN
  Administered 2014-01-31: 500 [IU]
  Filled 2014-01-31: qty 5

## 2014-01-31 MED ORDER — FAMOTIDINE IN NACL 20-0.9 MG/50ML-% IV SOLN
INTRAVENOUS | Status: AC
  Filled 2014-01-31: qty 50

## 2014-01-31 MED ORDER — DEXAMETHASONE SODIUM PHOSPHATE 20 MG/5ML IJ SOLN
20.0000 mg | Freq: Once | INTRAMUSCULAR | Status: AC
  Administered 2014-01-31: 20 mg via INTRAVENOUS

## 2014-01-31 NOTE — Progress Notes (Signed)
Patient Care Team: Provider Not In System as PCP - General  DIAGNOSIS: Breast cancer of upper-inner quadrant of right female breast   Primary site: Breast (Right)   Staging method: AJCC 7th Edition   Clinical: Stage IA (T1c, NX, cM0) signed by [name], MD on 08/10/2013  4:11 PM   Summary: Stage IA (T1c, NX, cM0)   Prognostic indicators: ER 0%; PR 0% Her2Neu negative, Ki-67 13%   SUMMARY OF ONCOLOGIC HISTORY:   Breast cancer of upper-inner quadrant of right female breast   08/03/2013 Initial Diagnosis Breast cancer of upper-inner quadrant of right female breast; invasive ductal carcinoma with ductal carcinoma in situ. The tumor was ER negative PR negative HER-2/neu negative with a proliferation marker Ki-67 13% ([place])   09/07/2013 Surgery Bilateral Mastectomy and SLN biopsy (Dr.Haywood) Left Breast: Benign; Right Breast IDC with DCIS Grade 3 3 SLN neg ER 0%; PR 0%; Her2: ratio 1.4 (Neg)   09/27/2013 - 01/10/2014 Chemotherapy FEC adjuvant chemo    Cancer of right breast    CHIEF COMPLIANT: Improvement in energy levels fatigue and dizziness.  INTERVAL HISTORY: Mrs. 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 She completed 6 cycles of FEC and is here today to start weekly Taxol and carboplatin. She reports that her back last week this week she is feeling a whole lot better. She does not have dizziness lightheadedness. She continues to have poor taste because of that she has not been eating as much. But she is pushing herself and is now able to get more protein intake. Denies any tingling or numbness of her hands or feet.   REVIEW OF SYSTEMS:   Constitutional: Denies fevers, chills or abnormal weight loss: Fatigue loss of taste decreased appetite and weight loss positive Eyes: Denies blurriness of vision Ears, nose, mouth, throat, and face: Denies mucositis or  sore throat Respiratory: Denies cough, dyspnea or wheezes Cardiovascular: Denies palpitation, chest discomfort or lower extremity swelling Gastrointestinal:  Denies nausea, heartburn or change in bowel habits Skin: Denies abnormal skin rashes Lymphatics: Denies new lymphadenopathy or easy bruising Neurological:Denies numbness, tingling or new weaknesses Behavioral/Psych: Mood is stable, no new changes   All other systems were reviewed with the patient and are negative.  I have reviewed the past medical history, past surgical history, social history and family history with the patient and they are unchanged from previous note.  ALLERGIES:  has No Known Allergies.  MEDICATIONS:  Current Outpatient Prescriptions  Medication Sig Dispense Refill  . Alum & Mag Hydroxide-Simeth (MAGIC MOUTHWASH) SOLN SWISH AND spit ONE TEASPOONFUL BY MOUTH FOUR TIMES DAILY AS NEEDED.  240 mL  1  . b complex vitamins tablet Take 1 tablet by mouth daily.      . Calcium Carbonate-Vitamin D (CALCIUM PLUS VITAMIN D PO) Take 1 tablet by mouth 2 (two) times daily.      Marland Kitchen CHERATUSSIN AC 100-10 MG/5ML syrup       . ciprofloxacin (CIPRO) 500 MG tablet Take 1 tablet (500 mg total) by mouth 2 (two) times daily.  14 tablet  0  . dexamethasone (DECADRON) 4 MG tablet Take 2 tablets by mouth once a day on the day after chemotherapy and then take 2 tablets two times a day for 2 days. Take with food.  30 tablet  1  . lidocaine (XYLOCAINE) 2 % solution Use as directed 5 mLs in the mouth or  throat 4 (four) times daily as needed for mouth pain (use with current supply of MMW).  120 mL  1  . lidocaine-prilocaine (EMLA) cream Apply 1 application topically as needed.  30 g  1  . LORazepam (ATIVAN) 0.5 MG tablet TAKE ONE TABLET EVERY 6 HOURS AS NEEDED FOR NAUSEA AND VOMITING  30 tablet  0  . omeprazole (PRILOSEC) 40 MG capsule Take 1 capsule (40 mg total) by mouth daily.  30 capsule  5  . ondansetron (ZOFRAN) 8 MG tablet TAKE ONE TABLET  TWICE DAILY AS NEEDED *START ON THE 3RD DAY AFTER CHEMOTHERAPY*  30 tablet  1  . prochlorperazine (COMPAZINE) 10 MG tablet TAKE ONE TABLET EVERY 6 HOURS AS NEEDED FOR NAUSEA AND VOMITING  30 tablet  1  . UNABLE TO FIND [id number] Post mastectomy camisole QTY: 2  Diagnosis Code 174.9  2 each  3  . venlafaxine (EFFEXOR) 37.5 MG tablet Take 1 tablet (37.5 mg total) by mouth 2 (two) times daily.  60 tablet  0   No current facility-administered medications for this visit.    PHYSICAL EXAMINATION: ECOG PERFORMANCE STATUS: 1 - Symptomatic but completely ambulatory  Filed Vitals:   01/31/14 0846  BP: 115/64  Pulse: 75  Temp: 97.6 F (36.4 C)  Resp: 18   Filed Weights   01/31/14 0846  Weight: 150 lb 8 oz (68.266 kg)    GENERAL:alert, no distress and comfortable SKIN: skin color, texture, turgor are normal, no rashes or significant lesions EYES: normal, Conjunctiva are pink and non-injected, sclera clear OROPHARYNX:no exudate, no erythema and lips, buccal mucosa, and tongue normal  NECK: supple, thyroid normal size, non-tender, without nodularity LYMPH:  no palpable lymphadenopathy in the cervical, axillary or inguinal LUNGS: clear to auscultation and percussion with normal breathing effort HEART: regular rate & rhythm and no murmurs and no lower extremity edema ABDOMEN:abdomen soft, non-tender and normal bowel sounds Musculoskeletal:no cyanosis of digits and no clubbing  NEURO: alert & oriented x 3 with fluent speech, no focal motor/sensory deficits  LABORATORY DATA:  I have reviewed the data as listed   Chemistry      Component Value Date/Time   NA 142 01/31/2014 0836   NA 143 01/10/2014 1045   K 4.1 01/31/2014 0836   K 4.0 01/10/2014 1045   CL 105 01/10/2014 1045   CO2 27 01/31/2014 0836   CO2 27 01/10/2014 1045   BUN 9.3 01/31/2014 0836   BUN 13 01/10/2014 1045   CREATININE 0.8 01/31/2014 0836   CREATININE 0.82 01/10/2014 1045      Component Value Date/Time   CALCIUM 9.2 01/31/2014  0836   CALCIUM 9.4 01/10/2014 1045   ALKPHOS 59 01/31/2014 0836   ALKPHOS 68 01/10/2014 1045   AST 21 01/31/2014 0836   AST 31 01/10/2014 1045   ALT 22 01/31/2014 0836   ALT 35 01/10/2014 1045   BILITOT 0.29 01/31/2014 0836   BILITOT <0.2* 01/10/2014 1045       Lab Results  Component Value Date   WBC 3.4* 01/31/2014   HGB 10.5* 01/31/2014   HCT 31.9* 01/31/2014   MCV 103.6* 01/31/2014   PLT 226 01/31/2014   NEUTROABS 2.0 01/31/2014     RADIOGRAPHIC STUDIES: I have personally reviewed the radiology reports and agreed with their findings. No results found.   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  Her appetite energy levels dizziness have all improved since last week when she was neutropenic. She is even played 45 minutes of tennis she is accompanied by her husband and is here to start the new chemotherapy regimen with Taxol carboplatin. I reviewed the risks and benefits of chemotherapy including the risk of Taxol-related allergy, neuropathy, cytopenias, risk of infections. Patient understands this and consented to proceed with the treatment.  She'll be set up for IV hydration this Thursday. Return to clinic with Ria Comment next Monday for toxicity check. I will see her back on the following week.   The patient has a good understanding of the overall plan. she agrees with it. She will call with any problems that may develop before her next visit here.  I spent 25 minutes counseling the patient face to face. The total time spent in the appointment was 30 minutes and more than 50% was on counseling and review of test results    Rulon Eisenmenger, MD 01/31/2014 9:25 AM

## 2014-01-31 NOTE — Patient Instructions (Signed)
[facility] Discharge Instructions for Patients Receiving Chemotherapy  Today you received the following chemotherapy agents taxol/carboplatin  To help prevent nausea and vomiting after your treatment, we encourage you to take your nausea medication as directed   If you develop nausea and vomiting that is not controlled by your nausea medication, call the clinic.   BELOW ARE SYMPTOMS THAT SHOULD BE REPORTED IMMEDIATELY:  *FEVER GREATER THAN 100.5 F  *CHILLS WITH OR WITHOUT FEVER  NAUSEA AND VOMITING THAT IS NOT CONTROLLED WITH YOUR NAUSEA MEDICATION  *UNUSUAL SHORTNESS OF BREATH  *UNUSUAL BRUISING OR BLEEDING  TENDERNESS IN MOUTH AND THROAT WITH OR WITHOUT PRESENCE OF ULCERS  *URINARY PROBLEMS  *BOWEL PROBLEMS  UNUSUAL RASH Items with * indicate a potential emergency and should be followed up as soon as possible.  Feel free to call the clinic you have any questions or concerns. The clinic phone number is [phone].   Paclitaxel injection What is this medicine? PACLITAXEL (PAK li TAX el) is a chemotherapy drug. It targets fast dividing cells, like cancer cells, and causes these cells to die. This medicine is used to treat ovarian cancer, breast cancer, and other cancers. This medicine may be used for other purposes; ask your health care provider or pharmacist if you have questions. COMMON BRAND NAME(S): Onxol, Taxol What should I tell my health care provider before I take this medicine? They need to know if you have any of these conditions: -blood disorders -irregular heartbeat -infection (especially a virus infection such as chickenpox, cold sores, or herpes) -liver disease -previous or ongoing radiation therapy -an unusual or allergic reaction to paclitaxel, alcohol, polyoxyethylated castor oil, other chemotherapy agents, other medicines, foods, dyes, or preservatives -pregnant or trying to get pregnant -breast-feeding How should I use this  medicine? This drug is given as an infusion into a vein. It is administered in a hospital or clinic by a specially trained health care professional. Talk to your pediatrician regarding the use of this medicine in children. Special care may be needed. Overdosage: If you think you have taken too much of this medicine contact a poison control center or emergency room at once. NOTE: This medicine is only for you. Do not share this medicine with others. What if I miss a dose? It is important not to miss your dose. Call your doctor or health care professional if you are unable to keep an appointment. What may interact with this medicine? Do not take this medicine with any of the following medications: -disulfiram -metronidazole This medicine may also interact with the following medications: -cyclosporine -diazepam -ketoconazole -medicines to increase blood counts like filgrastim, pegfilgrastim, sargramostim -other chemotherapy drugs like cisplatin, doxorubicin, epirubicin, etoposide, teniposide, vincristine -quinidine -testosterone -vaccines -verapamil Talk to your doctor or health care professional before taking any of these medicines: -acetaminophen -aspirin -ibuprofen -ketoprofen -naproxen This list may not describe all possible interactions. Give your health care provider a list of all the medicines, herbs, non-prescription drugs, or dietary supplements you use. Also tell them if you smoke, drink alcohol, or use illegal drugs. Some items may interact with your medicine. What should I watch for while using this medicine? Your condition will be monitored carefully while you are receiving this medicine. You will need important blood work done while you are taking this medicine. This drug may make you feel generally unwell. This is not uncommon, as chemotherapy can affect healthy cells as well as cancer cells. Report any side effects. Continue your course of treatment   even though you feel ill  unless your doctor tells you to stop. In some cases, you may be given additional medicines to help with side effects. Follow all directions for their use. Call your doctor or health care professional for advice if you get a fever, chills or sore throat, or other symptoms of a cold or flu. Do not treat yourself. This drug decreases your body's ability to fight infections. Try to avoid being around people who are sick. This medicine may increase your risk to bruise or bleed. Call your doctor or health care professional if you notice any unusual bleeding. Be careful brushing and flossing your teeth or using a toothpick because you may get an infection or bleed more easily. If you have any dental work done, tell your dentist you are receiving this medicine. Avoid taking products that contain aspirin, acetaminophen, ibuprofen, naproxen, or ketoprofen unless instructed by your doctor. These medicines may hide a fever. Do not become pregnant while taking this medicine. Women should inform their doctor if they wish to become pregnant or think they might be pregnant. There is a potential for serious side effects to an unborn child. Talk to your health care professional or pharmacist for more information. Do not breast-feed an infant while taking this medicine. Men are advised not to father a child while receiving this medicine. What side effects may I notice from receiving this medicine? Side effects that you should report to your doctor or health care professional as soon as possible: -allergic reactions like skin rash, itching or hives, swelling of the face, lips, or tongue -low blood counts - This drug may decrease the number of white blood cells, red blood cells and platelets. You may be at increased risk for infections and bleeding. -signs of infection - fever or chills, cough, sore throat, pain or difficulty passing urine -signs of decreased platelets or bleeding - bruising, pinpoint red spots on the skin,  black, tarry stools, nosebleeds -signs of decreased red blood cells - unusually weak or tired, fainting spells, lightheadedness -breathing problems -chest pain -high or low blood pressure -mouth sores -nausea and vomiting -pain, swelling, redness or irritation at the injection site -pain, tingling, numbness in the hands or feet -slow or irregular heartbeat -swelling of the ankle, feet, hands Side effects that usually do not require medical attention (report to your doctor or health care professional if they continue or are bothersome): -bone pain -complete hair loss including hair on your head, underarms, pubic hair, eyebrows, and eyelashes -changes in the color of fingernails -diarrhea -loosening of the fingernails -loss of appetite -muscle or joint pain -red flush to skin -sweating This list may not describe all possible side effects. Call your doctor for medical advice about side effects. You may report side effects to FDA at 1-800-FDA-1088. Where should I keep my medicine? This drug is given in a hospital or clinic and will not be stored at home. NOTE: This sheet is a summary. It may not cover all possible information. If you have questions about this medicine, talk to your doctor, pharmacist, or health care provider.  2015, [employer] Standard. (2012-07-13 16:41:21)  Carboplatin injection What is this medicine? CARBOPLATIN (KAR boe pla tin) is a chemotherapy drug. It targets fast dividing cells, like cancer cells, and causes these cells to die. This medicine is used to treat ovarian cancer and many other cancers. This medicine may be used for other purposes; ask your health care provider or pharmacist if you have questions. COMMON BRAND   NAME(S): Paraplatin What should I tell my health care provider before I take this medicine? They need to know if you have any of these conditions: -blood disorders -hearing problems -kidney disease -recent or ongoing radiation  therapy -an unusual or allergic reaction to carboplatin, cisplatin, other chemotherapy, other medicines, foods, dyes, or preservatives -pregnant or trying to get pregnant -breast-feeding How should I use this medicine? This drug is usually given as an infusion into a vein. It is administered in a hospital or clinic by a specially trained health care professional. Talk to your pediatrician regarding the use of this medicine in children. Special care may be needed. Overdosage: If you think you have taken too much of this medicine contact a poison control center or emergency room at once. NOTE: This medicine is only for you. Do not share this medicine with others. What if I miss a dose? It is important not to miss a dose. Call your doctor or health care professional if you are unable to keep an appointment. What may interact with this medicine? -medicines for seizures -medicines to increase blood counts like filgrastim, pegfilgrastim, sargramostim -some antibiotics like amikacin, gentamicin, neomycin, streptomycin, tobramycin -vaccines Talk to your doctor or health care professional before taking any of these medicines: -acetaminophen -aspirin -ibuprofen -ketoprofen -naproxen This list may not describe all possible interactions. Give your health care provider a list of all the medicines, herbs, non-prescription drugs, or dietary supplements you use. Also tell them if you smoke, drink alcohol, or use illegal drugs. Some items may interact with your medicine. What should I watch for while using this medicine? Your condition will be monitored carefully while you are receiving this medicine. You will need important blood work done while you are taking this medicine. This drug may make you feel generally unwell. This is not uncommon, as chemotherapy can affect healthy cells as well as cancer cells. Report any side effects. Continue your course of treatment even though you feel ill unless your doctor  tells you to stop. In some cases, you may be given additional medicines to help with side effects. Follow all directions for their use. Call your doctor or health care professional for advice if you get a fever, chills or sore throat, or other symptoms of a cold or flu. Do not treat yourself. This drug decreases your body's ability to fight infections. Try to avoid being around people who are sick. This medicine may increase your risk to bruise or bleed. Call your doctor or health care professional if you notice any unusual bleeding. Be careful brushing and flossing your teeth or using a toothpick because you may get an infection or bleed more easily. If you have any dental work done, tell your dentist you are receiving this medicine. Avoid taking products that contain aspirin, acetaminophen, ibuprofen, naproxen, or ketoprofen unless instructed by your doctor. These medicines may hide a fever. Do not become pregnant while taking this medicine. Women should inform their doctor if they wish to become pregnant or think they might be pregnant. There is a potential for serious side effects to an unborn child. Talk to your health care professional or pharmacist for more information. Do not breast-feed an infant while taking this medicine. What side effects may I notice from receiving this medicine? Side effects that you should report to your doctor or health care professional as soon as possible: -allergic reactions like skin rash, itching or hives, swelling of the face, lips, or tongue -signs of infection - fever or   chills, cough, sore throat, pain or difficulty passing urine -signs of decreased platelets or bleeding - bruising, pinpoint red spots on the skin, black, tarry stools, nosebleeds -signs of decreased red blood cells - unusually weak or tired, fainting spells, lightheadedness -breathing problems -changes in hearing -changes in vision -chest pain -high blood pressure -low blood counts - This  drug may decrease the number of white blood cells, red blood cells and platelets. You may be at increased risk for infections and bleeding. -nausea and vomiting -pain, swelling, redness or irritation at the injection site -pain, tingling, numbness in the hands or feet -problems with balance, talking, walking -trouble passing urine or change in the amount of urine Side effects that usually do not require medical attention (report to your doctor or health care professional if they continue or are bothersome): -hair loss -loss of appetite -metallic taste in the mouth or changes in taste This list may not describe all possible side effects. Call your doctor for medical advice about side effects. You may report side effects to FDA at 1-800-FDA-1088. Where should I keep my medicine? This drug is given in a hospital or clinic and will not be stored at home. NOTE: This sheet is a summary. It may not cover all possible information. If you have questions about this medicine, talk to your doctor, pharmacist, or health care provider.  2015, [employer]/Gold Standard. (2007-08-25 14:38:05)   

## 2014-01-31 NOTE — Assessment & Plan Note (Signed)
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  Her appetite energy levels dizziness have all improved since last week when she was neutropenic. She is even played 45 minutes of tennis she is accompanied by her husband and is here to start the new chemotherapy regimen with Taxol carboplatin. I reviewed the risks and benefits of chemotherapy including the risk of Taxol-related allergy, neuropathy, cytopenias, risk of infections. Patient understands this and consented to proceed with the treatment.  She'll be set up for IV hydration this Thursday. Return to clinic with Ria Comment next Monday for toxicity check. I will see her back on the following week.

## 2014-02-01 ENCOUNTER — Telehealth: Payer: Self-pay | Admitting: *Deleted

## 2014-02-01 NOTE — Telephone Encounter (Signed)
Spoke with pt today for post chemo follow up.  Pt stated she is doing fine.  Denied nausea/vomiting;  Good appetite - but no taste - and drinking lots of fluids as tolerated.  Bowel and bladder function fine.  Denied pain.   Pt aware of appt for IVF on 02/03/14; however, pt stated she would call early am that day if pt feels not needed.   Pt aware of next returned appts.

## 2014-02-02 ENCOUNTER — Telehealth: Payer: Self-pay | Admitting: Hematology and Oncology

## 2014-02-02 NOTE — Telephone Encounter (Signed)
, °

## 2014-02-03 ENCOUNTER — Ambulatory Visit: Payer: Commercial Managed Care - HMO

## 2014-02-03 ENCOUNTER — Encounter: Payer: Self-pay | Admitting: Oncology

## 2014-02-03 NOTE — Progress Notes (Signed)
Will deactivate 1st step Neulasta card. The patient has [employer] only. Patient is resp for rem balance.

## 2014-02-08 ENCOUNTER — Ambulatory Visit (HOSPITAL_BASED_OUTPATIENT_CLINIC_OR_DEPARTMENT_OTHER): Payer: Commercial Managed Care - HMO | Admitting: Adult Health

## 2014-02-08 ENCOUNTER — Other Ambulatory Visit (HOSPITAL_BASED_OUTPATIENT_CLINIC_OR_DEPARTMENT_OTHER): Payer: Commercial Managed Care - HMO

## 2014-02-08 ENCOUNTER — Ambulatory Visit (HOSPITAL_BASED_OUTPATIENT_CLINIC_OR_DEPARTMENT_OTHER): Payer: Commercial Managed Care - HMO

## 2014-02-08 ENCOUNTER — Encounter: Payer: Self-pay | Admitting: Adult Health

## 2014-02-08 VITALS — BP 114/57 | HR 84 | Temp 98.4°F | Resp 18 | Ht 66.0 in | Wt 149.6 lb

## 2014-02-08 DIAGNOSIS — C50919 Malignant neoplasm of unspecified site of unspecified female breast: Secondary | ICD-10-CM

## 2014-02-08 DIAGNOSIS — D6481 Anemia due to antineoplastic chemotherapy: Secondary | ICD-10-CM

## 2014-02-08 DIAGNOSIS — C50219 Malignant neoplasm of upper-inner quadrant of unspecified female breast: Secondary | ICD-10-CM

## 2014-02-08 DIAGNOSIS — Z5111 Encounter for antineoplastic chemotherapy: Secondary | ICD-10-CM

## 2014-02-08 DIAGNOSIS — C50211 Malignant neoplasm of upper-inner quadrant of right female breast: Secondary | ICD-10-CM

## 2014-02-08 DIAGNOSIS — Z171 Estrogen receptor negative status [ER-]: Secondary | ICD-10-CM

## 2014-02-08 DIAGNOSIS — T451X5A Adverse effect of antineoplastic and immunosuppressive drugs, initial encounter: Secondary | ICD-10-CM

## 2014-02-08 DIAGNOSIS — Z9010 Acquired absence of unspecified breast and nipple: Secondary | ICD-10-CM

## 2014-02-08 LAB — CBC WITH DIFFERENTIAL/PLATELET
BASO%: 0.3 % (ref 0.0–2.0)
BASOS ABS: 0 10*3/uL (ref 0.0–0.1)
EOS%: 0.3 % (ref 0.0–7.0)
Eosinophils Absolute: 0 10*3/uL (ref 0.0–0.5)
HCT: 33.1 % — ABNORMAL LOW (ref 34.8–46.6)
HGB: 11.1 g/dL — ABNORMAL LOW (ref 11.6–15.9)
LYMPH%: 11.4 % — AB (ref 14.0–49.7)
MCH: 33.8 pg (ref 25.1–34.0)
MCHC: 33.5 g/dL (ref 31.5–36.0)
MCV: 100.9 fL (ref 79.5–101.0)
MONO#: 0.3 10*3/uL (ref 0.1–0.9)
MONO%: 8.7 % (ref 0.0–14.0)
NEUT#: 3 10*3/uL (ref 1.5–6.5)
NEUT%: 79.3 % — ABNORMAL HIGH (ref 38.4–76.8)
PLATELETS: 176 10*3/uL (ref 145–400)
RBC: 3.28 10*6/uL — ABNORMAL LOW (ref 3.70–5.45)
RDW: 14.4 % (ref 11.2–14.5)
WBC: 3.8 10*3/uL — ABNORMAL LOW (ref 3.9–10.3)
lymph#: 0.4 10*3/uL — ABNORMAL LOW (ref 0.9–3.3)

## 2014-02-08 LAB — COMPREHENSIVE METABOLIC PANEL (CC13)
ALK PHOS: 64 U/L (ref 40–150)
ALT: 14 U/L (ref 0–55)
AST: 17 U/L (ref 5–34)
Albumin: 3.4 g/dL — ABNORMAL LOW (ref 3.5–5.0)
Anion Gap: 7 mEq/L (ref 3–11)
BUN: 11.2 mg/dL (ref 7.0–26.0)
CO2: 25 mEq/L (ref 22–29)
CREATININE: 0.8 mg/dL (ref 0.6–1.1)
Calcium: 9 mg/dL (ref 8.4–10.4)
Chloride: 104 mEq/L (ref 98–109)
Glucose: 103 mg/dl (ref 70–140)
POTASSIUM: 4.4 meq/L (ref 3.5–5.1)
Sodium: 136 mEq/L (ref 136–145)
Total Bilirubin: 0.3 mg/dL (ref 0.20–1.20)
Total Protein: 6.3 g/dL — ABNORMAL LOW (ref 6.4–8.3)

## 2014-02-08 MED ORDER — FAMOTIDINE IN NACL 20-0.9 MG/50ML-% IV SOLN
20.0000 mg | Freq: Once | INTRAVENOUS | Status: AC
  Administered 2014-02-08: 20 mg via INTRAVENOUS

## 2014-02-08 MED ORDER — PACLITAXEL CHEMO INJECTION 300 MG/50ML
80.0000 mg/m2 | Freq: Once | INTRAVENOUS | Status: AC
  Administered 2014-02-08: 144 mg via INTRAVENOUS
  Filled 2014-02-08: qty 24

## 2014-02-08 MED ORDER — SODIUM CHLORIDE 0.9 % IV SOLN
Freq: Once | INTRAVENOUS | Status: AC
  Administered 2014-02-08: 12:00:00 via INTRAVENOUS

## 2014-02-08 MED ORDER — SODIUM CHLORIDE 0.9 % IJ SOLN
10.0000 mL | INTRAMUSCULAR | Status: DC | PRN
  Administered 2014-02-08: 10 mL
  Filled 2014-02-08: qty 10

## 2014-02-08 MED ORDER — ONDANSETRON 16 MG/50ML IVPB (CHCC)
16.0000 mg | Freq: Once | INTRAVENOUS | Status: AC
  Administered 2014-02-08: 16 mg via INTRAVENOUS

## 2014-02-08 MED ORDER — FAMOTIDINE IN NACL 20-0.9 MG/50ML-% IV SOLN
INTRAVENOUS | Status: AC
  Filled 2014-02-08: qty 50

## 2014-02-08 MED ORDER — DIPHENHYDRAMINE HCL 50 MG/ML IJ SOLN
INTRAMUSCULAR | Status: AC
  Filled 2014-02-08: qty 1

## 2014-02-08 MED ORDER — ONDANSETRON 16 MG/50ML IVPB (CHCC)
INTRAVENOUS | Status: AC
  Filled 2014-02-08: qty 16

## 2014-02-08 MED ORDER — DEXAMETHASONE SODIUM PHOSPHATE 20 MG/5ML IJ SOLN
INTRAMUSCULAR | Status: AC
  Filled 2014-02-08: qty 5

## 2014-02-08 MED ORDER — DIPHENHYDRAMINE HCL 50 MG/ML IJ SOLN
50.0000 mg | Freq: Once | INTRAMUSCULAR | Status: AC
  Administered 2014-02-08: 25 mg via INTRAVENOUS

## 2014-02-08 MED ORDER — DEXAMETHASONE SODIUM PHOSPHATE 20 MG/5ML IJ SOLN
20.0000 mg | Freq: Once | INTRAMUSCULAR | Status: AC
  Administered 2014-02-08: 20 mg via INTRAVENOUS

## 2014-02-08 MED ORDER — HEPARIN SOD (PORK) LOCK FLUSH 100 UNIT/ML IV SOLN
500.0000 [IU] | Freq: Once | INTRAVENOUS | Status: AC | PRN
  Administered 2014-02-08: 500 [IU]
  Filled 2014-02-08: qty 5

## 2014-02-08 NOTE — Patient Instructions (Signed)
[facility] Discharge Instructions for Patients Receiving Chemotherapy  Today you received the following chemotherapy agents: Taxol.  To help prevent nausea and vomiting after your treatment, we encourage you to take your nausea medication as prescribed.   If you develop nausea and vomiting that is not controlled by your nausea medication, call the clinic.   BELOW ARE SYMPTOMS THAT SHOULD BE REPORTED IMMEDIATELY:  *FEVER GREATER THAN 100.5 F  *CHILLS WITH OR WITHOUT FEVER  NAUSEA AND VOMITING THAT IS NOT CONTROLLED WITH YOUR NAUSEA MEDICATION  *UNUSUAL SHORTNESS OF BREATH  *UNUSUAL BRUISING OR BLEEDING  TENDERNESS IN MOUTH AND THROAT WITH OR WITHOUT PRESENCE OF ULCERS  *URINARY PROBLEMS  *BOWEL PROBLEMS  UNUSUAL RASH Items with * indicate a potential emergency and should be followed up as soon as possible.  Feel free to call the clinic you have any questions or concerns. The clinic phone number is [phone].    

## 2014-02-08 NOTE — Progress Notes (Addendum)
Patient Care Team: Provider Not In System as PCP - General  DIAGNOSIS: Breast cancer of upper-inner quadrant of right female breast   Primary site: Breast (Right)   Staging method: AJCC 7th Edition   Clinical: Stage IA (T1c, NX, cM0) signed by [name], MD on 08/10/2013  4:11 PM   Summary: Stage IA (T1c, NX, cM0)   Prognostic indicators: ER 0%; PR 0% Her2Neu negative, Ki-67 13%   SUMMARY OF ONCOLOGIC HISTORY:   Breast cancer of upper-inner quadrant of right female breast   08/03/2013 Initial Diagnosis Breast cancer of upper-inner quadrant of right female breast; invasive ductal carcinoma with ductal carcinoma in situ. The tumor was ER negative PR negative HER-2/neu negative with a proliferation marker Ki-67 13% ([place])   09/07/2013 Surgery Bilateral Mastectomy and SLN biopsy (Dr.Haywood) Left Breast: Benign; Right Breast IDC with DCIS Grade 3 3 SLN neg ER 0%; PR 0%; Her2: ratio 1.4 (Neg)   09/27/2013 - 01/10/2014 Chemotherapy FEC adjuvant chemo    Cancer of right breast    CHIEF COMPLIANT: Adjuvant chemotherapy for breast cancer.    INTERVAL HISTORY: Wendy [name] is a 68 year old Caucasian lady with above-mentioned history of triple negative breast cancer who had bilateral mastectomies and is currently on adjuvant chemotherapy. She was started on Paclitaxel and Carboplatin last week.  She tolerated the infusion well, however she became suddenly fatigued on Saturday and didn't have any energy Saturday or Sunday.  She is also c/o a food aversion.  This started on Wednesday, September 2.  She has been eating a balanced breakfast, chocolate protein drinks twice a day, and then her food intake weans off towards the end of the day.  She has some significant taste changes and relays that food tastes awful.  On Sunday morning (02/06/14)  her left foot above her ankle became numb, this slowly resolved and was gone the following day.  The left great toe has remained numb.  She has had an intermittent  stabbing pain in her head that started yesterday, (02/07/14) and has remained occasional today.  She denies weakness, vision changes, fevers, chills, nausea, vomiting, constipation, diarrhea, skin changes, nail changes, or any further concerns.   REVIEW OF SYSTEMS:   A 10 point review of systems was conducted and is otherwise negative except for what is noted above.    Past Medical History  Diagnosis Date  . Family history of anesthesia complication     "daughter PONV; long time in recovery" (09/07/2013)  . Asthma     as a child  . Asthmatic bronchitis     "about q yr" (09/07/2013)  . Pneumonia     "several times; usually follows asthmatic bronchitis" (09/07/2013)  . Sinus headache     "maybe 2-3 times/month"  . Breast cancer     "right breast"    Past Surgical History  Procedure Laterality Date  . Tubal ligation  1977  . Finger fracture surgery Left 2010    "middle finger spiral fx; little finger broken; tissue damage"  . Tonsillectomy    . Portacath placement Right 09/07/2013  . Mastectomy complete / simple w/ sentinel node biopsy Right 09/07/2013  . Mastectomy complete / simple  09/07/2013  . Breast biopsy Right 07/2013  . Dilation and curettage of uterus      S/P miscarriage  . Wrist fracture surgery Left 2010  . Mastectomy w/ sentinel node biopsy Bilateral 09/07/2013    Procedure: BILATERAL MASTECTOMY WITH RIGHT SENTINEL LYMPH NODE BIOPSY;  Surgeon: Edsel Petrin  Dalbert Batman, MD;  Location: [facility];  Service: General;  Laterality: Bilateral;  . Portacath placement Right 09/07/2013    Procedure: INSERTION PORT-A-CATH;  Surgeon: [name], MD;  Location: [facility];  Service: General;  Laterality: Right;   Family History  Problem Relation Age of Onset  . Bladder Cancer Sister 45    worked in [employer], around 2nd hand smoke all her life  . Diabetes Sister   . Colon cancer Brother 50    non-smoker  . Diabetes Brother   . Colon cancer Maternal Uncle 46  . Osteoporosis Mother   . Parkinson's  disease Mother   . Diabetes Mother   . Rheum arthritis Father   . Diabetes Father   . Breast cancer Paternal Aunt     dx over 92  . Diabetes Maternal Grandmother   . Diabetes Maternal Grandfather   . Uterine cancer Other 50  . Colon cancer Maternal Aunt     dx in her 66s-70s  . Breast cancer Cousin     paternal cousin dx in her 77s-70s      ALLERGIES:  has No Known Allergies.  MEDICATIONS:  Current Outpatient Prescriptions  Medication Sig Dispense Refill  . b complex vitamins tablet Take 1 tablet by mouth daily.      . Calcium Carbonate-Vitamin D (CALCIUM PLUS VITAMIN D PO) Take 1 tablet by mouth 2 (two) times daily.      Marland Kitchen dexamethasone (DECADRON) 4 MG tablet Take 2 tablets by mouth once a day on the day after chemotherapy and then take 2 tablets two times a day for 2 days. Take with food.  30 tablet  1  . lidocaine-prilocaine (EMLA) cream Apply 1 application topically as needed.  30 g  1  . LORazepam (ATIVAN) 0.5 MG tablet TAKE ONE TABLET EVERY 6 HOURS AS NEEDED FOR NAUSEA AND VOMITING  30 tablet  0  . omeprazole (PRILOSEC) 40 MG capsule Take 1 capsule (40 mg total) by mouth daily.  30 capsule  5  . UNABLE TO FIND [id number] Post mastectomy camisole QTY: 2  Diagnosis Code 174.9  2 each  3  . Alum & Mag Hydroxide-Simeth (MAGIC MOUTHWASH) SOLN SWISH AND spit ONE TEASPOONFUL BY MOUTH FOUR TIMES DAILY AS NEEDED.  240 mL  1  . CHERATUSSIN AC 100-10 MG/5ML syrup       . ciprofloxacin (CIPRO) 500 MG tablet Take 1 tablet (500 mg total) by mouth 2 (two) times daily.  14 tablet  0  . lidocaine (XYLOCAINE) 2 % solution Use as directed 5 mLs in the mouth or throat 4 (four) times daily as needed for mouth pain (use with current supply of MMW).  120 mL  1  . ondansetron (ZOFRAN) 8 MG tablet TAKE ONE TABLET TWICE DAILY AS NEEDED *START ON THE 3RD DAY AFTER CHEMOTHERAPY*  30 tablet  1  . prochlorperazine (COMPAZINE) 10 MG tablet TAKE ONE TABLET EVERY 6 HOURS AS NEEDED FOR NAUSEA AND VOMITING  30  tablet  1  . venlafaxine (EFFEXOR) 37.5 MG tablet Take 1 tablet (37.5 mg total) by mouth 2 (two) times daily.  60 tablet  0   No current facility-administered medications for this visit.    PHYSICAL EXAMINATION: ECOG PERFORMANCE STATUS: 1 - Symptomatic but completely ambulatory  Filed Vitals:   02/08/14 1024  BP: 114/57  Pulse: 84  Temp: 98.4 F (36.9 C)  Resp: 18   Filed Weights   02/08/14 1024  Weight: 149 lb 9.6 oz (67.858  kg)    GENERAL: Patient is a well appearing female in no acute distress HEENT:  Sclerae anicteric.  Oropharynx clear and moist. No ulcerations or evidence of oropharyngeal candidiasis. Neck is supple.  NODES:  No cervical, supraclavicular, or axillary lymphadenopathy palpated.  BREAST EXAM:  Deferred. LUNGS:  Clear to auscultation bilaterally.  No wheezes or rhonchi. HEART:  Regular rate and rhythm. No murmur appreciated. ABDOMEN:  Soft, nontender.  Positive, normoactive bowel sounds. No organomegaly palpated. MSK:  No focal spinal tenderness to palpation. Full range of motion bilaterally in the upper extremities. EXTREMITIES:  No peripheral edema.   SKIN:  Clear with no obvious rashes or skin changes. No nail dyscrasia. NEURO:  Nonfocal. Well oriented.  Appropriate affect.    LABORATORY DATA:    Chemistry      Component Value Date/Time   NA 142 01/31/2014 0836   NA 143 01/10/2014 1045   K 4.1 01/31/2014 0836   K 4.0 01/10/2014 1045   CL 105 01/10/2014 1045   CO2 27 01/31/2014 0836   CO2 27 01/10/2014 1045   BUN 9.3 01/31/2014 0836   BUN 13 01/10/2014 1045   CREATININE 0.8 01/31/2014 0836   CREATININE 0.82 01/10/2014 1045      Component Value Date/Time   CALCIUM 9.2 01/31/2014 0836   CALCIUM 9.4 01/10/2014 1045   ALKPHOS 59 01/31/2014 0836   ALKPHOS 68 01/10/2014 1045   AST 21 01/31/2014 0836   AST 31 01/10/2014 1045   ALT 22 01/31/2014 0836   ALT 35 01/10/2014 1045   BILITOT 0.29 01/31/2014 0836   BILITOT <0.2* 01/10/2014 1045       Lab Results   Component Value Date   WBC 3.8* 02/08/2014   HGB 11.1* 02/08/2014   HCT 33.1* 02/08/2014   MCV 100.9 02/08/2014   PLT 176 02/08/2014   NEUTROABS 3.0 02/08/2014      ASSESSMENT:  Stage IA T1, N0, 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  Due to difficulty with tolerating the treatment, she will receive Paclitaxel alone beginning with cycle 2 and for the rest of her weekly chemotherapy regimen.    PLAN:  Wendy [name] is doing moderately well today.  She does continue to have a mild treatment related anemia that we will continue to monitor.  Her labs are otherwise stable and I reviewed them with her in detail.  Her numbness and food aversion is troublesome for her.  I reviewed this with Dr. Lindi Adie who also met with the patient today.  After discussion, she will not receive the Carboplatin with her second or any subsequent cycles of treatment.  She will proceed with Paclitaxel alone today.    Wendy [name] will return in one week for labs, evaluation, and her weekly treatment.    The patient has a good understanding of the overall plan. she agrees with it. She will call with any problems that may develop before her next visit here.  I spent 25 minutes counseling the patient face to face.  The total time spent in the appointment was 30 minutes.  Minette Headland, NP Medical On[facility] [phone] 02/08/2014 10:42 AM   Attending Note  I personally saw and examined Wendy [name]. The plan of care was discussed with her. I agree with the assessment and plan  as documented above. Patient could not tolerate Taxol and carboplatin. She developed profound neuropathy that was transient after the first cycle but enough to be of major concern for her given her active lifestyle with  tenderness. After reviewing the risks and benefits we elected to discontinue carboplatin and continue her on weekly Taxol. Patient understands that even with weekly Taxol there could be some side effects of neuropathy. Signed Rulon Eisenmenger, MD

## 2014-02-14 ENCOUNTER — Other Ambulatory Visit (HOSPITAL_BASED_OUTPATIENT_CLINIC_OR_DEPARTMENT_OTHER): Payer: Commercial Managed Care - HMO

## 2014-02-14 ENCOUNTER — Ambulatory Visit (HOSPITAL_BASED_OUTPATIENT_CLINIC_OR_DEPARTMENT_OTHER): Payer: Commercial Managed Care - HMO | Admitting: Hematology and Oncology

## 2014-02-14 ENCOUNTER — Ambulatory Visit (HOSPITAL_BASED_OUTPATIENT_CLINIC_OR_DEPARTMENT_OTHER): Payer: Commercial Managed Care - HMO

## 2014-02-14 VITALS — BP 100/52 | HR 79 | Temp 98.0°F | Resp 18 | Ht 66.0 in | Wt 149.6 lb

## 2014-02-14 DIAGNOSIS — C50211 Malignant neoplasm of upper-inner quadrant of right female breast: Secondary | ICD-10-CM

## 2014-02-14 DIAGNOSIS — Z5111 Encounter for antineoplastic chemotherapy: Secondary | ICD-10-CM

## 2014-02-14 DIAGNOSIS — C50219 Malignant neoplasm of upper-inner quadrant of unspecified female breast: Secondary | ICD-10-CM

## 2014-02-14 DIAGNOSIS — G609 Hereditary and idiopathic neuropathy, unspecified: Secondary | ICD-10-CM

## 2014-02-14 DIAGNOSIS — Z171 Estrogen receptor negative status [ER-]: Secondary | ICD-10-CM

## 2014-02-14 LAB — CBC WITH DIFFERENTIAL/PLATELET
BASO%: 0.3 % (ref 0.0–2.0)
Basophils Absolute: 0 10*3/uL (ref 0.0–0.1)
EOS%: 0.9 % (ref 0.0–7.0)
Eosinophils Absolute: 0 10*3/uL (ref 0.0–0.5)
HEMATOCRIT: 33.1 % — AB (ref 34.8–46.6)
HGB: 11 g/dL — ABNORMAL LOW (ref 11.6–15.9)
LYMPH%: 26.6 % (ref 14.0–49.7)
MCH: 33.4 pg (ref 25.1–34.0)
MCHC: 33.2 g/dL (ref 31.5–36.0)
MCV: 100.6 fL (ref 79.5–101.0)
MONO#: 0.1 10*3/uL (ref 0.1–0.9)
MONO%: 3 % (ref 0.0–14.0)
NEUT#: 2.3 10*3/uL (ref 1.5–6.5)
NEUT%: 69.2 % (ref 38.4–76.8)
Platelets: 164 10*3/uL (ref 145–400)
RBC: 3.29 10*6/uL — AB (ref 3.70–5.45)
RDW: 14 % (ref 11.2–14.5)
WBC: 3.3 10*3/uL — ABNORMAL LOW (ref 3.9–10.3)
lymph#: 0.9 10*3/uL (ref 0.9–3.3)

## 2014-02-14 LAB — COMPREHENSIVE METABOLIC PANEL (CC13)
ALK PHOS: 57 U/L (ref 40–150)
ALT: 23 U/L (ref 0–55)
AST: 16 U/L (ref 5–34)
Albumin: 3.3 g/dL — ABNORMAL LOW (ref 3.5–5.0)
Anion Gap: 9 mEq/L (ref 3–11)
BILIRUBIN TOTAL: 0.33 mg/dL (ref 0.20–1.20)
BUN: 15.1 mg/dL (ref 7.0–26.0)
CO2: 25 meq/L (ref 22–29)
CREATININE: 0.8 mg/dL (ref 0.6–1.1)
Calcium: 8.9 mg/dL (ref 8.4–10.4)
Chloride: 105 mEq/L (ref 98–109)
Glucose: 92 mg/dl (ref 70–140)
Potassium: 4 mEq/L (ref 3.5–5.1)
SODIUM: 138 meq/L (ref 136–145)
TOTAL PROTEIN: 5.9 g/dL — AB (ref 6.4–8.3)

## 2014-02-14 MED ORDER — DIPHENHYDRAMINE HCL 50 MG/ML IJ SOLN
INTRAMUSCULAR | Status: AC
  Filled 2014-02-14: qty 1

## 2014-02-14 MED ORDER — FAMOTIDINE IN NACL 20-0.9 MG/50ML-% IV SOLN
20.0000 mg | Freq: Once | INTRAVENOUS | Status: AC
Start: 2014-02-14 — End: 2014-02-14
  Administered 2014-02-14: 20 mg via INTRAVENOUS

## 2014-02-14 MED ORDER — ONDANSETRON 16 MG/50ML IVPB (CHCC)
16.0000 mg | Freq: Once | INTRAVENOUS | Status: AC
  Administered 2014-02-14: 16 mg via INTRAVENOUS

## 2014-02-14 MED ORDER — DEXAMETHASONE SODIUM PHOSPHATE 20 MG/5ML IJ SOLN
INTRAMUSCULAR | Status: AC
  Filled 2014-02-14: qty 5

## 2014-02-14 MED ORDER — DIPHENHYDRAMINE HCL 50 MG/ML IJ SOLN
50.0000 mg | Freq: Once | INTRAMUSCULAR | Status: AC
  Administered 2014-02-14: 25 mg via INTRAVENOUS

## 2014-02-14 MED ORDER — ONDANSETRON 16 MG/50ML IVPB (CHCC)
INTRAVENOUS | Status: AC
  Filled 2014-02-14: qty 16

## 2014-02-14 MED ORDER — FAMOTIDINE IN NACL 20-0.9 MG/50ML-% IV SOLN
INTRAVENOUS | Status: AC
  Filled 2014-02-14: qty 50

## 2014-02-14 MED ORDER — DEXAMETHASONE SODIUM PHOSPHATE 20 MG/5ML IJ SOLN
20.0000 mg | Freq: Once | INTRAMUSCULAR | Status: AC
  Administered 2014-02-14: 20 mg via INTRAVENOUS

## 2014-02-14 MED ORDER — SODIUM CHLORIDE 0.9 % IV SOLN
Freq: Once | INTRAVENOUS | Status: AC
  Administered 2014-02-14: 10:00:00 via INTRAVENOUS

## 2014-02-14 MED ORDER — HEPARIN SOD (PORK) LOCK FLUSH 100 UNIT/ML IV SOLN
500.0000 [IU] | Freq: Once | INTRAVENOUS | Status: AC | PRN
  Administered 2014-02-14: 500 [IU]
  Filled 2014-02-14: qty 5

## 2014-02-14 MED ORDER — SODIUM CHLORIDE 0.9 % IJ SOLN
10.0000 mL | INTRAMUSCULAR | Status: DC | PRN
  Administered 2014-02-14: 10 mL
  Filled 2014-02-14: qty 10

## 2014-02-14 MED ORDER — PACLITAXEL CHEMO INJECTION 300 MG/50ML
80.0000 mg/m2 | Freq: Once | INTRAVENOUS | Status: AC
  Administered 2014-02-14: 144 mg via INTRAVENOUS
  Filled 2014-02-14: qty 24

## 2014-02-14 NOTE — Assessment & Plan Note (Signed)
Stage IA T1, N0, M0 grade 2 triple negative right breast cancer status post bilateral mastectomies: Patient is on adjuvant chemotherapy with weekly Taxol. We are watching her very closely for development of neuropathy. Because she is an avid tennis that we do not want to limit her activities, hence we are watching her very closely with weekly followups.  Neuropathy: She notices in the tips of her fingers on her left hand. It is not significant enough to warrant any change in dosage at this time. But if this continues or gets worse, we might have to decrease the dosage of  Taxol.  Followup in one week with Mendel Ryder as a shared visit.

## 2014-02-14 NOTE — Patient Instructions (Signed)
[facility] Discharge Instructions for Patients Receiving Chemotherapy  Today you received the following chemotherapy agent Taxol.  To help prevent nausea and vomiting after your treatment, we encourage you to take your nausea medication as directed by your physician.   If you develop nausea and vomiting that is not controlled by your nausea medication, call the clinic.   BELOW ARE SYMPTOMS THAT SHOULD BE REPORTED IMMEDIATELY:  *FEVER GREATER THAN 100.5 F  *CHILLS WITH OR WITHOUT FEVER  NAUSEA AND VOMITING THAT IS NOT CONTROLLED WITH YOUR NAUSEA MEDICATION  *UNUSUAL SHORTNESS OF BREATH  *UNUSUAL BRUISING OR BLEEDING  TENDERNESS IN MOUTH AND THROAT WITH OR WITHOUT PRESENCE OF ULCERS  *URINARY PROBLEMS  *BOWEL PROBLEMS  UNUSUAL RASH Items with * indicate a potential emergency and should be followed up as soon as possible.  Feel free to call the clinic you have any questions or concerns. The clinic phone number is [phone].

## 2014-02-14 NOTE — Progress Notes (Signed)
Patient Care Team: Provider Not In System as PCP - General  DIAGNOSIS: Breast cancer of upper-inner quadrant of right female breast   Primary site: Breast (Right)   Staging method: AJCC 7th Edition   Clinical: Stage IA (T1c, NX, cM0) signed by [name], MD on 08/10/2013  4:11 PM   Summary: Stage IA (T1c, NX, cM0)   Prognostic indicators: ER 0%; PR 0% Her2Neu negative, Ki-67 13%   SUMMARY OF ONCOLOGIC HISTORY:   Breast cancer of upper-inner quadrant of right female breast   08/03/2013 Initial Diagnosis Breast cancer of upper-inner quadrant of right female breast; invasive ductal carcinoma with ductal carcinoma in situ. The tumor was ER negative PR negative HER-2/neu negative with a proliferation marker Ki-67 13% ([place])   09/07/2013 Surgery Bilateral Mastectomy and SLN biopsy (Dr.Haywood) Left Breast: Benign; Right Breast IDC with DCIS Grade 3 3 SLN neg ER 0%; PR 0%; Her2: ratio 1.4 (Neg)   09/27/2013 - 01/10/2014 Chemotherapy FEC adjuvant chemo    Cancer of right breast    CHIEF COMPLIANT: Patient is here for week 3 of Taxol adjuvant chemotherapy  INTERVAL HISTORY: Wendy [name] is a 68 year old Caucasian lady with above-mentioned history of triple negative breast cancer was treated with surgery with bilateral mastectomies and has received FEC chemotherapy and is now on weekly Taxol treatments. After week one, I discontinued carboplatin. She received Taxol alone last week. Overall, she tolerated it well. Still does not have any taste for food but she is doing everything she can do to eat. She continues to play tennis every day for 45 minutes. She noticed mild tingling in her left fingers. No problems with bowel or bladder function.   REVIEW OF SYSTEMS:   Constitutional: Denies fevers, chills or abnormal weight loss, loss of taste Eyes: Denies blurriness of vision Ears, nose, mouth, throat, and face: Denies mucositis or sore throat Respiratory: Denies cough, dyspnea or  wheezes Cardiovascular: Denies palpitation, chest discomfort or lower extremity swelling Gastrointestinal:  Denies nausea, heartburn or change in bowel habits Skin: Denies abnormal skin rashes Lymphatics: Denies new lymphadenopathy or easy bruising Neurological:Denies numbness, tingling or new weaknesses Behavioral/Psych: Mood is stable, no new changes  All other systems were reviewed with the patient and are negative.  I have reviewed the past medical history, past surgical history, social history and family history with the patient and they are unchanged from previous note.  ALLERGIES:  has No Known Allergies.  MEDICATIONS:  Current Outpatient Prescriptions  Medication Sig Dispense Refill  . b complex vitamins tablet Take 1 tablet by mouth daily.      . Calcium Carbonate-Vitamin D (CALCIUM PLUS VITAMIN D PO) Take 1 tablet by mouth 2 (two) times daily.      Marland Kitchen dexamethasone (DECADRON) 4 MG tablet Take 2 tablets by mouth once a day on the day after chemotherapy and then take 2 tablets two times a day for 2 days. Take with food.  30 tablet  1  . lidocaine-prilocaine (EMLA) cream Apply 1 application topically as needed.  30 g  1  . LORazepam (ATIVAN) 0.5 MG tablet TAKE ONE TABLET EVERY 6 HOURS AS NEEDED FOR NAUSEA AND VOMITING  30 tablet  0  . omeprazole (PRILOSEC) 40 MG capsule Take 1 capsule (40 mg total) by mouth daily.  30 capsule  5  . ondansetron (ZOFRAN) 8 MG tablet TAKE ONE TABLET TWICE DAILY AS NEEDED *START ON THE 3RD DAY AFTER CHEMOTHERAPY*  30 tablet  1  . prochlorperazine (COMPAZINE) 10  MG tablet TAKE ONE TABLET EVERY 6 HOURS AS NEEDED FOR NAUSEA AND VOMITING  30 tablet  1  . UNABLE TO FIND [id number] Post mastectomy camisole QTY: 2  Diagnosis Code 174.9  2 each  3  . venlafaxine (EFFEXOR) 37.5 MG tablet Take 1 tablet (37.5 mg total) by mouth 2 (two) times daily.  60 tablet  0  . Alum & Mag Hydroxide-Simeth (MAGIC MOUTHWASH) SOLN SWISH AND spit ONE TEASPOONFUL BY MOUTH FOUR TIMES  DAILY AS NEEDED.  240 mL  1  . CHERATUSSIN AC 100-10 MG/5ML syrup       . ciprofloxacin (CIPRO) 500 MG tablet Take 1 tablet (500 mg total) by mouth 2 (two) times daily.  14 tablet  0  . lidocaine (XYLOCAINE) 2 % solution Use as directed 5 mLs in the mouth or throat 4 (four) times daily as needed for mouth pain (use with current supply of MMW).  120 mL  1   No current facility-administered medications for this visit.   Facility-Administered Medications Ordered in Other Visits  Medication Dose Route Frequency Provider Last Rate Last Dose  . sodium chloride 0.9 % injection 10 mL  10 mL Intracatheter PRN Rulon Eisenmenger, MD   10 mL at 02/14/14 1209    PHYSICAL EXAMINATION: ECOG PERFORMANCE STATUS: 1 - Symptomatic but completely ambulatory  Filed Vitals:   02/14/14 0905  BP: 100/52  Pulse: 79  Temp: 98 F (36.7 C)  Resp: 18   Filed Weights   02/14/14 0905  Weight: 149 lb 9.6 oz (67.858 kg)    GENERAL:alert, no distress and comfortable SKIN: skin color, texture, turgor are normal, no rashes or significant lesions EYES: normal, Conjunctiva are pink and non-injected, sclera clear OROPHARYNX:no exudate, no erythema and lips, buccal mucosa, and tongue normal  NECK: supple, thyroid normal size, non-tender, without nodularity LYMPH:  no palpable lymphadenopathy in the cervical, axillary or inguinal LUNGS: clear to auscultation and percussion with normal breathing effort HEART: regular rate & rhythm and no murmurs and no lower extremity edema ABDOMEN:abdomen soft, non-tender and normal bowel sounds Musculoskeletal:no cyanosis of digits and no clubbing  NEURO: alert & oriented x 3 with fluent speech, no focal motor/sensory deficits   LABORATORY DATA:  I have reviewed the data as listed   Chemistry      Component Value Date/Time   NA 138 02/14/2014 0837   NA 143 01/10/2014 1045   K 4.0 02/14/2014 0837   K 4.0 01/10/2014 1045   CL 105 01/10/2014 1045   CO2 25 02/14/2014 0837   CO2 27  01/10/2014 1045   BUN 15.1 02/14/2014 0837   BUN 13 01/10/2014 1045   CREATININE 0.8 02/14/2014 0837   CREATININE 0.82 01/10/2014 1045      Component Value Date/Time   CALCIUM 8.9 02/14/2014 0837   CALCIUM 9.4 01/10/2014 1045   ALKPHOS 57 02/14/2014 0837   ALKPHOS 68 01/10/2014 1045   AST 16 02/14/2014 0837   AST 31 01/10/2014 1045   ALT 23 02/14/2014 0837   ALT 35 01/10/2014 1045   BILITOT 0.33 02/14/2014 0837   BILITOT <0.2* 01/10/2014 1045       Lab Results  Component Value Date   WBC 3.3* 02/14/2014   HGB 11.0* 02/14/2014   HCT 33.1* 02/14/2014   MCV 100.6 02/14/2014   PLT 164 02/14/2014   NEUTROABS 2.3 02/14/2014     RADIOGRAPHIC STUDIES: I have personally reviewed the radiology reports and agreed with their findings. No results found.  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 We are watching her very closely for development of neuropathy. Because she is an avid tennis that we do not want to limit her activities, hence we are watching her very closely with weekly followups.  Neuropathy: She notices in the tips of her fingers on her left hand. It is not significant enough to warrant any change in dosage at this time. But if this continues or gets worse, we might have to decrease the dosage of  Taxol.  Followup in one week with Mendel Ryder as a shared visit.     No orders of the defined types were placed in this encounter.   The patient has a good understanding of the overall plan. she agrees with it. She will call with any problems that may develop before her next visit here.  I spent 25 minutes counseling the patient face to face. The total time spent in the appointment was 30 minutes and more than 50% was on counseling and review of test results    Rulon Eisenmenger, MD 02/14/2014 12:30  PM

## 2014-02-16 ENCOUNTER — Telehealth: Payer: Self-pay | Admitting: Adult Health

## 2014-02-16 NOTE — Telephone Encounter (Signed)
Lvm advising appt chg on 10/26 from [facility] to HF @ 8.15 due to [facility] out of office. Asked pt to pick up revised calendar at next appt on 9/21.

## 2014-02-21 ENCOUNTER — Ambulatory Visit (HOSPITAL_BASED_OUTPATIENT_CLINIC_OR_DEPARTMENT_OTHER): Payer: Commercial Managed Care - HMO | Admitting: Adult Health

## 2014-02-21 ENCOUNTER — Ambulatory Visit (HOSPITAL_BASED_OUTPATIENT_CLINIC_OR_DEPARTMENT_OTHER): Payer: Commercial Managed Care - HMO

## 2014-02-21 ENCOUNTER — Other Ambulatory Visit (HOSPITAL_BASED_OUTPATIENT_CLINIC_OR_DEPARTMENT_OTHER): Payer: Commercial Managed Care - HMO

## 2014-02-21 ENCOUNTER — Encounter: Payer: Self-pay | Admitting: Adult Health

## 2014-02-21 VITALS — BP 99/56 | HR 75 | Temp 97.9°F | Resp 18 | Ht 66.0 in | Wt 151.0 lb

## 2014-02-21 DIAGNOSIS — C50211 Malignant neoplasm of upper-inner quadrant of right female breast: Secondary | ICD-10-CM

## 2014-02-21 DIAGNOSIS — C50919 Malignant neoplasm of unspecified site of unspecified female breast: Secondary | ICD-10-CM

## 2014-02-21 DIAGNOSIS — D649 Anemia, unspecified: Secondary | ICD-10-CM

## 2014-02-21 DIAGNOSIS — Z5111 Encounter for antineoplastic chemotherapy: Secondary | ICD-10-CM

## 2014-02-21 DIAGNOSIS — Z171 Estrogen receptor negative status [ER-]: Secondary | ICD-10-CM

## 2014-02-21 LAB — CBC WITH DIFFERENTIAL/PLATELET
BASO%: 0.7 % (ref 0.0–2.0)
Basophils Absolute: 0 10*3/uL (ref 0.0–0.1)
EOS%: 1.9 % (ref 0.0–7.0)
Eosinophils Absolute: 0.1 10*3/uL (ref 0.0–0.5)
HEMATOCRIT: 29.6 % — AB (ref 34.8–46.6)
HGB: 9.9 g/dL — ABNORMAL LOW (ref 11.6–15.9)
LYMPH%: 24.2 % (ref 14.0–49.7)
MCH: 33.7 pg (ref 25.1–34.0)
MCHC: 33.4 g/dL (ref 31.5–36.0)
MCV: 101.2 fL — AB (ref 79.5–101.0)
MONO#: 0.2 10*3/uL (ref 0.1–0.9)
MONO%: 5.5 % (ref 0.0–14.0)
NEUT#: 2.3 10*3/uL (ref 1.5–6.5)
NEUT%: 67.7 % (ref 38.4–76.8)
PLATELETS: 144 10*3/uL — AB (ref 145–400)
RBC: 2.92 10*6/uL — ABNORMAL LOW (ref 3.70–5.45)
RDW: 14.1 % (ref 11.2–14.5)
WBC: 3.4 10*3/uL — ABNORMAL LOW (ref 3.9–10.3)
lymph#: 0.8 10*3/uL — ABNORMAL LOW (ref 0.9–3.3)

## 2014-02-21 LAB — COMPREHENSIVE METABOLIC PANEL (CC13)
ALT: 23 U/L (ref 0–55)
ANION GAP: 9 meq/L (ref 3–11)
AST: 20 U/L (ref 5–34)
Albumin: 3.2 g/dL — ABNORMAL LOW (ref 3.5–5.0)
Alkaline Phosphatase: 56 U/L (ref 40–150)
BUN: 13.2 mg/dL (ref 7.0–26.0)
CO2: 24 meq/L (ref 22–29)
CREATININE: 0.8 mg/dL (ref 0.6–1.1)
Calcium: 8.9 mg/dL (ref 8.4–10.4)
Chloride: 108 mEq/L (ref 98–109)
Glucose: 92 mg/dl (ref 70–140)
Potassium: 4.1 mEq/L (ref 3.5–5.1)
Sodium: 140 mEq/L (ref 136–145)
Total Bilirubin: 0.31 mg/dL (ref 0.20–1.20)
Total Protein: 6 g/dL — ABNORMAL LOW (ref 6.4–8.3)

## 2014-02-21 MED ORDER — SODIUM CHLORIDE 0.9 % IV SOLN
Freq: Once | INTRAVENOUS | Status: AC
  Administered 2014-02-21: 15:00:00 via INTRAVENOUS

## 2014-02-21 MED ORDER — SODIUM CHLORIDE 0.9 % IV SOLN
800.0000 mg/m2 | Freq: Once | INTRAVENOUS | Status: AC
  Administered 2014-02-21: 1444 mg via INTRAVENOUS
  Filled 2014-02-21: qty 37.98

## 2014-02-21 MED ORDER — PROCHLORPERAZINE MALEATE 10 MG PO TABS
10.0000 mg | ORAL_TABLET | Freq: Once | ORAL | Status: AC
  Administered 2014-02-21: 10 mg via ORAL

## 2014-02-21 MED ORDER — HEPARIN SOD (PORK) LOCK FLUSH 100 UNIT/ML IV SOLN
500.0000 [IU] | Freq: Once | INTRAVENOUS | Status: AC | PRN
  Administered 2014-02-21: 500 [IU]
  Filled 2014-02-21: qty 5

## 2014-02-21 MED ORDER — PROCHLORPERAZINE MALEATE 10 MG PO TABS
ORAL_TABLET | ORAL | Status: AC
  Filled 2014-02-21: qty 1

## 2014-02-21 MED ORDER — SODIUM CHLORIDE 0.9 % IJ SOLN
10.0000 mL | INTRAMUSCULAR | Status: DC | PRN
  Administered 2014-02-21: 10 mL
  Filled 2014-02-21: qty 10

## 2014-02-21 NOTE — Patient Instructions (Signed)

## 2014-02-21 NOTE — Progress Notes (Addendum)
Patient Care Team: Provider Not In System as PCP - General  DIAGNOSIS: Breast cancer of upper-inner quadrant of right female breast   Primary site: Breast (Right)   Staging method: AJCC 7th Edition   Clinical: Stage IA (T1c, NX, cM0) signed by [name], MD on 08/10/2013  4:11 PM   Summary: Stage IA (T1c, NX, cM0)   Prognostic indicators: ER 0%; PR 0% Her2Neu negative, Ki-67 13%   SUMMARY OF ONCOLOGIC HISTORY:   Breast cancer of upper-inner quadrant of right female breast   08/03/2013 Initial Diagnosis Breast cancer of upper-inner quadrant of right female breast; invasive ductal carcinoma with ductal carcinoma in situ. The tumor was ER negative PR negative HER-2/neu negative with a proliferation marker Ki-67 13% ([place])   09/07/2013 Surgery Bilateral Mastectomy and SLN biopsy (Dr.Haywood) Left Breast: Benign; Right Breast IDC with DCIS Grade 3 3 SLN neg ER 0%; PR 0%; Her2: ratio 1.4 (Neg)   09/27/2013 - 01/10/2014 Chemotherapy FEC adjuvant chemo    Cancer of right breast    CHIEF COMPLIANT: Patient is here for week 4 of Taxol adjuvant chemotherapy  INTERVAL HISTORY: [name] is a 68 year old Caucasian lady with above-mentioned history of triple negative breast cancer was treated with surgery with bilateral mastectomies and has received FEC chemotherapy and is now on weekly Taxol treatments. She only received Carboplatin x 1 week and due for concern over neuropathy, the carboplatin was dropped from her regimen.  She is now receiving taxol alone and today is week 4.  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  She still continues to have numbness on her left great toe, which has been more longstanding and is unchanged.  She denies any motor changes or deficits due to the neuropathy.  She does continue to have taste changes and is having difficulty with eating due to this.  She does continue to force herself to eat, and is drinking a food  lion brand supplement drink that has more protein and calories than ensure.     REVIEW OF SYSTEMS:   A 10 point review of systems was conducted and is otherwise negative except for what is noted above.     Past Medical History  Diagnosis Date  . Family history of anesthesia complication     "daughter PONV; long time in recovery" (09/07/2013)  . Asthma     as a child  . Asthmatic bronchitis     "about q yr" (09/07/2013)  . Pneumonia     "several times; usually follows asthmatic bronchitis" (09/07/2013)  . Sinus headache     "maybe 2-3 times/month"  . Breast cancer     "right breast"    Past Surgical History  Procedure Laterality Date  . Tubal ligation  1977  . Finger fracture surgery Left 2010    "middle finger spiral fx; little finger broken; tissue damage"  . Tonsillectomy    . Portacath placement Right 09/07/2013  . Mastectomy complete / simple w/ sentinel node biopsy Right 09/07/2013  . Mastectomy complete / simple  09/07/2013  . Breast biopsy Right 07/2013  . Dilation and curettage of uterus      S/P miscarriage  . Wrist fracture surgery Left 2010  . Mastectomy w/ sentinel node biopsy Bilateral 09/07/2013    Procedure: BILATERAL MASTECTOMY WITH RIGHT SENTINEL LYMPH NODE BIOPSY;  Surgeon: [name], MD;  Location: [facility];  Service: General;  Laterality: Bilateral;  .  Portacath placement Right 09/07/2013    Procedure: INSERTION PORT-A-CATH;  Surgeon: [name], MD;  Location: [facility];  Service: General;  Laterality: Right;   Family History  Problem Relation Age of Onset  . Bladder Cancer Sister 48    worked in [employer], around 2nd hand smoke all her life  . Diabetes Sister   . Colon cancer Brother 75    non-smoker  . Diabetes Brother   . Colon cancer Maternal Uncle 31  . Osteoporosis Mother   . Parkinson's disease Mother   . Diabetes Mother   . Rheum arthritis Father   . Diabetes Father   . Breast cancer Paternal Aunt     dx over 38  . Diabetes Maternal Grandmother    . Diabetes Maternal Grandfather   . Uterine cancer Other 50  . Colon cancer Maternal Aunt     dx in her 51s-70s  . Breast cancer Cousin     paternal cousin dx in her 44s-70s     ALLERGIES:  has No Known Allergies.  MEDICATIONS:  Current Outpatient Prescriptions  Medication Sig Dispense Refill  . b complex vitamins tablet Take 1 tablet by mouth daily.      . Calcium Carbonate-Vitamin D (CALCIUM PLUS VITAMIN D PO) Take 1 tablet by mouth 2 (two) times daily.      Marland Kitchen dexamethasone (DECADRON) 4 MG tablet Take 2 tablets by mouth once a day on the day after chemotherapy and then take 2 tablets two times a day for 2 days. Take with food.  30 tablet  1  . lidocaine-prilocaine (EMLA) cream Apply 1 application topically as needed.  30 g  1  . LORazepam (ATIVAN) 0.5 MG tablet TAKE ONE TABLET EVERY 6 HOURS AS NEEDED FOR NAUSEA AND VOMITING  30 tablet  0  . omeprazole (PRILOSEC) 40 MG capsule Take 1 capsule (40 mg total) by mouth daily.  30 capsule  5  . ondansetron (ZOFRAN) 8 MG tablet TAKE ONE TABLET TWICE DAILY AS NEEDED *START ON THE 3RD DAY AFTER CHEMOTHERAPY*  30 tablet  1  . prochlorperazine (COMPAZINE) 10 MG tablet TAKE ONE TABLET EVERY 6 HOURS AS NEEDED FOR NAUSEA AND VOMITING  30 tablet  1  . UNABLE TO FIND [id number] Post mastectomy camisole QTY: 2  Diagnosis Code 174.9  2 each  3  . Alum & Mag Hydroxide-Simeth (MAGIC MOUTHWASH) SOLN SWISH AND spit ONE TEASPOONFUL BY MOUTH FOUR TIMES DAILY AS NEEDED.  240 mL  1  . lidocaine (XYLOCAINE) 2 % solution Use as directed 5 mLs in the mouth or throat 4 (four) times daily as needed for mouth pain (use with current supply of MMW).  120 mL  1  . venlafaxine (EFFEXOR) 37.5 MG tablet Take 1 tablet (37.5 mg total) by mouth 2 (two) times daily.  60 tablet  0   No current facility-administered medications for this visit.    PHYSICAL EXAMINATION: ECOG PERFORMANCE STATUS: 1 - Symptomatic but completely ambulatory  Filed Vitals:   02/21/14 0930  BP: 99/56   Pulse: 75  Temp: 97.9 F (36.6 C)  Resp: 18   Filed Weights   02/21/14 0930  Weight: 151 lb (68.493 kg)   GENERAL: Patient is a well appearing female in no acute distress HEENT:  Sclerae anicteric.  Oropharynx clear and moist. No ulcerations or evidence of oropharyngeal candidiasis. Neck is supple.  NODES:  No cervical, supraclavicular, or axillary lymphadenopathy palpated.  BREAST EXAM:  Deferred. LUNGS:  Clear to auscultation  bilaterally.  No wheezes or rhonchi. HEART:  Regular rate and rhythm. No murmur appreciated. ABDOMEN:  Soft, nontender.  Positive, normoactive bowel sounds. No organomegaly palpated. MSK:  No focal spinal tenderness to palpation. Full range of motion bilaterally in the upper extremities. EXTREMITIES:  No peripheral edema.   SKIN:  Clear with no obvious rashes or skin changes. No nail dyscrasia. NEURO:  Nonfocal. Well oriented.  Appropriate affect.     LABORATORY DATA:  I have reviewed the data as listed   Chemistry      Component Value Date/Time   NA 140 02/21/2014 0917   NA 143 01/10/2014 1045   K 4.1 02/21/2014 0917   K 4.0 01/10/2014 1045   CL 105 01/10/2014 1045   CO2 24 02/21/2014 0917   CO2 27 01/10/2014 1045   BUN 13.2 02/21/2014 0917   BUN 13 01/10/2014 1045   CREATININE 0.8 02/21/2014 0917   CREATININE 0.82 01/10/2014 1045      Component Value Date/Time   CALCIUM 8.9 02/21/2014 0917   CALCIUM 9.4 01/10/2014 1045   ALKPHOS 56 02/21/2014 0917   ALKPHOS 68 01/10/2014 1045   AST 20 02/21/2014 0917   AST 31 01/10/2014 1045   ALT 23 02/21/2014 0917   ALT 35 01/10/2014 1045   BILITOT 0.31 02/21/2014 0917   BILITOT <0.2* 01/10/2014 1045       Lab Results  Component Value Date   WBC 3.4* 02/21/2014   HGB 9.9* 02/21/2014   HCT 29.6* 02/21/2014   MCV 101.2* 02/21/2014   PLT 144* 02/21/2014   NEUTROABS 2.3 02/21/2014     RADIOGRAPHIC STUDIES: I have personally reviewed the radiology reports and agreed with their findings. No results found.    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 Due to difficulty with tolerating the treatment, she received Paclitaxel alone during week two and week three.  She did develop increased neuropathy and it was discontinued.  She will receive Gemcitabine weekly x 9 weeks.     PLAN:   Jahleah is doing well today.  Her labs remain stable and I did review this with her in detail.  She continues to have a mild treatment related anemia that we will continue to monitor.  She does have a now constant neuropathy.  She met with Dr. Lindi Adie.  She will not receive any further therapy with Taxol. She will be switched to Gemcitabine weekly.  I reviewed this chemotherapy with her in detail and gave her information in her AVS.  I did contact elizabeth in managed care to obtain prior authorization regarding this.    The patient has a good understanding of the overall plan. she agrees with it. She will call with any problems that may develop before her next visit here.  I spent 25 minutes counseling the patient face to face. The total time spent in the appointment was 30 minutes and more than 50% was on counseling and review of test results  Minette Headland, [place] [phone] 02/21/2014 9:57 AM  Attending Note  I personally saw and examined [name]. The plan of care was discussed with her. I agree with the assessment and plan as documented above. Ms. Grosvenor has already developed neuropathy and we discussed different  treatment options and determine the weekly gemcitabine may be her best option. There has been extensive neoadjuvant data showing that gemcitabine added to other chemotherapy regimens is effective especially in negative  breast cancers. We will obtain insurance approval and start chemotherapy with weekly gemcitabine. I discussed risks and benefits of chemotherapy with gemcitabine including cytopenias and she is agreeable to proceed with treatment. We will await to see if her appetite and her neuropathy improved since we are discontinuing taxanes. Signed Rulon Eisenmenger, MD

## 2014-02-21 NOTE — Patient Instructions (Addendum)
[facility] Discharge Instructions for Patients Receiving Chemotherapy  Today you received the following chemotherapy agents: Gemzar.  To help prevent nausea and vomiting after your treatment, we encourage you to take your nausea medication as prescribed.   If you develop nausea and vomiting that is not controlled by your nausea medication, call the clinic.   BELOW ARE SYMPTOMS THAT SHOULD BE REPORTED IMMEDIATELY:  *FEVER GREATER THAN 100.5 F  *CHILLS WITH OR WITHOUT FEVER  NAUSEA AND VOMITING THAT IS NOT CONTROLLED WITH YOUR NAUSEA MEDICATION  *UNUSUAL SHORTNESS OF BREATH  *UNUSUAL BRUISING OR BLEEDING  TENDERNESS IN MOUTH AND THROAT WITH OR WITHOUT PRESENCE OF ULCERS  *URINARY PROBLEMS  *BOWEL PROBLEMS  UNUSUAL RASH Items with * indicate a potential emergency and should be followed up as soon as possible.  Feel free to call the clinic you have any questions or concerns. The clinic phone number is [phone].   Gemcitabine injection What is this medicine? GEMCITABINE (jem SIT a been) is a chemotherapy drug. This medicine is used to treat many types of cancer like breast cancer, lung cancer, pancreatic cancer, and ovarian cancer. This medicine may be used for other purposes; ask your health care provider or pharmacist if you have questions. COMMON BRAND NAME(S): Gemzar What should I tell my health care provider before I take this medicine? They need to know if you have any of these conditions: -blood disorders -infection -kidney disease -liver disease -recent or ongoing radiation therapy -an unusual or allergic reaction to gemcitabine, other chemotherapy, other medicines, foods, dyes, or preservatives -pregnant or trying to get pregnant -breast-feeding How should I use this medicine? This drug is given as an infusion into a vein. It is administered in a hospital or clinic by a specially trained health care professional. Talk to your pediatrician  regarding the use of this medicine in children. Special care may be needed. Overdosage: If you think you have taken too much of this medicine contact a poison control center or emergency room at once. NOTE: This medicine is only for you. Do not share this medicine with others. What if I miss a dose? It is important not to miss your dose. Call your doctor or health care professional if you are unable to keep an appointment. What may interact with this medicine? -medicines to increase blood counts like filgrastim, pegfilgrastim, sargramostim -some other chemotherapy drugs like cisplatin -vaccines Talk to your doctor or health care professional before taking any of these medicines: -acetaminophen -aspirin -ibuprofen -ketoprofen -naproxen This list may not describe all possible interactions. Give your health care provider a list of all the medicines, herbs, non-prescription drugs, or dietary supplements you use. Also tell them if you smoke, drink alcohol, or use illegal drugs. Some items may interact with your medicine. What should I watch for while using this medicine? Visit your doctor for checks on your progress. This drug may make you feel generally unwell. This is not uncommon, as chemotherapy can affect healthy cells as well as cancer cells. Report any side effects. Continue your course of treatment even though you feel ill unless your doctor tells you to stop. In some cases, you may be given additional medicines to help with side effects. Follow all directions for their use. Call your doctor or health care professional for advice if you get a fever, chills or sore throat, or other symptoms of a cold or flu. Do not treat yourself. This drug decreases your body's ability to fight infections. Try to   avoid being around people who are sick. This medicine may increase your risk to bruise or bleed. Call your doctor or health care professional if you notice any unusual bleeding. Be careful brushing  and flossing your teeth or using a toothpick because you may get an infection or bleed more easily. If you have any dental work done, tell your dentist you are receiving this medicine. Avoid taking products that contain aspirin, acetaminophen, ibuprofen, naproxen, or ketoprofen unless instructed by your doctor. These medicines may hide a fever. Women should inform their doctor if they wish to become pregnant or think they might be pregnant. There is a potential for serious side effects to an unborn child. Talk to your health care professional or pharmacist for more information. Do not breast-feed an infant while taking this medicine. What side effects may I notice from receiving this medicine? Side effects that you should report to your doctor or health care professional as soon as possible: -allergic reactions like skin rash, itching or hives, swelling of the face, lips, or tongue -low blood counts - this medicine may decrease the number of white blood cells, red blood cells and platelets. You may be at increased risk for infections and bleeding. -signs of infection - fever or chills, cough, sore throat, pain or difficulty passing urine -signs of decreased platelets or bleeding - bruising, pinpoint red spots on the skin, black, tarry stools, blood in the urine -signs of decreased red blood cells - unusually weak or tired, fainting spells, lightheadedness -breathing problems -chest pain -mouth sores -nausea and vomiting -pain, swelling, redness at site where injected -pain, tingling, numbness in the hands or feet -stomach pain -swelling of ankles, feet, hands -unusual bleeding Side effects that usually do not require medical attention (report to your doctor or health care professional if they continue or are bothersome): -constipation -diarrhea -hair loss -loss of appetite -stomach upset This list may not describe all possible side effects. Call your doctor for medical advice about side  effects. You may report side effects to FDA at 1-800-FDA-1088. Where should I keep my medicine? This drug is given in a hospital or clinic and will not be stored at home. NOTE: This sheet is a summary. It may not cover all possible information. If you have questions about this medicine, talk to your doctor, pharmacist, or health care provider.  2015, [employer]. (2007-09-29 18:45:54)  

## 2014-02-22 ENCOUNTER — Telehealth: Payer: Self-pay | Admitting: *Deleted

## 2014-02-22 NOTE — Telephone Encounter (Signed)
Called Wendy [name] at [facility]) [phone].  Message left requesting a return call for chemotherapy follow up.  Awaiting return call from patient.

## 2014-02-22 NOTE — Telephone Encounter (Signed)
Message copied by Cherylynn Ridges on Tue Feb 22, 2014  1:28 PM ------      Message from: Adalberto Cole      Created: Mon Feb 21, 2014  3:57 PM      Regarding: new chemo      Contact: [phone]       1st gemzar- not sure if call was put in, could be duplicate       ------

## 2014-02-24 ENCOUNTER — Telehealth: Payer: Self-pay | Admitting: *Deleted

## 2014-02-24 ENCOUNTER — Ambulatory Visit (HOSPITAL_BASED_OUTPATIENT_CLINIC_OR_DEPARTMENT_OTHER): Payer: Commercial Managed Care - HMO | Admitting: Nurse Practitioner

## 2014-02-24 VITALS — BP 104/66 | HR 79 | Temp 98.3°F | Resp 20 | Ht 66.0 in | Wt 152.0 lb

## 2014-02-24 DIAGNOSIS — C50219 Malignant neoplasm of upper-inner quadrant of unspecified female breast: Secondary | ICD-10-CM

## 2014-02-24 DIAGNOSIS — C50211 Malignant neoplasm of upper-inner quadrant of right female breast: Secondary | ICD-10-CM

## 2014-02-24 DIAGNOSIS — R21 Rash and other nonspecific skin eruption: Secondary | ICD-10-CM

## 2014-02-24 NOTE — Telephone Encounter (Signed)
Patient called stating that she had a rash on her arms but no itching, also with pitting to forearms. Patient received Gemzar on 9/21. Spoke with Dr. Lindi Adie and appt scheduled with Selena Lesser for 1:00. Patient verbalized understanding.

## 2014-02-28 ENCOUNTER — Other Ambulatory Visit (HOSPITAL_BASED_OUTPATIENT_CLINIC_OR_DEPARTMENT_OTHER): Payer: Commercial Managed Care - HMO

## 2014-02-28 ENCOUNTER — Telehealth: Payer: Self-pay | Admitting: Hematology and Oncology

## 2014-02-28 ENCOUNTER — Ambulatory Visit (HOSPITAL_BASED_OUTPATIENT_CLINIC_OR_DEPARTMENT_OTHER): Payer: Commercial Managed Care - HMO | Admitting: Hematology and Oncology

## 2014-02-28 ENCOUNTER — Ambulatory Visit: Payer: Commercial Managed Care - HMO

## 2014-02-28 VITALS — BP 116/57 | HR 75 | Temp 97.6°F | Resp 20 | Ht 66.0 in | Wt 151.1 lb

## 2014-02-28 DIAGNOSIS — D702 Other drug-induced agranulocytosis: Secondary | ICD-10-CM

## 2014-02-28 DIAGNOSIS — D701 Agranulocytosis secondary to cancer chemotherapy: Secondary | ICD-10-CM

## 2014-02-28 DIAGNOSIS — C50211 Malignant neoplasm of upper-inner quadrant of right female breast: Secondary | ICD-10-CM

## 2014-02-28 DIAGNOSIS — C50919 Malignant neoplasm of unspecified site of unspecified female breast: Secondary | ICD-10-CM

## 2014-02-28 DIAGNOSIS — T451X5A Adverse effect of antineoplastic and immunosuppressive drugs, initial encounter: Secondary | ICD-10-CM

## 2014-02-28 DIAGNOSIS — G622 Polyneuropathy due to other toxic agents: Secondary | ICD-10-CM

## 2014-02-28 DIAGNOSIS — Z171 Estrogen receptor negative status [ER-]: Secondary | ICD-10-CM

## 2014-02-28 DIAGNOSIS — G620 Drug-induced polyneuropathy: Secondary | ICD-10-CM | POA: Insufficient documentation

## 2014-02-28 LAB — CBC WITH DIFFERENTIAL/PLATELET
BASO%: 0.5 % (ref 0.0–2.0)
BASOS ABS: 0 10*3/uL (ref 0.0–0.1)
EOS%: 0.6 % (ref 0.0–7.0)
Eosinophils Absolute: 0 10*3/uL (ref 0.0–0.5)
HEMATOCRIT: 28.7 % — AB (ref 34.8–46.6)
HEMOGLOBIN: 9.6 g/dL — AB (ref 11.6–15.9)
LYMPH#: 0.7 10*3/uL — AB (ref 0.9–3.3)
LYMPH%: 49 % (ref 14.0–49.7)
MCH: 33.8 pg (ref 25.1–34.0)
MCHC: 33.6 g/dL (ref 31.5–36.0)
MCV: 100.8 fL (ref 79.5–101.0)
MONO#: 0.3 10*3/uL (ref 0.1–0.9)
MONO%: 21 % — ABNORMAL HIGH (ref 0.0–14.0)
NEUT#: 0.4 10*3/uL — CL (ref 1.5–6.5)
NEUT%: 28.9 % — ABNORMAL LOW (ref 38.4–76.8)
Platelets: 150 10*3/uL (ref 145–400)
RBC: 2.84 10*6/uL — ABNORMAL LOW (ref 3.70–5.45)
RDW: 14.3 % (ref 11.2–14.5)
WBC: 1.5 10*3/uL — ABNORMAL LOW (ref 3.9–10.3)

## 2014-02-28 LAB — COMPREHENSIVE METABOLIC PANEL (CC13)
ALBUMIN: 3.2 g/dL — AB (ref 3.5–5.0)
ALT: 19 U/L (ref 0–55)
ANION GAP: 8 meq/L (ref 3–11)
AST: 19 U/L (ref 5–34)
Alkaline Phosphatase: 62 U/L (ref 40–150)
BUN: 12 mg/dL (ref 7.0–26.0)
CALCIUM: 9.4 mg/dL (ref 8.4–10.4)
CHLORIDE: 107 meq/L (ref 98–109)
CO2: 27 mEq/L (ref 22–29)
Creatinine: 0.8 mg/dL (ref 0.6–1.1)
GLUCOSE: 88 mg/dL (ref 70–140)
POTASSIUM: 3.8 meq/L (ref 3.5–5.1)
Sodium: 142 mEq/L (ref 136–145)
Total Bilirubin: 0.3 mg/dL (ref 0.20–1.20)
Total Protein: 6.4 g/dL (ref 6.4–8.3)

## 2014-02-28 NOTE — Progress Notes (Signed)
Patient Care Team: Provider Not In System as PCP - General  DIAGNOSIS: Breast cancer of upper-inner quadrant of right female breast   Primary site: Breast (Right)   Staging method: AJCC 7th Edition   Clinical: Stage IA (T1c, NX, cM0) signed by [name], MD on 08/10/2013  4:11 PM   Summary: Stage IA (T1c, NX, cM0)   Prognostic indicators: ER 0%; PR 0% Her2Neu negative, Ki-67 13%   SUMMARY OF ONCOLOGIC HISTORY:   Breast cancer of upper-inner quadrant of right female breast   08/03/2013 Initial Diagnosis Breast cancer of upper-inner quadrant of right female breast; invasive ductal carcinoma with ductal carcinoma in situ. 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 She is here for week 2 of Gemzar. After the week one, she developed a rash in her hands. She was prescribed Benadryl by a nurse practitioner. The rash is slowly fading away but she did not have any itching associated with it. She wants to know if she can discontinue it. After Gemzar, she developed profound loss of appetite for 2 days. Appetite is recovering now. She has dry mouth and poor taste.   REVIEW OF SYSTEMS:   Constitutional: Denies fevers, chills or abnormal weight loss Eyes: Denies blurriness of vision Ears, nose, mouth, throat, and face: Denies mucositis or sore throat, loss of taste Respiratory: Denies cough, dyspnea or wheezes Cardiovascular: Denies palpitation, chest  discomfort or lower extremity swelling Gastrointestinal:  Denies nausea, heartburn or change in bowel habits Skin: Macular skin rash on the hands Lymphatics: Denies new lymphadenopathy or easy bruising Neurological:Denies numbness, tingling or new weaknesses Behavioral/Psych: Mood is stable, no new changes  Breast: Bilateral mastectomies All other systems were reviewed with the patient and are negative.  I have reviewed the past medical history, past surgical history, social history and family history with the patient and they are unchanged from previous note.  ALLERGIES:  has No Known Allergies.  MEDICATIONS:  Current Outpatient Prescriptions  Medication Sig Dispense Refill  . Alum & Mag Hydroxide-Simeth (MAGIC MOUTHWASH) SOLN SWISH AND spit ONE TEASPOONFUL BY MOUTH FOUR TIMES DAILY AS NEEDED.  240 mL  1  . b complex vitamins tablet Take 1 tablet by mouth daily.      . Calcium Carbonate-Vitamin D (CALCIUM PLUS VITAMIN D PO) Take 1 tablet by mouth 2 (two) times daily.      Marland Kitchen dexamethasone (DECADRON) 4 MG tablet Take 2 tablets by mouth once a day on the day after chemotherapy and then take 2 tablets two times a day for 2 days. Take with food.  30 tablet  1  . diphenhydrAMINE (BENADRYL) 25 mg capsule Take 25 mg by mouth 3 (three) times daily.      . famotidine (PEPCID) 20 MG tablet Take 20 mg by mouth 2 (two) times daily.      Marland Kitchen lidocaine (XYLOCAINE) 2 % solution Use as directed 5 mLs in the mouth or throat 4 (four) times daily as  needed for mouth pain (use with current supply of MMW).  120 mL  1  . lidocaine-prilocaine (EMLA) cream Apply 1 application topically as needed.  30 g  1  . LORazepam (ATIVAN) 0.5 MG tablet TAKE ONE TABLET EVERY 6 HOURS AS NEEDED FOR NAUSEA AND VOMITING  30 tablet  0  . omeprazole (PRILOSEC) 40 MG capsule Take 1 capsule (40 mg total) by mouth daily.  30 capsule  5  . ondansetron (ZOFRAN) 8 MG tablet TAKE ONE TABLET TWICE DAILY AS NEEDED *START ON THE 3RD DAY AFTER  CHEMOTHERAPY*  30 tablet  1  . prochlorperazine (COMPAZINE) 10 MG tablet TAKE ONE TABLET EVERY 6 HOURS AS NEEDED FOR NAUSEA AND VOMITING  30 tablet  1  . venlafaxine (EFFEXOR) 37.5 MG tablet Take 1 tablet (37.5 mg total) by mouth 2 (two) times daily.  60 tablet  0   No current facility-administered medications for this visit.    PHYSICAL EXAMINATION: ECOG PERFORMANCE STATUS: 1 - Symptomatic but completely ambulatory  Filed Vitals:   02/28/14 0853  BP: 116/57  Pulse: 75  Temp: 97.6 F (36.4 C)  Resp: 20   Filed Weights   02/28/14 0853  Weight: 151 lb 1.6 oz (68.539 kg)    GENERAL:alert, no distress and comfortable SKIN: Rash on the hands is improving EYES: normal, Conjunctiva are pink and non-injected, sclera clear OROPHARYNX:no exudate, no erythema and lips, buccal mucosa, and tongue normal  NECK: supple, thyroid normal size, non-tender, without nodularity LYMPH:  no palpable lymphadenopathy in the cervical, axillary or inguinal LUNGS: clear to auscultation and percussion with normal breathing effort HEART: regular rate & rhythm and no murmurs and no lower extremity edema ABDOMEN:abdomen soft, non-tender and normal bowel sounds Musculoskeletal:no cyanosis of digits and no clubbing  NEURO: alert & oriented x 3 with fluent speech, no focal motor/sensory deficits  LABORATORY DATA:  I have reviewed the data as listed   Chemistry      Component Value Date/Time   NA 142 02/28/2014 0840   NA 143 01/10/2014 1045   K 3.8 02/28/2014 0840   K 4.0 01/10/2014 1045   CL 105 01/10/2014 1045   CO2 27 02/28/2014 0840   CO2 27 01/10/2014 1045   BUN 12.0 02/28/2014 0840   BUN 13 01/10/2014 1045   CREATININE 0.8 02/28/2014 0840   CREATININE 0.82 01/10/2014 1045      Component Value Date/Time   CALCIUM 9.4 02/28/2014 0840   CALCIUM 9.4 01/10/2014 1045   ALKPHOS 62 02/28/2014 0840   ALKPHOS 68 01/10/2014 1045   AST 19 02/28/2014 0840   AST 31 01/10/2014 1045   ALT 19 02/28/2014 0840   ALT 35  01/10/2014 1045   BILITOT 0.30 02/28/2014 0840   BILITOT <0.2* 01/10/2014 1045       Lab Results  Component Value Date   WBC 1.5* 02/28/2014   HGB 9.6* 02/28/2014   HCT 28.7* 02/28/2014   MCV 100.8 02/28/2014   PLT 150 02/28/2014   NEUTROABS 0.4* 02/28/2014     RADIOGRAPHIC STUDIES: I have personally reviewed the radiology reports and agreed with their findings. No results found.   ASSESSMENT & PLAN:  Breast cancer of upper-inner quadrant of right female breast Right breast cancer triple negative disease status post bilateral mastectomy and adjuvant chemotherapy with FEC followed by weekly Taxol and carboplatin x1, Taxol x2, Gemzar x1 Because the patient was unable to tolerate any of the taxanes, and developed neutropenia to Gemzar along with profound weakness and  loss of appetite, we elected to discontinue treatment and will watch her and do surveillance.  Patient and her husband understand the reasoning and are in agreement with the plan. Portl will be removed in about 3 months.  Chemotherapy induced neutropenia Neutropenia with an absolute neutrophil count of 400. I provided her with neutropenic precautions and instructed her to call is if she notes any fevers. I did not give her Neupogen but we will see her next week to review her blood counts. I expect her to have spontaneous count recovery without any growth factors since she has completed chemotherapy.  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 Since stopping axis, her neuropathy is already starting to get better.   Orders Placed This Encounter  Procedures  . CBC with Differential    Standing Status: Future     Number of Occurrences:      Standing Expiration Date: 02/28/2015  . Comprehensive metabolic panel (Cmet) - [facility]    Standing Status: Future     Number of Occurrences:      Standing Expiration Date: 02/28/2015   The patient has a good  understanding of the overall plan. she agrees with it. She will call with any problems that may develop before her next visit here.  I spent 25 minutes counseling the patient face to face. The total time spent in the appointment was 30 minutes and more than 50% was on counseling and review of test results    Rulon Eisenmenger, MD 02/28/2014 9:30 AM

## 2014-02-28 NOTE — Assessment & Plan Note (Signed)
Neutropenia with an absolute neutrophil count of 400. I provided her with neutropenic precautions and instructed her to call is if she notes any fevers. I did not give her Neupogen but we will see her next week to review her blood counts. I expect her to have spontaneous count recovery without any growth factors since she has completed chemotherapy.

## 2014-02-28 NOTE — Assessment & Plan Note (Addendum)
Right breast cancer triple negative disease status post bilateral mastectomy and adjuvant chemotherapy with FEC followed by weekly Taxol and carboplatin x1, Taxol x2, Gemzar x1 Because the patient was unable to tolerate any of the taxanes, and developed neutropenia to Gemzar along with profound weakness and loss of appetite, we elected to discontinue treatment and will watch her and do surveillance.  Patient and her husband understand the reasoning and are in agreement with the plan. Portl will be removed in about 3 months.

## 2014-02-28 NOTE — Telephone Encounter (Signed)
, °

## 2014-02-28 NOTE — Assessment & Plan Note (Signed)
Peripheral neuropathy related to chemotherapy especially exams. Grade 1-2. Since stopping axis, her neuropathy is already starting to get better.

## 2014-03-01 ENCOUNTER — Telehealth: Payer: Self-pay

## 2014-03-01 NOTE — Telephone Encounter (Signed)
Rcvd office note from [facility] pt - [place] - chemo code [id number] [phone] has been approved.  Routed to [employer].  Sent to scan.

## 2014-03-02 ENCOUNTER — Encounter: Payer: Self-pay | Admitting: Nurse Practitioner

## 2014-03-02 DIAGNOSIS — R21 Rash and other nonspecific skin eruption: Secondary | ICD-10-CM | POA: Insufficient documentation

## 2014-03-02 NOTE — Assessment & Plan Note (Signed)
Patient is status post 4 cycles of Taxol chemotherapy; which was discontinued last week do to poor tolerability.  Patient initiated weekly gemcitabine on 02/21/2014.  Patient has plans to return next week for cycle 1, day 8 of the same regimen.

## 2014-03-02 NOTE — Progress Notes (Signed)
[place]   Chief Complaint  Patient presents with  . Rash    HPI: Wendy [name] 68 y.o. female diagnosed with breast cancer.  Patient is status post bilateral mastectomy; and 4 cycles of Taxol chemotherapy.  Patient initiated weekly gemcitabine chemotherapy on 02/21/2014.  Patient called the cancer Center today requesting urgent care visit.  Patient states she has developed a very fine, papular rash to her bilateral forearms and her hands.  She admits to only occasional pruritus.  She denies any other allergic symptoms whatsoever.  She denies any recent fevers or chills.   Rash    CURRENT THERAPY: Upcoming Treatment Dates - BREAST Gemcitabine D1,8,15 q28d Days with orders from any treatment category:  02/28/2014      SCHEDULING COMMUNICATION      prochlorperazine (COMPAZINE) tablet 10 mg      Gemcitabine HCl (GEMZAR) 1,444 mg in sodium chloride 0.9 % 100 mL chemo infusion      sodium chloride 0.9 % injection 10 mL      heparin lock flush 100 unit/mL      heparin lock flush 100 unit/mL      alteplase (CATHFLO ACTIVASE) injection 2 mg      sodium chloride 0.9 % injection 3 mL      Cold Pack 1 packet      0.9 %  sodium chloride infusion      TREATMENT CONDITIONS 03/07/2014      SCHEDULING COMMUNICATION      prochlorperazine (COMPAZINE) tablet 10 mg      Gemcitabine HCl (GEMZAR) 1,444 mg in sodium chloride 0.9 % 100 mL chemo infusion      sodium chloride 0.9 % injection 10 mL      heparin lock flush 100 unit/mL      heparin lock flush 100 unit/mL      alteplase (CATHFLO ACTIVASE) injection 2 mg      sodium chloride 0.9 % injection 3 mL      Cold Pack 1 packet      0.9 %  sodium chloride infusion      TREATMENT CONDITIONS 03/21/2014      SCHEDULING COMMUNICATION      prochlorperazine (COMPAZINE) tablet 10 mg      Gemcitabine HCl (GEMZAR) 1,444 mg in sodium chloride 0.9 % 100 mL chemo infusion      sodium chloride 0.9 % injection 10 mL      heparin lock  flush 100 unit/mL      heparin lock flush 100 unit/mL      alteplase (CATHFLO ACTIVASE) injection 2 mg      sodium chloride 0.9 % injection 3 mL      Cold Pack 1 packet      0.9 %  sodium chloride infusion      TREATMENT CONDITIONS    Review of Systems  Skin: Positive for rash.    Past Medical History  Diagnosis Date  . Family history of anesthesia complication     "daughter PONV; long time in recovery" (09/07/2013)  . Asthma     as a child  . Asthmatic bronchitis     "about q yr" (09/07/2013)  . Pneumonia     "several times; usually follows asthmatic bronchitis" (09/07/2013)  . Sinus headache     "maybe 2-3 times/month"  . Breast cancer     "right breast"     Past Surgical History  Procedure Laterality Date  . Tubal ligation  1977  . Finger  fracture surgery Left 2010    "middle finger spiral fx; little finger broken; tissue damage"  . Tonsillectomy    . Portacath placement Right 09/07/2013  . Mastectomy complete / simple w/ sentinel node biopsy Right 09/07/2013  . Mastectomy complete / simple  09/07/2013  . Breast biopsy Right 07/2013  . Dilation and curettage of uterus      S/P miscarriage  . Wrist fracture surgery Left 2010  . Mastectomy w/ sentinel node biopsy Bilateral 09/07/2013    Procedure: BILATERAL MASTECTOMY WITH RIGHT SENTINEL LYMPH NODE BIOPSY;  Surgeon: [name], MD;  Location: [facility];  Service: General;  Laterality: Bilateral;  . Portacath placement Right 09/07/2013    Procedure: INSERTION PORT-A-CATH;  Surgeon: [name], MD;  Location: [facility];  Service: General;  Laterality: Right;    has Breast cancer of upper-inner quadrant of right female breast; Cancer of right breast; Chemotherapy induced neutropenia; Chemotherapy-induced peripheral neuropathy; and Rash on her problem list.     has No Known Allergies.    Medication List       This list is accurate as of: 02/24/14 11:59 PM.  Always use your most recent med list.               b complex  vitamins tablet  Take 1 tablet by mouth daily.     CALCIUM PLUS VITAMIN D PO  Take 1 tablet by mouth 2 (two) times daily.     dexamethasone 4 MG tablet  Commonly known as:  DECADRON  Take 2 tablets by mouth once a day on the day after chemotherapy and then take 2 tablets two times a day for 2 days. Take with food.     lidocaine 2 % solution  Commonly known as:  XYLOCAINE  Use as directed 5 mLs in the mouth or throat 4 (four) times daily as needed for mouth pain (use with current supply of MMW).     lidocaine-prilocaine cream  Commonly known as:  EMLA  Apply 1 application topically as needed.     LORazepam 0.5 MG tablet  Commonly known as:  ATIVAN  TAKE ONE TABLET EVERY 6 HOURS AS NEEDED FOR NAUSEA AND VOMITING     magic mouthwash Soln  SWISH AND spit ONE TEASPOONFUL BY MOUTH FOUR TIMES DAILY AS NEEDED.     omeprazole 40 MG capsule  Commonly known as:  PRILOSEC  Take 1 capsule (40 mg total) by mouth daily.     ondansetron 8 MG tablet  Commonly known as:  ZOFRAN  TAKE ONE TABLET TWICE DAILY AS NEEDED *START ON THE 3RD DAY AFTER CHEMOTHERAPY*     prochlorperazine 10 MG tablet  Commonly known as:  COMPAZINE  TAKE ONE TABLET EVERY 6 HOURS AS NEEDED FOR NAUSEA AND VOMITING     venlafaxine 37.5 MG tablet  Commonly known as:  EFFEXOR  Take 1 tablet (37.5 mg total) by mouth 2 (two) times daily.         PHYSICAL EXAMINATION  Blood pressure 104/66, pulse 79, temperature 98.3 F (36.8 C), temperature source Oral, resp. rate 20, height 5\' 6"  (1.676 m), weight 152 lb (68.947 kg).  Physical Exam  Nursing note and vitals reviewed. Constitutional: She is oriented to person, place, and time and well-developed, well-nourished, and in no distress. No distress.  HENT:  Head: Normocephalic and atraumatic.  Mouth/Throat: Oropharynx is clear and moist. No oropharyngeal exudate.  Eyes: Conjunctivae are normal. Pupils are equal, round, and reactive to light. No  scleral icterus.  Neck:  Normal range of motion. Neck supple. No JVD present. No thyromegaly present.  Cardiovascular: Normal rate, regular rhythm, normal heart sounds and intact distal pulses.   Pulmonary/Chest: Effort normal and breath sounds normal. No respiratory distress. She has no wheezes.  Abdominal: Soft. Bowel sounds are normal. She exhibits no distension. There is no tenderness. There is no rebound.  Musculoskeletal: Normal range of motion. She exhibits no edema and no tenderness.  Lymphadenopathy:    She has no cervical adenopathy.  Neurological: She is alert and oriented to person, place, and time. Gait normal.  Skin: Skin is warm and dry. Rash noted. There is erythema.  Patient has a fine papular rash to her bilateral forearms and hands.  Psychiatric: Affect normal.   ASSESSMENT/PLAN:    Breast cancer of upper-inner quadrant of right female breast  Assessment & Plan Patient is status post 4 cycles of Taxol chemotherapy; which was discontinued last week do to poor tolerability.  Patient initiated weekly gemcitabine on 02/21/2014.  Patient has plans to return next week for cycle 1, day 8 of the same regimen.   Rash  Assessment & Plan Patient does have a very fine papular rash to her bilateral forearms and hands.  Patient is complaining of occasional pruritus only.  She denies any swallowing issues or airway issues.  Most likely, this is a mild reaction to her new chemotherapy regimen.  Patient was encouraged to try Benadryl 25 mg every 6 hours and Pepcid 20 mg every 12 hours.  She was also advised to call or go directed to the emergency department she develops any worsening symptoms whatsoever.   Patient stated understanding of all instructions; and was in agreement with this plan of care. The patient knows to call the clinic with any problems, questions or concerns.   Review/collaboration with Dr. Lindi Adie regarding all aspects of patient's visit today.   Total time spent with patient was 25 minutes  minutes;  with greater than 80 percent of that time spent in face to face counseling regarding her symptoms, and coordination of care and follow up.  Disclaimer: This note was dictated with voice recognition software. Similar sounding words can inadvertently be transcribed and may not be corrected upon review.   Drue Second, NP 03/02/2014

## 2014-03-02 NOTE — Assessment & Plan Note (Signed)
Patient does have a very fine papular rash to her bilateral forearms and hands.  Patient is complaining of occasional pruritus only.  She denies any swallowing issues or airway issues.  Most likely, this is a mild reaction to her new chemotherapy regimen.  Patient was encouraged to try Benadryl 25 mg every 6 hours and Pepcid 20 mg every 12 hours.  She was also advised to call or go directed to the emergency department she develops any worsening symptoms whatsoever.

## 2014-03-03 ENCOUNTER — Other Ambulatory Visit: Payer: Self-pay

## 2014-03-07 ENCOUNTER — Encounter: Payer: Self-pay | Admitting: Adult Health

## 2014-03-07 ENCOUNTER — Telehealth: Payer: Self-pay | Admitting: Adult Health

## 2014-03-07 ENCOUNTER — Ambulatory Visit: Payer: Commercial Managed Care - HMO

## 2014-03-07 ENCOUNTER — Ambulatory Visit (HOSPITAL_BASED_OUTPATIENT_CLINIC_OR_DEPARTMENT_OTHER): Payer: Commercial Managed Care - HMO | Admitting: Adult Health

## 2014-03-07 ENCOUNTER — Other Ambulatory Visit (HOSPITAL_BASED_OUTPATIENT_CLINIC_OR_DEPARTMENT_OTHER): Payer: Commercial Managed Care - HMO

## 2014-03-07 VITALS — BP 119/58 | HR 83 | Temp 97.8°F | Resp 18 | Ht 66.0 in | Wt 150.1 lb

## 2014-03-07 DIAGNOSIS — C50811 Malignant neoplasm of overlapping sites of right female breast: Secondary | ICD-10-CM

## 2014-03-07 DIAGNOSIS — G620 Drug-induced polyneuropathy: Secondary | ICD-10-CM

## 2014-03-07 DIAGNOSIS — D6481 Anemia due to antineoplastic chemotherapy: Secondary | ICD-10-CM

## 2014-03-07 DIAGNOSIS — C50211 Malignant neoplasm of upper-inner quadrant of right female breast: Secondary | ICD-10-CM

## 2014-03-07 DIAGNOSIS — Z171 Estrogen receptor negative status [ER-]: Secondary | ICD-10-CM

## 2014-03-07 DIAGNOSIS — R432 Parageusia: Secondary | ICD-10-CM

## 2014-03-07 LAB — CBC WITH DIFFERENTIAL/PLATELET
BASO%: 0.4 % (ref 0.0–2.0)
Basophils Absolute: 0 10*3/uL (ref 0.0–0.1)
EOS ABS: 0 10*3/uL (ref 0.0–0.5)
EOS%: 1 % (ref 0.0–7.0)
HCT: 33.5 % — ABNORMAL LOW (ref 34.8–46.6)
HEMOGLOBIN: 11 g/dL — AB (ref 11.6–15.9)
LYMPH#: 0.9 10*3/uL (ref 0.9–3.3)
LYMPH%: 29.8 % (ref 14.0–49.7)
MCH: 33.4 pg (ref 25.1–34.0)
MCHC: 33 g/dL (ref 31.5–36.0)
MCV: 101.3 fL — ABNORMAL HIGH (ref 79.5–101.0)
MONO#: 0.4 10*3/uL (ref 0.1–0.9)
MONO%: 12.1 % (ref 0.0–14.0)
NEUT#: 1.8 10*3/uL (ref 1.5–6.5)
NEUT%: 56.7 % (ref 38.4–76.8)
Platelets: 260 10*3/uL (ref 145–400)
RBC: 3.31 10*6/uL — AB (ref 3.70–5.45)
RDW: 15.2 % — AB (ref 11.2–14.5)
WBC: 3.2 10*3/uL — AB (ref 3.9–10.3)

## 2014-03-07 LAB — COMPREHENSIVE METABOLIC PANEL (CC13)
ALT: 22 U/L (ref 0–55)
ANION GAP: 9 meq/L (ref 3–11)
AST: 20 U/L (ref 5–34)
Albumin: 3.5 g/dL (ref 3.5–5.0)
Alkaline Phosphatase: 73 U/L (ref 40–150)
BILIRUBIN TOTAL: 0.33 mg/dL (ref 0.20–1.20)
BUN: 12.7 mg/dL (ref 7.0–26.0)
CHLORIDE: 106 meq/L (ref 98–109)
CO2: 27 mEq/L (ref 22–29)
CREATININE: 0.8 mg/dL (ref 0.6–1.1)
Calcium: 9.6 mg/dL (ref 8.4–10.4)
GLUCOSE: 102 mg/dL (ref 70–140)
POTASSIUM: 3.8 meq/L (ref 3.5–5.1)
SODIUM: 142 meq/L (ref 136–145)
Total Protein: 6.6 g/dL (ref 6.4–8.3)

## 2014-03-07 NOTE — Telephone Encounter (Signed)
, °

## 2014-03-07 NOTE — Progress Notes (Signed)
Patient Care Team: Provider Not In System as PCP - General  DIAGNOSIS: Breast cancer of upper-inner quadrant of right female breast   Primary site: Breast (Right)   Staging method: AJCC 7th Edition   Clinical: Stage IA (T1c, NX, cM0) signed by [name], MD on 08/10/2013  4:11 PM   Summary: Stage IA (T1c, NX, cM0)   Prognostic indicators: ER 0%; PR 0% Her2Neu negative, Ki-67 13%   SUMMARY OF ONCOLOGIC HISTORY:   Breast cancer of upper-inner quadrant of right female breast   08/03/2013 Initial Diagnosis Breast cancer of upper-inner quadrant of right female breast; invasive ductal carcinoma with ductal carcinoma in situ. The tumor was ER negative PR negative HER-2/neu negative with a proliferation marker Ki-67 13% ([place])   09/07/2013 Surgery Bilateral Mastectomy and SLN biopsy (Dr.Haywood) Left Breast: Benign; Right Breast IDC with DCIS Grade 3 3 SLN neg ER 0%; PR 0%; Her2: ratio 1.4 (Neg)   09/27/2013 - 01/24/2014 Chemotherapy FEC adjuvant chemo given on day 1 of a 21 day cycle with Neulasta on day 2 for granulocyte support.  A total of 6 cycles were given.     01/31/2014 - 01/31/2014 Chemotherapy Taxol Carboplatin x 1 cycle.  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    02/08/2014 - 02/15/2014 Chemotherapy Taxol alone given on 9/8 and 9/14, however discontinued due to increasing grade II peripheral neuropathy.   02/21/2014 - 02/21/2014 Chemotherapy Gemcitabine weekly given x 1, but discontinued due to neutropenia.      Cancer of right breast    CHIEF COMPLIANT: Profound loss of appetite of the Gemzar and a rash  INTERVAL HISTORY: Wendy [name] is a 68 year old Caucasian with above-mentioned history of triple negative right breast cancer treated with bilateral mastectomies. She underwent chemotherapy as above and is here today to check her lab work.  She was neutropenic on 9/28 and her Gemcitabine was discontinued.  She continues to have difficulty with the desire to eat and taste changes.  She  is eating and taking in calories every day and is maintaining her weight, she is just very unhappy with the continued dysgeusia she is experiencing.  Her numbness continues to improve.    REVIEW OF SYSTEMS:   A 10 point review of systems was conducted and is otherwise negative except for what is noted above.    Past Medical History  Diagnosis Date  . Family history of anesthesia complication     "daughter PONV; long time in recovery" (09/07/2013)  . Asthma     as a child  . Asthmatic bronchitis     "about q yr" (09/07/2013)  . Pneumonia     "several times; usually follows asthmatic bronchitis" (09/07/2013)  . Sinus headache     "maybe 2-3 times/month"  . Breast cancer     "right breast"    Past Surgical History  Procedure Laterality Date  . Tubal ligation  1977  . Finger fracture surgery Left 2010    "middle finger spiral fx; little finger broken; tissue damage"  . Tonsillectomy    . Portacath placement Right 09/07/2013  . Mastectomy complete / simple w/ sentinel node biopsy Right 09/07/2013  . Mastectomy complete / simple  09/07/2013  . Breast biopsy Right 07/2013  . Dilation and curettage of uterus      S/P miscarriage  . Wrist fracture surgery Left 2010  . Mastectomy w/ sentinel node biopsy Bilateral 09/07/2013    Procedure: BILATERAL MASTECTOMY WITH RIGHT SENTINEL LYMPH NODE BIOPSY;  Surgeon: Renelda Loma  Alyssa Grove, MD;  Location: [place];  Service: General;  Laterality: Bilateral;  . Portacath placement Right 09/07/2013    Procedure: INSERTION PORT-A-CATH;  Surgeon: [name], MD;  Location: [place];  Service: General;  Laterality: Right;   History   Social History  . Marital Status: Married    Spouse Name: N/A    Number of Children: 2  . Years of Education: N/A   Occupational History  . Not on file.   Social History Main Topics  . Smoking status: Never Smoker   . Smokeless tobacco: Never Used  . Alcohol Use: No  . Drug Use: No  . Sexual Activity: No   Other Topics Concern   . Not on file   Social History Narrative  . No narrative on file   Family History  Problem Relation Age of Onset  . Bladder Cancer Sister 49    worked in [employer], around 2nd hand smoke all her life  . Diabetes Sister   . Colon cancer Brother 55    non-smoker  . Diabetes Brother   . Colon cancer Maternal Uncle 30  . Osteoporosis Mother   . Parkinson's disease Mother   . Diabetes Mother   . Rheum arthritis Father   . Diabetes Father   . Breast cancer Paternal Aunt     dx over 26  . Diabetes Maternal Grandmother   . Diabetes Maternal Grandfather   . Uterine cancer Other 50  . Colon cancer Maternal Aunt     dx in her 69s-70s  . Breast cancer Cousin     paternal cousin dx in her 79s-70s    ALLERGIES:  has No Known Allergies.  MEDICATIONS:  Current Outpatient Prescriptions  Medication Sig Dispense Refill  . b complex vitamins tablet Take 1 tablet by mouth daily.      . Calcium Carbonate-Vitamin D (CALCIUM PLUS VITAMIN D PO) Take 1 tablet by mouth 2 (two) times daily.      . famotidine (PEPCID) 20 MG tablet Take 20 mg by mouth 2 (two) times daily.      . Alum & Mag Hydroxide-Simeth (MAGIC MOUTHWASH) SOLN SWISH AND spit ONE TEASPOONFUL BY MOUTH FOUR TIMES DAILY AS NEEDED.  240 mL  1  . dexamethasone (DECADRON) 4 MG tablet Take 2 tablets by mouth once a day on the day after chemotherapy and then take 2 tablets two times a day for 2 days. Take with food.  30 tablet  1  . diphenhydrAMINE (BENADRYL) 25 mg capsule Take 25 mg by mouth 3 (three) times daily.      Marland Kitchen lidocaine (XYLOCAINE) 2 % solution Use as directed 5 mLs in the mouth or throat 4 (four) times daily as needed for mouth pain (use with current supply of MMW).  120 mL  1  . lidocaine-prilocaine (EMLA) cream Apply 1 application topically as needed.  30 g  1  . LORazepam (ATIVAN) 0.5 MG tablet TAKE ONE TABLET EVERY 6 HOURS AS NEEDED FOR NAUSEA AND VOMITING  30 tablet  0  . omeprazole (PRILOSEC) 40 MG capsule Take 1 capsule  (40 mg total) by mouth daily.  30 capsule  5  . ondansetron (ZOFRAN) 8 MG tablet TAKE ONE TABLET TWICE DAILY AS NEEDED *START ON THE 3RD DAY AFTER CHEMOTHERAPY*  30 tablet  1  . prochlorperazine (COMPAZINE) 10 MG tablet TAKE ONE TABLET EVERY 6 HOURS AS NEEDED FOR NAUSEA AND VOMITING  30 tablet  1   No current facility-administered medications  for this visit.    PHYSICAL EXAMINATION: ECOG PERFORMANCE STATUS: 1 - Symptomatic but completely ambulatory  Filed Vitals:   03/07/14 0923  BP: 119/58  Pulse: 83  Temp: 97.8 F (36.6 C)  Resp: 18   Filed Weights   03/07/14 0923  Weight: 150 lb 1.6 oz (68.085 kg)   GENERAL: Patient is a well appearing female in no acute distress HEENT:  Sclerae anicteric.  Oropharynx clear and moist. No ulcerations or evidence of oropharyngeal candidiasis. Neck is supple.  NODES:  No cervical, supraclavicular, or axillary lymphadenopathy palpated.  BREAST EXAM:  Deferred. LUNGS:  Clear to auscultation bilaterally.  No wheezes or rhonchi. HEART:  Regular rate and rhythm. No murmur appreciated. ABDOMEN:  Soft, nontender.  Positive, normoactive bowel sounds. No organomegaly palpated. MSK:  No focal spinal tenderness to palpation. Full range of motion bilaterally in the upper extremities. EXTREMITIES:  No peripheral edema.   SKIN:  Clear with no obvious rashes or skin changes. No nail dyscrasia. NEURO:  Nonfocal. Well oriented.  Appropriate affect.    LABORATORY DATA:  I have reviewed the data as listed   Chemistry      Component Value Date/Time   NA 142 03/07/2014 0913   NA 143 01/10/2014 1045   K 3.8 03/07/2014 0913   K 4.0 01/10/2014 1045   CL 105 01/10/2014 1045   CO2 27 03/07/2014 0913   CO2 27 01/10/2014 1045   BUN 12.7 03/07/2014 0913   BUN 13 01/10/2014 1045   CREATININE 0.8 03/07/2014 0913   CREATININE 0.82 01/10/2014 1045      Component Value Date/Time   CALCIUM 9.6 03/07/2014 0913   CALCIUM 9.4 01/10/2014 1045   ALKPHOS 73 03/07/2014 0913    ALKPHOS 68 01/10/2014 1045   AST 20 03/07/2014 0913   AST 31 01/10/2014 1045   ALT 22 03/07/2014 0913   ALT 35 01/10/2014 1045   BILITOT 0.33 03/07/2014 0913   BILITOT <0.2* 01/10/2014 1045       Lab Results  Component Value Date   WBC 3.2* 03/07/2014   HGB 11.0* 03/07/2014   HCT 33.5* 03/07/2014   MCV 101.3* 03/07/2014   PLT 260 03/07/2014   NEUTROABS 1.8 03/07/2014     ASSESSMENT: Stage IA T1, N0, 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     PLAN:   Wendy [name] is doing well today.  Her lab work has recovered and she is no longer neutropenic.  I reviewed that with her in detail.  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    Wendy [name] continues to experience dysgeusia and hopefully this will resolve in a few weeks.  I will see her back in a months time to evaluate the progress of this.  It is not affecting her weight or intake.  She will also return in 3 months for labs and evaluation and every 6 weeks for a port flush.    The patient has a good understanding of the overall plan. she agrees with it. She will call with any problems that may develop before her next visit here.  I spent 25 minutes counseling the patient face to face. The total time spent in the appointment was 30 minutes and more than 50% was on counseling and review of test results   Minette Headland,  [place] [phone] 03/07/2014 1:12 PM

## 2014-03-14 ENCOUNTER — Ambulatory Visit: Payer: Commercial Managed Care - HMO | Admitting: Hematology and Oncology

## 2014-03-14 ENCOUNTER — Other Ambulatory Visit: Payer: Commercial Managed Care - HMO

## 2014-03-14 ENCOUNTER — Ambulatory Visit: Payer: Commercial Managed Care - HMO

## 2014-03-21 ENCOUNTER — Ambulatory Visit: Payer: Commercial Managed Care - HMO

## 2014-03-21 ENCOUNTER — Other Ambulatory Visit: Payer: Commercial Managed Care - HMO

## 2014-03-21 ENCOUNTER — Ambulatory Visit: Payer: Commercial Managed Care - HMO | Admitting: Hematology and Oncology

## 2014-03-21 ENCOUNTER — Ambulatory Visit: Payer: Commercial Managed Care - HMO | Admitting: Adult Health

## 2014-03-28 ENCOUNTER — Ambulatory Visit: Payer: Commercial Managed Care - HMO

## 2014-03-28 ENCOUNTER — Ambulatory Visit: Payer: Commercial Managed Care - HMO | Admitting: Hematology and Oncology

## 2014-03-28 ENCOUNTER — Ambulatory Visit: Payer: Commercial Managed Care - HMO | Admitting: Nurse Practitioner

## 2014-03-28 ENCOUNTER — Other Ambulatory Visit: Payer: Commercial Managed Care - HMO

## 2014-04-04 ENCOUNTER — Other Ambulatory Visit (HOSPITAL_BASED_OUTPATIENT_CLINIC_OR_DEPARTMENT_OTHER): Payer: Commercial Managed Care - HMO

## 2014-04-04 ENCOUNTER — Ambulatory Visit: Payer: Commercial Managed Care - HMO

## 2014-04-04 ENCOUNTER — Encounter: Payer: Self-pay | Admitting: Adult Health

## 2014-04-04 ENCOUNTER — Ambulatory Visit (HOSPITAL_BASED_OUTPATIENT_CLINIC_OR_DEPARTMENT_OTHER): Payer: Commercial Managed Care - HMO | Admitting: Adult Health

## 2014-04-04 VITALS — BP 128/55 | HR 66 | Temp 98.8°F | Resp 66 | Ht 66.0 in | Wt 146.4 lb

## 2014-04-04 DIAGNOSIS — C50211 Malignant neoplasm of upper-inner quadrant of right female breast: Secondary | ICD-10-CM

## 2014-04-04 DIAGNOSIS — Z95828 Presence of other vascular implants and grafts: Secondary | ICD-10-CM

## 2014-04-04 DIAGNOSIS — C50811 Malignant neoplasm of overlapping sites of right female breast: Secondary | ICD-10-CM

## 2014-04-04 DIAGNOSIS — Z23 Encounter for immunization: Secondary | ICD-10-CM

## 2014-04-04 DIAGNOSIS — Z171 Estrogen receptor negative status [ER-]: Secondary | ICD-10-CM

## 2014-04-04 LAB — CBC WITH DIFFERENTIAL/PLATELET
BASO%: 0.8 % (ref 0.0–2.0)
BASOS ABS: 0 10*3/uL (ref 0.0–0.1)
EOS ABS: 0.1 10*3/uL (ref 0.0–0.5)
EOS%: 1.9 % (ref 0.0–7.0)
HCT: 33.5 % — ABNORMAL LOW (ref 34.8–46.6)
HGB: 11 g/dL — ABNORMAL LOW (ref 11.6–15.9)
LYMPH%: 25.1 % (ref 14.0–49.7)
MCH: 32.5 pg (ref 25.1–34.0)
MCHC: 32.9 g/dL (ref 31.5–36.0)
MCV: 98.8 fL (ref 79.5–101.0)
MONO#: 0.3 10*3/uL (ref 0.1–0.9)
MONO%: 8.7 % (ref 0.0–14.0)
NEUT%: 63.5 % (ref 38.4–76.8)
NEUTROS ABS: 1.9 10*3/uL (ref 1.5–6.5)
Platelets: 194 10*3/uL (ref 145–400)
RBC: 3.39 10*6/uL — AB (ref 3.70–5.45)
RDW: 13.4 % (ref 11.2–14.5)
WBC: 3 10*3/uL — ABNORMAL LOW (ref 3.9–10.3)
lymph#: 0.8 10*3/uL — ABNORMAL LOW (ref 0.9–3.3)

## 2014-04-04 LAB — COMPREHENSIVE METABOLIC PANEL (CC13)
ALK PHOS: 80 U/L (ref 40–150)
ALT: 16 U/L (ref 0–55)
AST: 19 U/L (ref 5–34)
Albumin: 3.4 g/dL — ABNORMAL LOW (ref 3.5–5.0)
Anion Gap: 7 mEq/L (ref 3–11)
BILIRUBIN TOTAL: 0.32 mg/dL (ref 0.20–1.20)
BUN: 11.2 mg/dL (ref 7.0–26.0)
CO2: 24 mEq/L (ref 22–29)
Calcium: 9.2 mg/dL (ref 8.4–10.4)
Chloride: 110 mEq/L — ABNORMAL HIGH (ref 98–109)
Creatinine: 0.8 mg/dL (ref 0.6–1.1)
Glucose: 114 mg/dl (ref 70–140)
Potassium: 4 mEq/L (ref 3.5–5.1)
SODIUM: 141 meq/L (ref 136–145)
Total Protein: 6.1 g/dL — ABNORMAL LOW (ref 6.4–8.3)

## 2014-04-04 MED ORDER — INFLUENZA VAC SPLIT QUAD 0.5 ML IM SUSY
0.5000 mL | PREFILLED_SYRINGE | Freq: Once | INTRAMUSCULAR | Status: AC
  Administered 2014-04-04: 0.5 mL via INTRAMUSCULAR
  Filled 2014-04-04: qty 0.5

## 2014-04-04 MED ORDER — HEPARIN SOD (PORK) LOCK FLUSH 100 UNIT/ML IV SOLN
500.0000 [IU] | Freq: Once | INTRAVENOUS | Status: AC
  Administered 2014-04-04: 500 [IU] via INTRAVENOUS
  Filled 2014-04-04: qty 5

## 2014-04-04 MED ORDER — SODIUM CHLORIDE 0.9 % IJ SOLN
10.0000 mL | INTRAMUSCULAR | Status: DC | PRN
  Administered 2014-04-04: 10 mL via INTRAVENOUS
  Filled 2014-04-04: qty 10

## 2014-04-04 NOTE — Patient Instructions (Signed)

## 2014-04-04 NOTE — Patient Instructions (Signed)
You are doing well.  Continue healthy diet, exercise, and breast exams.

## 2014-04-04 NOTE — Progress Notes (Addendum)
Patient Care Team: Provider Not In System as PCP - General  DIAGNOSIS: Breast cancer of upper-inner quadrant of right female breast   Primary site: Breast (Right)   Staging method: AJCC 7th Edition   Clinical: Stage IA (T1c, NX, cM0) signed by [name], MD on 08/10/2013  4:11 PM   Summary: Stage IA (T1c, NX, cM0)   Prognostic indicators: ER 0%; PR 0% Her2Neu negative, Ki-67 13%   SUMMARY OF ONCOLOGIC HISTORY:   Breast cancer of upper-inner quadrant of right female breast   08/03/2013 Initial Diagnosis Breast cancer of upper-inner quadrant of right female breast; invasive ductal carcinoma with ductal carcinoma in situ. The tumor was ER negative PR negative HER-2/neu negative with a proliferation marker Ki-67 13% ([place])   09/07/2013 Surgery Bilateral Mastectomy and SLN biopsy (Dr.Haywood) Left Breast: Benign; Right Breast IDC with DCIS Grade 3 3 SLN neg ER 0%; PR 0%; Her2: ratio 1.4 (Neg)   09/27/2013 - 01/24/2014 Chemotherapy FEC adjuvant chemo given on day 1 of a 21 day cycle with Neulasta on day 2 for granulocyte support.  A total of 6 cycles were given.     01/31/2014 - 01/31/2014 Chemotherapy Taxol Carboplatin x 1 cycle.  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    02/08/2014 - 02/15/2014 Chemotherapy Taxol alone given on 9/8 and 9/14, however discontinued due to increasing grade II peripheral neuropathy.   02/21/2014 - 02/21/2014 Chemotherapy Gemcitabine weekly given x 1, but discontinued due to neutropenia.      Cancer of right breast    CHIEF COMPLIANT: follow up of right breast cancer  INTERVAL HISTORY: Wendy [name] is a 68 year old Caucasian with above-mentioned history of triple negative right breast cancer treated with bilateral mastectomies. She is here for follow up to evaluate her long lasting side effects of chemotherapy.  She is doing better today.  She had dysguesia which is improving.  Her peripheral neuropathy has improved.  She has minimal numbness in her fingertips and  mild in her right thumb.  She continues to have it in her great toe of her left foot.  She would like her flu shot today.  Her weight is down four pounds, and she is eating her meals out of hunger and isn't force feeding herself anymore.  She is not at 100%, but she is exercising, playing tennis, and going to the gym.  She wants to know about prophylactic bisphosphanate use, and wants her port removed.  At the end of the appointment after requesting her port be removed, her daughter and husband would like to know what other chemotherapy she can receive to reduce her risk of recurrence since it was stopped early.    REVIEW OF SYSTEMS:   A 10 point review of systems was conducted and is otherwise negative except for what is noted above.    Past Medical History  Diagnosis Date  . Family history of anesthesia complication     "daughter PONV; long time in recovery" (09/07/2013)  . Asthma     as a child  . Asthmatic bronchitis     "about q yr" (09/07/2013)  . Pneumonia     "several times; usually follows asthmatic bronchitis" (09/07/2013)  . Sinus headache     "maybe 2-3 times/month"  . Breast cancer     "right breast"    Past Surgical History  Procedure Laterality Date  . Tubal ligation  1977  . Finger fracture surgery Left 2010    "middle finger spiral fx; little finger  broken; tissue damage"  . Tonsillectomy    . Portacath placement Right 09/07/2013  . Mastectomy complete / simple w/ sentinel node biopsy Right 09/07/2013  . Mastectomy complete / simple  09/07/2013  . Breast biopsy Right 07/2013  . Dilation and curettage of uterus      S/P miscarriage  . Wrist fracture surgery Left 2010  . Mastectomy w/ sentinel node biopsy Bilateral 09/07/2013    Procedure: BILATERAL MASTECTOMY WITH RIGHT SENTINEL LYMPH NODE BIOPSY;  Surgeon: [name], MD;  Location: [facility];  Service: General;  Laterality: Bilateral;  . Portacath placement Right 09/07/2013    Procedure: INSERTION PORT-A-CATH;  Surgeon: [name], MD;  Location: [facility];  Service: General;  Laterality: Right;   History   Social History  . Marital Status: Married    Spouse Name: N/A    Number of Children: 2  . Years of Education: N/A   Occupational History  . Not on file.   Social History Main Topics  . Smoking status: Never Smoker   . Smokeless tobacco: Never Used  . Alcohol Use: No  . Drug Use: No  . Sexual Activity: No   Other Topics Concern  . Not on file   Social History Narrative   Family History  Problem Relation Age of Onset  . Bladder Cancer Sister 60    worked in [employer], around 2nd hand smoke all her life  . Diabetes Sister   . Colon cancer Brother 55    non-smoker  . Diabetes Brother   . Colon cancer Maternal Uncle 58  . Osteoporosis Mother   . Parkinson's disease Mother   . Diabetes Mother   . Rheum arthritis Father   . Diabetes Father   . Breast cancer Paternal Aunt     dx over 56  . Diabetes Maternal Grandmother   . Diabetes Maternal Grandfather   . Uterine cancer Other 50  . Colon cancer Maternal Aunt     dx in her 40s-70s  . Breast cancer Cousin     paternal cousin dx in her 57s-70s    ALLERGIES:  has No Known Allergies.  MEDICATIONS:  Current Outpatient Prescriptions  Medication Sig Dispense Refill  . Calcium Carbonate-Vitamin D (CALCIUM PLUS VITAMIN D PO) Take 1 tablet by mouth 2 (two) times daily.    Wendy [name] Kitchen omeprazole (PRILOSEC) 40 MG capsule Take 1 capsule (40 mg total) by mouth daily. 30 capsule 5  . Alum & Mag Hydroxide-Simeth (MAGIC MOUTHWASH) SOLN SWISH AND spit ONE TEASPOONFUL BY MOUTH FOUR TIMES DAILY AS NEEDED. 240 mL 1  . b complex vitamins tablet Take 1 tablet by mouth daily.    Wendy [name] Kitchen dexamethasone (DECADRON) 4 MG tablet Take 2 tablets by mouth once a day on the day after chemotherapy and then take 2 tablets two times a day for 2 days. Take with food. 30 tablet 1  . diphenhydrAMINE (BENADRYL) 25 mg capsule Take 25 mg by mouth 3 (three) times daily.    . famotidine  (PEPCID) 20 MG tablet Take 20 mg by mouth 2 (two) times daily.    Wendy [name] Kitchen lidocaine (XYLOCAINE) 2 % solution Use as directed 5 mLs in the mouth or throat 4 (four) times daily as needed for mouth pain (use with current supply of MMW). 120 mL 1  . lidocaine-prilocaine (EMLA) cream Apply 1 application topically as needed. 30 g 1  . LORazepam (ATIVAN) 0.5 MG tablet TAKE ONE TABLET EVERY 6 HOURS AS NEEDED FOR NAUSEA AND VOMITING  30 tablet 0  . ondansetron (ZOFRAN) 8 MG tablet TAKE ONE TABLET TWICE DAILY AS NEEDED *START ON THE 3RD DAY AFTER CHEMOTHERAPY* 30 tablet 1  . prochlorperazine (COMPAZINE) 10 MG tablet TAKE ONE TABLET EVERY 6 HOURS AS NEEDED FOR NAUSEA AND VOMITING 30 tablet 1   No current facility-administered medications for this visit.    PHYSICAL EXAMINATION: ECOG PERFORMANCE STATUS: 1 - Symptomatic but completely ambulatory  Filed Vitals:   04/04/14 0919  BP: 128/55  Pulse: 66  Temp: 98.8 F (37.1 C)  Resp: 66   Filed Weights   04/04/14 0919  Weight: 146 lb 6.4 oz (66.407 kg)   GENERAL: Patient is a well appearing female in no acute distress HEENT:  Sclerae anicteric.  Oropharynx clear and moist. No ulcerations or evidence of oropharyngeal candidiasis. Neck is supple.  NODES:  No cervical, supraclavicular, or axillary lymphadenopathy palpated.  BREAST EXAM:  S/p bilateral mastectomies, no sign of recurrence. LUNGS:  Clear to auscultation bilaterally.  No wheezes or rhonchi. HEART:  Regular rate and rhythm. No murmur appreciated. ABDOMEN:  Soft, nontender.  Positive, normoactive bowel sounds. No organomegaly palpated. MSK:  No focal spinal tenderness to palpation. Full range of motion bilaterally in the upper extremities. EXTREMITIES:  No peripheral edema.   SKIN:  Clear with no obvious rashes or skin changes. No nail dyscrasia. NEURO:  Nonfocal. Well oriented.  Appropriate affect.    LABORATORY DATA:  I have reviewed the data as listed   Chemistry      Component Value  Date/Time   NA 142 03/07/2014 0913   NA 143 01/10/2014 1045   K 3.8 03/07/2014 0913   K 4.0 01/10/2014 1045   CL 105 01/10/2014 1045   CO2 27 03/07/2014 0913   CO2 27 01/10/2014 1045   BUN 12.7 03/07/2014 0913   BUN 13 01/10/2014 1045   CREATININE 0.8 03/07/2014 0913   CREATININE 0.82 01/10/2014 1045      Component Value Date/Time   CALCIUM 9.6 03/07/2014 0913   CALCIUM 9.4 01/10/2014 1045   ALKPHOS 73 03/07/2014 0913   ALKPHOS 68 01/10/2014 1045   AST 20 03/07/2014 0913   AST 31 01/10/2014 1045   ALT 22 03/07/2014 0913   ALT 35 01/10/2014 1045   BILITOT 0.33 03/07/2014 0913   BILITOT <0.2* 01/10/2014 1045       Lab Results  Component Value Date   WBC 3.0* 04/04/2014   HGB 11.0* 04/04/2014   HCT 33.5* 04/04/2014   MCV 98.8 04/04/2014   PLT 194 04/04/2014   NEUTROABS 1.9 04/04/2014     ASSESSMENT: Stage IA T1, N0, 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     PLAN:  Wendy [name] is doing well.  Her labs remain stable from a month ago.  I gave the patient and her husband and daughter a lot of reassurance that it will take time for them to fully recover.  I also requested port removal from Dr. Dalbert Batman.  I recommended continued healthy diet, exercise, and monthly chest wall exams.  She did meet and discuss with Dr. Lindi Adie her concerns over chemotherapy.     The patient has a good understanding of the overall plan. she agrees with it. She will call with any problems that may develop before her next visit here.  I spent 25 minutes counseling the patient face to face. The total time spent in the  appointment was 30 minutes and more than 50% was on counseling and review of test results   Minette Headland, [place] [phone] 04/04/2014 9:31 AM   Attending Note  I personally saw and examined Wendy [name]. The plan  of care was discussed with her. I agree with the assessment and plan as documented above. Wendy [name] continues to improve significantly in terms of neuropathy energy levels. I discussed with the family that in terms of quality of life concerns, the neuropathy was going to significantly devastated her quality of life and take away any enjoyment that she gains from playing tennis and staying active if he continued chemotherapy any further. Because of those issues, we elected to discontinue chemotherapy. Not only that, after one dose of gemcitabine, patient's blood counts were not able to continue with the treatment as well. Hence at this point we will initiate surveillance process with physical exams, mammograms and followups in the future. Patient's daughter was very concerned about stopping treatment fairly but she understood the reasoning and was happy that we stop the treatment because of her excellent physical condition and retained quality of life as a result of stopping therapy.  Signed Rulon Eisenmenger, MD

## 2014-04-05 ENCOUNTER — Telehealth: Payer: Self-pay | Admitting: *Deleted

## 2014-04-05 ENCOUNTER — Telehealth (INDEPENDENT_AMBULATORY_CARE_PROVIDER_SITE_OTHER): Payer: Self-pay

## 2014-04-05 NOTE — Telephone Encounter (Signed)
-----   Message from Fanny Skates, MD sent at 04/04/2014  7:07 PM EST ----- Please call patient and ask if she would like port removed under local in office or under sedation at [facility].  Oncologists have referred her back for port removal.  Thanks.  HMI

## 2014-04-05 NOTE — Telephone Encounter (Signed)
I have given pt a copy of these labs results when she was here in office. Message to be forwarded to [place].

## 2014-04-05 NOTE — Telephone Encounter (Signed)
-----   Message from Minette Headland, NP sent at 04/04/2014 10:40 AM EST ----- Please call patient with results  ----- Message -----    From: [facility]    Sent: 04/04/2014   9:11 AM      To: Minette Headland, NP

## 2014-04-05 NOTE — Telephone Encounter (Signed)
LMOM for pt to call back. See attached note below.

## 2014-04-06 ENCOUNTER — Other Ambulatory Visit (INDEPENDENT_AMBULATORY_CARE_PROVIDER_SITE_OTHER): Payer: Self-pay | Admitting: General Surgery

## 2014-04-11 ENCOUNTER — Ambulatory Visit: Payer: Commercial Managed Care - HMO

## 2014-04-11 ENCOUNTER — Other Ambulatory Visit: Payer: Commercial Managed Care - HMO

## 2014-04-11 ENCOUNTER — Ambulatory Visit: Payer: Commercial Managed Care - HMO | Admitting: Hematology and Oncology

## 2014-04-14 ENCOUNTER — Encounter (HOSPITAL_BASED_OUTPATIENT_CLINIC_OR_DEPARTMENT_OTHER): Payer: Self-pay | Admitting: *Deleted

## 2014-04-18 ENCOUNTER — Ambulatory Visit: Payer: Commercial Managed Care - HMO

## 2014-04-18 ENCOUNTER — Other Ambulatory Visit: Payer: Commercial Managed Care - HMO

## 2014-04-18 NOTE — H&P (Signed)
  History: This patient underwent bilateral total mastectomies and a right sentinel lymph node biopsy on 09/07/2013. Final pathology report shows left breast to be benign and the right breast to have a 1.7 cm invasive carcinoma, node negative, triple negative breast cancer, stage T1c, N0. Marland Kitchen The Port-A-Cath is working well. She has started her chemotherapy. She has full range of motion of her shoulders and doesn't have any complaint about her arms or her shoulders. She denies arm swelling or numbness.  Exam: Patient looks well. Palpation noted. Heart :  RRR, no ectopy Lungs:  Clear to A &P. Neck without adenopathy or mass bilateral mastectomy skin flaps looked very good. Healthy skin. No fluid or hematoma. No nodules or ulceration. No axillary mass. Range of motion her shoulders is 180 abduction. No swelling  Assessment: Triple negative breast cancer right breast, stage TI C., N0 Uneventful recovery following right total mastectomy with sentinel node biopsy, prophylactic left mastectomy  Plan: Continue chemotherapy Remove port when chemo completed.  Planned for 04/19/2014 Periodic staging studies under the guidance of the [facility] Return to see me for physical exam in December of this year.    Edsel Petrin. Dalbert Batman, M.D., [facility] General and Minimally invasive Surgery Breast and Colorectal Surgery Office: [phone]

## 2014-04-19 ENCOUNTER — Encounter (HOSPITAL_BASED_OUTPATIENT_CLINIC_OR_DEPARTMENT_OTHER): Payer: Self-pay | Admitting: *Deleted

## 2014-04-19 ENCOUNTER — Ambulatory Visit (HOSPITAL_BASED_OUTPATIENT_CLINIC_OR_DEPARTMENT_OTHER)
Admission: RE | Admit: 2014-04-19 | Discharge: 2014-04-19 | Disposition: A | Discharge status: Home / Self Care | Payer: Medicare HMO | Source: Ambulatory Visit | Attending: General Surgery | Admitting: General Surgery

## 2014-04-19 ENCOUNTER — Encounter (HOSPITAL_BASED_OUTPATIENT_CLINIC_OR_DEPARTMENT_OTHER)
Admission: RE | Disposition: A | Discharge status: Home / Self Care | Payer: Self-pay | Source: Ambulatory Visit | Attending: General Surgery

## 2014-04-19 ENCOUNTER — Ambulatory Visit (HOSPITAL_BASED_OUTPATIENT_CLINIC_OR_DEPARTMENT_OTHER): Payer: Medicare HMO | Admitting: Anesthesiology

## 2014-04-19 DIAGNOSIS — C50211 Malignant neoplasm of upper-inner quadrant of right female breast: Secondary | ICD-10-CM | POA: Diagnosis present

## 2014-04-19 DIAGNOSIS — Z452 Encounter for adjustment and management of vascular access device: Secondary | ICD-10-CM | POA: Diagnosis not present

## 2014-04-19 DIAGNOSIS — Z853 Personal history of malignant neoplasm of breast: Secondary | ICD-10-CM | POA: Insufficient documentation

## 2014-04-19 DIAGNOSIS — T451X5A Adverse effect of antineoplastic and immunosuppressive drugs, initial encounter: Secondary | ICD-10-CM | POA: Insufficient documentation

## 2014-04-19 DIAGNOSIS — D701 Agranulocytosis secondary to cancer chemotherapy: Secondary | ICD-10-CM | POA: Diagnosis not present

## 2014-04-19 DIAGNOSIS — G620 Drug-induced polyneuropathy: Secondary | ICD-10-CM | POA: Insufficient documentation

## 2014-04-19 DIAGNOSIS — Z9013 Acquired absence of bilateral breasts and nipples: Secondary | ICD-10-CM | POA: Diagnosis not present

## 2014-04-19 HISTORY — PX: PORT-A-CATH REMOVAL: SHX5289

## 2014-04-19 LAB — POCT HEMOGLOBIN-HEMACUE: Hemoglobin: 12.3 g/dL (ref 12.0–15.0)

## 2014-04-19 SURGERY — REMOVAL PORT-A-CATH
Anesthesia: Monitor Anesthesia Care | Site: Chest | Laterality: Right

## 2014-04-19 MED ORDER — PROPOFOL INFUSION 10 MG/ML OPTIME
INTRAVENOUS | Status: DC | PRN
  Administered 2014-04-19: 75 ug/kg/min via INTRAVENOUS

## 2014-04-19 MED ORDER — PROMETHAZINE HCL 25 MG/ML IJ SOLN: 6.2500 mg | INTRAMUSCULAR | Status: DC | PRN

## 2014-04-19 MED ORDER — OXYCODONE HCL 5 MG/5ML PO SOLN: 5.0000 mg | Freq: Once | ORAL | Status: DC | PRN

## 2014-04-19 MED ORDER — CEFAZOLIN SODIUM-DEXTROSE 2-3 GM-% IV SOLR
INTRAVENOUS | Status: AC
  Filled 2014-04-19: qty 50

## 2014-04-19 MED ORDER — CHLORHEXIDINE GLUCONATE 4 % EX LIQD
1.0000 | Freq: Once | CUTANEOUS | Status: DC
Start: 2014-04-20 — End: 2014-04-19

## 2014-04-19 MED ORDER — FENTANYL CITRATE 0.05 MG/ML IJ SOLN
INTRAMUSCULAR | Status: DC | PRN
  Administered 2014-04-19: 50 ug via INTRAVENOUS

## 2014-04-19 MED ORDER — FENTANYL CITRATE 0.05 MG/ML IJ SOLN: 50.0000 ug | INTRAMUSCULAR | Status: DC | PRN

## 2014-04-19 MED ORDER — PROPOFOL 10 MG/ML IV EMUL
INTRAVENOUS | Status: AC
  Filled 2014-04-19: qty 50

## 2014-04-19 MED ORDER — SODIUM BICARBONATE 4 % IV SOLN
INTRAVENOUS | Status: AC
  Filled 2014-04-19: qty 5

## 2014-04-19 MED ORDER — FENTANYL CITRATE 0.05 MG/ML IJ SOLN
INTRAMUSCULAR | Status: AC
  Filled 2014-04-19: qty 4

## 2014-04-19 MED ORDER — LACTATED RINGERS IV SOLN
INTRAVENOUS | Status: DC
  Administered 2014-04-19: 08:00:00 via INTRAVENOUS

## 2014-04-19 MED ORDER — FENTANYL CITRATE 0.05 MG/ML IJ SOLN: 25.0000 ug | INTRAMUSCULAR | Status: DC | PRN

## 2014-04-19 MED ORDER — MIDAZOLAM HCL 2 MG/2ML IJ SOLN: 0.5000 mg | Freq: Once | INTRAMUSCULAR | Status: DC | PRN

## 2014-04-19 MED ORDER — LIDOCAINE-EPINEPHRINE (PF) 1 %-1:200000 IJ SOLN
INTRAMUSCULAR | Status: AC
  Filled 2014-04-19: qty 10

## 2014-04-19 MED ORDER — LIDOCAINE HCL (CARDIAC) 20 MG/ML IV SOLN
INTRAVENOUS | Status: DC | PRN
  Administered 2014-04-19: 50 mg via INTRAVENOUS

## 2014-04-19 MED ORDER — HYDROCODONE-ACETAMINOPHEN 5-325 MG PO TABS: 1.0000 | ORAL_TABLET | Freq: Four times a day (QID) | ORAL | Status: DC | PRN

## 2014-04-19 MED ORDER — OXYCODONE HCL 5 MG PO TABS: 5.0000 mg | ORAL_TABLET | Freq: Once | ORAL | Status: DC | PRN

## 2014-04-19 MED ORDER — MEPERIDINE HCL 25 MG/ML IJ SOLN: 6.2500 mg | INTRAMUSCULAR | Status: DC | PRN

## 2014-04-19 MED ORDER — CEFAZOLIN SODIUM-DEXTROSE 2-3 GM-% IV SOLR
2.0000 g | INTRAVENOUS | Status: AC
  Administered 2014-04-19: 2 g via INTRAVENOUS

## 2014-04-19 MED ORDER — MIDAZOLAM HCL 2 MG/2ML IJ SOLN: 1.0000 mg | INTRAMUSCULAR | Status: DC | PRN

## 2014-04-19 MED ORDER — SODIUM BICARBONATE 4 % IV SOLN
INTRAVENOUS | Status: DC | PRN
  Administered 2014-04-19: 9 mL

## 2014-04-19 SURGICAL SUPPLY — 39 items
BENZOIN TINCTURE PRP APPL 2/3 (GAUZE/BANDAGES/DRESSINGS) IMPLANT
BLADE HEX COATED 2.75 (ELECTRODE) ×3 IMPLANT
BLADE SURG 15 STRL LF DISP TIS (BLADE) ×2 IMPLANT
BLADE SURG 15 STRL SS (BLADE) ×4
CANISTER SUCT 1200ML W/VALVE (MISCELLANEOUS) IMPLANT
CHLORAPREP W/TINT 26ML (MISCELLANEOUS) ×3 IMPLANT
CLOSURE WOUND 1/2 X4 (GAUZE/BANDAGES/DRESSINGS)
COVER BACK TABLE 60X90IN (DRAPES) ×3 IMPLANT
COVER MAYO STAND STRL (DRAPES) ×3 IMPLANT
DECANTER SPIKE VIAL GLASS SM (MISCELLANEOUS) IMPLANT
DRAPE PED LAPAROTOMY (DRAPES) ×3 IMPLANT
DRAPE UTILITY XL STRL (DRAPES) ×3 IMPLANT
DRSG TEGADERM 4X4.75 (GAUZE/BANDAGES/DRESSINGS) IMPLANT
ELECT REM PT RETURN 9FT ADLT (ELECTROSURGICAL) ×3
ELECTRODE REM PT RTRN 9FT ADLT (ELECTROSURGICAL) ×1 IMPLANT
GLOVE BIO SURGEON STRL SZ 6.5 (GLOVE) ×2 IMPLANT
GLOVE BIO SURGEONS STRL SZ 6.5 (GLOVE) ×1
GLOVE BIOGEL PI IND STRL 7.0 (GLOVE) ×1 IMPLANT
GLOVE BIOGEL PI INDICATOR 7.0 (GLOVE) ×2
GLOVE EUDERMIC 7 POWDERFREE (GLOVE) ×3 IMPLANT
GOWN STRL REUS W/ TWL LRG LVL3 (GOWN DISPOSABLE) ×1 IMPLANT
GOWN STRL REUS W/ TWL XL LVL3 (GOWN DISPOSABLE) ×1 IMPLANT
GOWN STRL REUS W/TWL LRG LVL3 (GOWN DISPOSABLE) ×2
GOWN STRL REUS W/TWL XL LVL3 (GOWN DISPOSABLE) ×2
LIQUID BAND (GAUZE/BANDAGES/DRESSINGS) ×3 IMPLANT
NEEDLE HYPO 25X1 1.5 SAFETY (NEEDLE) ×3 IMPLANT
PACK BASIN DAY SURGERY FS (CUSTOM PROCEDURE TRAY) ×3 IMPLANT
PENCIL BUTTON HOLSTER BLD 10FT (ELECTRODE) ×3 IMPLANT
SLEEVE SCD COMPRESS KNEE MED (MISCELLANEOUS) IMPLANT
SPONGE GAUZE 4X4 12PLY STER LF (GAUZE/BANDAGES/DRESSINGS) IMPLANT
STRIP CLOSURE SKIN 1/2X4 (GAUZE/BANDAGES/DRESSINGS) IMPLANT
SUT MNCRL AB 4-0 PS2 18 (SUTURE) ×3 IMPLANT
SUT VICRYL 3-0 CR8 SH (SUTURE) ×3 IMPLANT
SYRINGE 10CC LL (SYRINGE) ×3 IMPLANT
TOWEL OR 17X24 6PK STRL BLUE (TOWEL DISPOSABLE) ×6 IMPLANT
TOWEL OR NON WOVEN STRL DISP B (DISPOSABLE) ×3 IMPLANT
TUBE CONNECTING 20'X1/4 (TUBING)
TUBE CONNECTING 20X1/4 (TUBING) IMPLANT
YANKAUER SUCT BULB TIP NO VENT (SUCTIONS) IMPLANT

## 2014-04-19 NOTE — Op Note (Signed)
Patient Name:           Wendy [name]   Date of Surgery:        04/19/2014  Pre op Diagnosis:      Breast cancer  Post op Diagnosis:    Breast cancer  Procedure:                 Removal of Port-A-Cath  Surgeon:                     Edsel Petrin. Dalbert Batman, M.D., FACS  Assistant:                      none  Operative Indications:   This is a 68 year old female who underwent bilateral total mastectomies and right sentinel node biopsy on 09/07/2013. 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 She completed 60-70% of her chemotherapy. She was referred back for Port-A-Cath removal. I discussed the indications, details, techniques and numerous risk of the surgery with her and her family. She understands all these issues. All of her questions are answered. She agrees with this plan. She requested sedation.  Operative Findings:       The port and catheter were removed intact. No signs of infection or bleeding.  Procedure in Detail:          The patient was brought to the operating room and was monitored and sedated by the anesthesia department. The right upper chest was prepped and draped in a sterile fashion. Surgical timeout was performed. 1% Xylocaine with epinephrine was used as a local infiltration anesthetic. Transverse incision was made in the right infraclavicular area, through the old incision. Dissection was carried down to the port which was dissected away from its capsule. All 3 Prolene sutures were divided and removed. The port and catheter were removed intact. The subcutaneous tissue was closed with 3-0 Vicryl sutures and skin closed in a running subcuticular 4-0 Monocryl and Dermabond. Patient tolerated procedure well and was taken to PACU in stable condition. EBL 5 mL. Counts correct. Complications none.     Edsel Petrin. Dalbert Batman, M.D., FACS General and Minimally Invasive Surgery Breast and Colorectal Surgery  04/19/2014 7:58 AM

## 2014-04-19 NOTE — Interval H&P Note (Signed)
History and Physical Interval Note:  04/19/2014 8:01 AM  Wendy [name]  has presented today for surgery, with the diagnosis of history of cancer  The various methods of treatment have been discussed with the patient and family. After consideration of risks, benefits and other options for treatment, the patient has consented to  Procedure(s): REMOVAL PORT-A-CATH (Right) as a surgical intervention .  The patient's history has been reviewed, patient examined, no change in status, stable for surgery.  I have reviewed the patient's chart and labs.  Questions were answered to the patient's satisfaction.     [name]

## 2014-04-19 NOTE — Anesthesia Postprocedure Evaluation (Signed)
  Anesthesia Post-op Note  Patient: Wendy [name]  Procedure(s) Performed: Procedure(s): REMOVAL PORT-A-CATH (Right)  Patient Location: PACU  Anesthesia Type:MAC  Level of Consciousness: awake, alert , oriented and patient cooperative  Airway and Oxygen Therapy: Patient Spontanous Breathing  Post-op Pain: none  Post-op Assessment: Post-op Vital signs reviewed, Patient's Cardiovascular Status Stable, Respiratory Function Stable, Patent Airway, No signs of Nausea or vomiting and Pain level controlled  Post-op Vital Signs: Reviewed and stable  Last Vitals:  Filed Vitals:   04/19/14 0853  BP: 106/53  Pulse: 68  Temp: 36.7 C  Resp: 16    Complications: No apparent anesthesia complications

## 2014-04-19 NOTE — Discharge Instructions (Signed)
icepack intermittently for 24 hours  The Dermabond skin glue will wear off in about 3 weeks.  You may shower, starting tomorrow. No tub baths for 2 weeks  Avoid sports for 2 weeks  You may drive a car in 2-3 days when you are comfortable.   Call Dr. Dalbert Batman if there are any problems.   Post Anesthesia Home Care Instructions  Activity: Get plenty of rest for the remainder of the day. A responsible adult should stay with you for 24 hours following the procedure.  For the next 24 hours, DO NOT: -Drive a car -Paediatric nurse -Drink alcoholic beverages -Take any medication unless instructed by your physician -Make any legal decisions or sign important papers.  Meals: Start with liquid foods such as gelatin or soup. Progress to regular foods as tolerated. Avoid greasy, spicy, heavy foods. If nausea and/or vomiting occur, drink only clear liquids until the nausea and/or vomiting subsides. Call your physician if vomiting continues.  Special Instructions/Symptoms: Your throat may feel dry or sore from the anesthesia or the breathing tube placed in your throat during surgery. If this causes discomfort, gargle with warm salt water. The discomfort should disappear within 24 hours.

## 2014-04-19 NOTE — Transfer of Care (Signed)
Immediate Anesthesia Transfer of Care Note  Patient: Wendy [name]  Procedure(s) Performed: Procedure(s): REMOVAL PORT-A-CATH (Right)  Patient Location: PACU  Anesthesia Type:MAC  Level of Consciousness: awake, alert  and patient cooperative  Airway & Oxygen Therapy: Patient Spontanous Breathing  Post-op Assessment: Report given to PACU RN and Post -op Vital signs reviewed and stable  Post vital signs: Reviewed and stable  Complications: No apparent anesthesia complications

## 2014-04-19 NOTE — Anesthesia Preprocedure Evaluation (Addendum)
Anesthesia Evaluation  Patient identified by MRN, date of birth, ID band Patient awake    Reviewed: Allergy & Precautions, H&P , NPO status , Patient's Chart, lab work & pertinent test results  History of Anesthesia Complications Negative for: history of anesthetic complications  Airway Mallampati: II  TM Distance: >3 FB Neck ROM: Full    Dental  (+) Teeth Intact, Dental Advisory Given   Pulmonary neg pulmonary ROS,  breath sounds clear to auscultation        Cardiovascular Rhythm:Regular Rate:Normal  4/15 ECHO: EF 55-60%, valves OK   Neuro/Psych Peripheral neuropathy with chemo    GI/Hepatic Neg liver ROS, GERD-  Medicated and Controlled,  Endo/Other  negative endocrine ROS  Renal/GU negative Renal ROS     Musculoskeletal   Abdominal   Peds  Hematology negative hematology ROS (+)   Anesthesia Other Findings Breast cancer: surgery and chemo  Reproductive/Obstetrics                           Anesthesia Physical Anesthesia Plan  ASA: II  Anesthesia Plan: MAC   Post-op Pain Management:    Induction: Intravenous  Airway Management Planned: Simple Face Mask and Natural Airway  Additional Equipment:   Intra-op Plan:   Post-operative Plan:   Informed Consent: I have reviewed the patients History and Physical, chart, labs and discussed the procedure including the risks, benefits and alternatives for the proposed anesthesia with the patient or authorized representative who has indicated his/her understanding and acceptance.   Dental advisory given  Plan Discussed with: CRNA and Surgeon  Anesthesia Plan Comments: (Plan routine monitors, MAC)        Anesthesia Quick Evaluation

## 2014-04-20 ENCOUNTER — Encounter (HOSPITAL_BASED_OUTPATIENT_CLINIC_OR_DEPARTMENT_OTHER): Payer: Self-pay | Admitting: General Surgery

## 2014-05-09 ENCOUNTER — Encounter: Payer: Self-pay | Admitting: Nurse Practitioner

## 2014-05-11 ENCOUNTER — Other Ambulatory Visit: Payer: Self-pay | Admitting: *Deleted

## 2014-06-13 ENCOUNTER — Other Ambulatory Visit (HOSPITAL_BASED_OUTPATIENT_CLINIC_OR_DEPARTMENT_OTHER): Payer: PPO

## 2014-06-13 ENCOUNTER — Telehealth: Payer: Self-pay | Admitting: Hematology and Oncology

## 2014-06-13 ENCOUNTER — Ambulatory Visit (HOSPITAL_BASED_OUTPATIENT_CLINIC_OR_DEPARTMENT_OTHER): Payer: PPO | Admitting: Hematology and Oncology

## 2014-06-13 ENCOUNTER — Ambulatory Visit: Payer: Commercial Managed Care - HMO | Admitting: Adult Health

## 2014-06-13 VITALS — BP 116/64 | HR 71 | Temp 98.3°F | Resp 18 | Ht 66.0 in | Wt 143.3 lb

## 2014-06-13 DIAGNOSIS — C50211 Malignant neoplasm of upper-inner quadrant of right female breast: Secondary | ICD-10-CM

## 2014-06-13 DIAGNOSIS — C50911 Malignant neoplasm of unspecified site of right female breast: Secondary | ICD-10-CM

## 2014-06-13 DIAGNOSIS — Z171 Estrogen receptor negative status [ER-]: Secondary | ICD-10-CM

## 2014-06-13 LAB — CBC WITH DIFFERENTIAL/PLATELET
BASO%: 0.3 % (ref 0.0–2.0)
Basophils Absolute: 0 10*3/uL (ref 0.0–0.1)
EOS%: 2 % (ref 0.0–7.0)
Eosinophils Absolute: 0.1 10*3/uL (ref 0.0–0.5)
HEMATOCRIT: 38.7 % (ref 34.8–46.6)
HEMOGLOBIN: 12.9 g/dL (ref 11.6–15.9)
LYMPH%: 28.7 % (ref 14.0–49.7)
MCH: 31.2 pg (ref 25.1–34.0)
MCHC: 33.3 g/dL (ref 31.5–36.0)
MCV: 93.5 fL (ref 79.5–101.0)
MONO#: 0.3 10*3/uL (ref 0.1–0.9)
MONO%: 7.7 % (ref 0.0–14.0)
NEUT#: 2.1 10*3/uL (ref 1.5–6.5)
NEUT%: 61.3 % (ref 38.4–76.8)
PLATELETS: 154 10*3/uL (ref 145–400)
RBC: 4.14 10*6/uL (ref 3.70–5.45)
RDW: 12.5 % (ref 11.2–14.5)
WBC: 3.5 10*3/uL — ABNORMAL LOW (ref 3.9–10.3)
lymph#: 1 10*3/uL (ref 0.9–3.3)

## 2014-06-13 LAB — COMPREHENSIVE METABOLIC PANEL (CC13)
ALK PHOS: 97 U/L (ref 40–150)
ALT: 12 U/L (ref 0–55)
ANION GAP: 7 meq/L (ref 3–11)
AST: 18 U/L (ref 5–34)
Albumin: 3.8 g/dL (ref 3.5–5.0)
BILIRUBIN TOTAL: 0.38 mg/dL (ref 0.20–1.20)
BUN: 14 mg/dL (ref 7.0–26.0)
CO2: 29 meq/L (ref 22–29)
Calcium: 9.2 mg/dL (ref 8.4–10.4)
Chloride: 105 mEq/L (ref 98–109)
Creatinine: 0.8 mg/dL (ref 0.6–1.1)
EGFR: 74 mL/min/{1.73_m2} — AB (ref >90)
Glucose: 93 mg/dl (ref 70–140)
Potassium: 4.1 mEq/L (ref 3.5–5.1)
Sodium: 141 mEq/L (ref 136–145)
Total Protein: 6.7 g/dL (ref 6.4–8.3)

## 2014-06-13 NOTE — Assessment & Plan Note (Signed)
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  Breast cancer surveillance: Recommended continuing annual breast exams and surveillance mammograms. Toxicities to chemotherapy: Neuropathy grade 1: It is not affecting her at all and she is able to function normally. Patient's energy and also back to her normal self. Apparently she can now defeat even men in tennis. She does a lot of exercising and  Workouts. I did not recommend routine imaging studies like CT scans for further evaluation. This is based on NCCN guidelines. Return to follow-up in 6 months

## 2014-06-13 NOTE — Telephone Encounter (Signed)
per pof to sch pt appt-gavwe pt copy of sch

## 2014-06-13 NOTE — Progress Notes (Signed)
Patient Care Team: Myer Peer, MD as PCP - General (Family Medicine)  DIAGNOSIS: Breast cancer of upper-inner quadrant of right female breast   Staging form: Breast, AJCC 7th Edition     Clinical: Stage IA (T1c, NX, cM0) - Signed by [name], MD on 08/10/2013       Prognostic indicators: ER 0%; PR 0% Her2Neu negative, Ki-67 13%      Pathologic: No stage assigned - Unsigned       Prognostic indicators: ER 0%; PR 0% Her2Neu negative, Ki-67 13%    SUMMARY OF ONCOLOGIC HISTORY:   Breast cancer of upper-inner quadrant of right female breast   08/03/2013 Initial Diagnosis Breast cancer of upper-inner quadrant of right female breast; invasive ductal carcinoma with ductal carcinoma in situ. 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  A total of 6 cycles were given.     01/31/2014 - 01/31/2014 Chemotherapy Taxol Carboplatin x 1 cycle.  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    02/08/2014 - 02/15/2014 Chemotherapy Taxol alone given on 9/8 and 9/14, however discontinued due to increasing grade II peripheral neuropathy.   02/21/2014 - 02/21/2014 Chemotherapy Gemcitabine weekly given x 1, but discontinued due to neutropenia.      Cancer of right breast    CHIEF COMPLIANT: Follow-up of right breast cancer after completion of chemotherapy  INTERVAL HISTORY: Wendy [name] is a severe related with above-mentioned history of triple negative right breast cancer treated with bilateral mastectomies followed by adjuvant chemotherapy. She had done FEC X 6 follow-up with Taxol and carboplatin followed by Taxol, followed by gemcitabine. She  could not tolerate any of these regimens. Hence chemotherapy was discontinued. She has been doing extremely well after finishing up with the chemotherapy to stay active and play tennis.  REVIEW OF SYSTEMS:   Constitutional: Denies fevers, chills or abnormal weight loss Eyes: Denies blurriness of vision Ears, nose, mouth, throat, and face: Denies mucositis or sore throat Respiratory: Denies cough, dyspnea or wheezes Cardiovascular: Denies palpitation, chest discomfort or lower extremity swelling Gastrointestinal:  Denies nausea, heartburn or change in bowel habits Skin: Denies abnormal skin rashes Lymphatics: Denies new lymphadenopathy or easy bruising Neurological:Denies numbness, tingling or new weaknesses Behavioral/Psych: Mood is stable, no new changes  Breast:  denies any pain or lumps or nodules in either breasts All other systems were reviewed with the patient and are negative.  I have reviewed the past medical history, past surgical history, social history and family history with the patient and they are unchanged from previous note.  ALLERGIES:  has No Known Allergies.  MEDICATIONS:  Current Outpatient Prescriptions  Medication Sig Dispense Refill  . Calcium Carbonate-Vitamin D (CALCIUM PLUS VITAMIN D PO) Take 1 tablet by mouth 2 (two) times daily.    . famotidine (PEPCID) 20 MG tablet Take 20 mg by mouth 2 (two) times daily.    Marland Kitchen omeprazole (PRILOSEC) 40 MG capsule Take 1 capsule (40 mg total) by mouth daily. 30 capsule 5  . SYMBICORT 160-4.5 MCG/ACT inhaler Inhale 2 puffs into the lungs 2 (two) times daily.  6   No  current facility-administered medications for this visit.    PHYSICAL EXAMINATION: ECOG PERFORMANCE STATUS: 0 - Asymptomatic  Filed Vitals:   06/13/14 1035  BP: 116/64  Pulse: 71  Temp: 98.3 F (36.8 C)  Resp: 18   Filed Weights   06/13/14 1035  Weight: 143 lb 4.8 oz (65 kg)    GENERAL:alert, no distress and comfortable SKIN: skin color, texture,  turgor are normal, no rashes or significant lesions EYES: normal, Conjunctiva are pink and non-injected, sclera clear OROPHARYNX:no exudate, no erythema and lips, buccal mucosa, and tongue normal  NECK: supple, thyroid normal size, non-tender, without nodularity LYMPH:  no palpable lymphadenopathy in the cervical, axillary or inguinal LUNGS: clear to auscultation and percussion with normal breathing effort HEART: regular rate & rhythm and no murmurs and no lower extremity edema ABDOMEN:abdomen soft, non-tender and normal bowel sounds Musculoskeletal:no cyanosis of digits and no clubbing  NEURO: alert & oriented x 3 with fluent speech, no focal motor/sensory deficits  LABORATORY DATA:  I have reviewed the data as listed   Chemistry      Component Value Date/Time   NA 141 06/13/2014 1028   NA 143 01/10/2014 1045   K 4.1 06/13/2014 1028   K 4.0 01/10/2014 1045   CL 105 01/10/2014 1045   CO2 29 06/13/2014 1028   CO2 27 01/10/2014 1045   BUN 14.0 06/13/2014 1028   BUN 13 01/10/2014 1045   CREATININE 0.8 06/13/2014 1028   CREATININE 0.82 01/10/2014 1045      Component Value Date/Time   CALCIUM 9.2 06/13/2014 1028   CALCIUM 9.4 01/10/2014 1045   ALKPHOS 97 06/13/2014 1028   ALKPHOS 68 01/10/2014 1045   AST 18 06/13/2014 1028   AST 31 01/10/2014 1045   ALT 12 06/13/2014 1028   ALT 35 01/10/2014 1045   BILITOT 0.38 06/13/2014 1028   BILITOT <0.2* 01/10/2014 1045       Lab Results  Component Value Date   WBC 3.5* 06/13/2014   HGB 12.9 06/13/2014   HCT 38.7 06/13/2014   MCV 93.5 06/13/2014   PLT 154 06/13/2014   NEUTROABS 2.1 06/13/2014   ASSESSMENT & PLAN:  Breast cancer of upper-inner quadrant of right female breast Stage IA T1, N0, 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  Breast cancer surveillance:  Recommended continuing annual breast exams and surveillance mammograms. Toxicities to chemotherapy: Neuropathy grade 1: It is not affecting her at all and she is able to function normally. Patient's energy and also back to her normal self. Apparently she can now defeat even men in tennis. She does a lot of exercising and  Workouts. I did not recommend routine imaging studies like CT scans for further evaluation. This is based on NCCN guidelines. Return to follow-up in 6 months   Orders Placed This Encounter  Procedures  . CBC with Differential    Standing Status: Future     Number of Occurrences:      Standing Expiration Date: 06/13/2015  . Comprehensive metabolic panel (Cmet) - [facility]    Standing Status: Future     Number of Occurrences:      Standing Expiration Date: 06/13/2015   The patient has a good understanding of the overall plan. she agrees with it. She will call with any problems that may develop before her next visit here.   Rulon Eisenmenger, MD 06/13/2014  4:07 PM

## 2014-11-18 ENCOUNTER — Telehealth: Payer: Self-pay | Admitting: Hematology and Oncology

## 2014-11-18 NOTE — Telephone Encounter (Signed)
Spoke with patient and she request to reschedule her appointment due to being out of town

## 2014-11-22 ENCOUNTER — Other Ambulatory Visit: Payer: PPO

## 2014-11-22 ENCOUNTER — Ambulatory Visit: Payer: PPO | Admitting: Hematology and Oncology

## 2014-12-07 ENCOUNTER — Encounter: Payer: Self-pay | Admitting: Hematology and Oncology

## 2014-12-07 ENCOUNTER — Telehealth: Payer: Self-pay | Admitting: Hematology and Oncology

## 2014-12-07 ENCOUNTER — Other Ambulatory Visit (HOSPITAL_BASED_OUTPATIENT_CLINIC_OR_DEPARTMENT_OTHER): Payer: PPO

## 2014-12-07 ENCOUNTER — Ambulatory Visit (HOSPITAL_BASED_OUTPATIENT_CLINIC_OR_DEPARTMENT_OTHER): Payer: PPO | Admitting: Hematology and Oncology

## 2014-12-07 VITALS — BP 108/52 | HR 76 | Temp 98.4°F | Resp 18 | Ht 66.0 in | Wt 145.4 lb

## 2014-12-07 DIAGNOSIS — Z171 Estrogen receptor negative status [ER-]: Secondary | ICD-10-CM | POA: Diagnosis not present

## 2014-12-07 DIAGNOSIS — C50211 Malignant neoplasm of upper-inner quadrant of right female breast: Secondary | ICD-10-CM

## 2014-12-07 DIAGNOSIS — C50911 Malignant neoplasm of unspecified site of right female breast: Secondary | ICD-10-CM

## 2014-12-07 LAB — CBC WITH DIFFERENTIAL/PLATELET
BASO%: 0.5 % (ref 0.0–2.0)
BASOS ABS: 0 10*3/uL (ref 0.0–0.1)
EOS%: 1.5 % (ref 0.0–7.0)
Eosinophils Absolute: 0.1 10*3/uL (ref 0.0–0.5)
HCT: 39 % (ref 34.8–46.6)
HGB: 13.2 g/dL (ref 11.6–15.9)
LYMPH%: 24.9 % (ref 14.0–49.7)
MCH: 31.8 pg (ref 25.1–34.0)
MCHC: 33.9 g/dL (ref 31.5–36.0)
MCV: 93.8 fL (ref 79.5–101.0)
MONO#: 0.3 10*3/uL (ref 0.1–0.9)
MONO%: 6.8 % (ref 0.0–14.0)
NEUT#: 3.1 10*3/uL (ref 1.5–6.5)
NEUT%: 66.3 % (ref 38.4–76.8)
Platelets: 180 10*3/uL (ref 145–400)
RBC: 4.16 10*6/uL (ref 3.70–5.45)
RDW: 13 % (ref 11.2–14.5)
WBC: 4.7 10*3/uL (ref 3.9–10.3)
lymph#: 1.2 10*3/uL (ref 0.9–3.3)

## 2014-12-07 LAB — COMPREHENSIVE METABOLIC PANEL (CC13)
ALBUMIN: 4 g/dL (ref 3.5–5.0)
ALT: 14 U/L (ref 0–55)
AST: 19 U/L (ref 5–34)
Alkaline Phosphatase: 86 U/L (ref 40–150)
Anion Gap: 9 mEq/L (ref 3–11)
BUN: 21.1 mg/dL (ref 7.0–26.0)
CALCIUM: 9.9 mg/dL (ref 8.4–10.4)
CHLORIDE: 104 meq/L (ref 98–109)
CO2: 28 meq/L (ref 22–29)
Creatinine: 0.8 mg/dL (ref 0.6–1.1)
EGFR: 71 mL/min/{1.73_m2} — ABNORMAL LOW (ref >90)
GLUCOSE: 92 mg/dL (ref 70–140)
Potassium: 4.6 mEq/L (ref 3.5–5.1)
Sodium: 141 mEq/L (ref 136–145)
TOTAL PROTEIN: 6.7 g/dL (ref 6.4–8.3)
Total Bilirubin: 0.35 mg/dL (ref 0.20–1.20)

## 2014-12-07 NOTE — Progress Notes (Signed)
Patient Care Team: Myer Peer, MD as PCP - General (Family Medicine)  DIAGNOSIS: Breast cancer of upper-inner quadrant of right female breast   Staging form: Breast, AJCC 7th Edition     Clinical: Stage IA (T1c, NX, cM0) - Signed by [name], MD on 08/10/2013       Prognostic indicators: ER 0%; PR 0% Her2Neu negative, Ki-67 13%      Pathologic: No stage assigned - Unsigned       Prognostic indicators: ER 0%; PR 0% Her2Neu negative, Ki-67 13%    SUMMARY OF ONCOLOGIC HISTORY:   Breast cancer of upper-inner quadrant of right female breast   08/03/2013 Initial Diagnosis Breast cancer of upper-inner quadrant of right female breast; invasive ductal carcinoma with ductal carcinoma in situ. 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  A total of 6 cycles were given.     01/31/2014 - 01/31/2014 Chemotherapy Taxol Carboplatin x 1 cycle.  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    02/08/2014 - 02/15/2014 Chemotherapy Taxol alone given on 9/8 and 9/14, however discontinued due to increasing grade II peripheral neuropathy.   02/21/2014 - 02/21/2014 Chemotherapy Gemcitabine weekly given x 1, but discontinued due to neutropenia.      Cancer of right breast    CHIEF COMPLIANT: Follow-up of breast cancer  INTERVAL HISTORY: QUANEISHA HANISCH is a 69 year old with above-mentioned history of triple negative breast cancer one underwent bilateral mastectomies followed by adjuvant chemotherapy. She is here for routine follow-up she reports that she is doing great denies any problems or concerns denies any lumps or nodules seen exercises by  playing tennis several times in the day. Her husband reports that her life has been normal in fact her tennis has improved even more than before. Her taste is fine to come back. Mild neuropathy on the great toes in both sides and persistent.  REVIEW OF SYSTEMS:   Constitutional: Denies fevers, chills or abnormal weight loss Eyes: Denies blurriness of vision Ears, nose, mouth, throat, and face: Denies mucositis or sore throat Respiratory: Denies cough, dyspnea or wheezes Cardiovascular: Denies palpitation, chest discomfort or lower extremity swelling Gastrointestinal:  Denies nausea, heartburn or change in bowel habits Skin: Denies abnormal skin rashes Lymphatics: Denies new lymphadenopathy or easy bruising Neurological:Denies numbness, tingling or new weaknesses Behavioral/Psych: Mood is stable, no new changes  Breast:  denies any pain or lumps or nodules in either breasts All other systems were reviewed with the patient and are negative.  I have reviewed the past medical history, past surgical history, social history and family history with the patient and they are unchanged from previous note.  ALLERGIES:  has No Known Allergies.  MEDICATIONS:  Current Outpatient Prescriptions  Medication Sig Dispense Refill  . Calcium Carbonate-Vitamin D (CALCIUM PLUS VITAMIN D PO) Take 1 tablet by mouth 2 (two) times daily.     No current facility-administered medications for this visit.    PHYSICAL EXAMINATION: ECOG PERFORMANCE STATUS: 0 - Asymptomatic  Filed Vitals:   12/07/14 1357  BP: 108/52  Pulse: 76  Temp: 98.4 F (36.9 C)  Resp: 18  Filed Weights   12/07/14 1357  Weight: 145 lb 6.4 oz (65.953 kg)    GENERAL:alert, no distress and comfortable SKIN: skin color, texture, turgor are normal, no rashes or significant lesions EYES: normal, Conjunctiva are pink and non-injected, sclera clear OROPHARYNX:no exudate, no erythema and lips, buccal mucosa, and tongue normal  NECK:  supple, thyroid normal size, non-tender, without nodularity LYMPH:  no palpable lymphadenopathy in the cervical, axillary or inguinal LUNGS: clear to auscultation and percussion with normal breathing effort HEART: regular rate & rhythm and no murmurs and no lower extremity edema ABDOMEN:abdomen soft, non-tender and normal bowel sounds Musculoskeletal:no cyanosis of digits and no clubbing  NEURO: alert & oriented x 3 with fluent speech, no focal motor/sensory deficits BREAST: No palpable lumps or nodules in the chest wall or axilla. (exam performed in the presence of a chaperone)  LABORATORY DATA:  I have reviewed the data as listed   Chemistry      Component Value Date/Time   NA 141 06/13/2014 1028   NA 143 01/10/2014 1045   K 4.1 06/13/2014 1028   K 4.0 01/10/2014 1045   CL 105 01/10/2014 1045   CO2 29 06/13/2014 1028   CO2 27 01/10/2014 1045   BUN 14.0 06/13/2014 1028   BUN 13 01/10/2014 1045   CREATININE 0.8 06/13/2014 1028   CREATININE 0.82 01/10/2014 1045      Component Value Date/Time   CALCIUM 9.2 06/13/2014 1028   CALCIUM 9.4 01/10/2014 1045   ALKPHOS 97 06/13/2014 1028   ALKPHOS 68 01/10/2014 1045   AST 18 06/13/2014 1028   AST 31 01/10/2014 1045   ALT 12 06/13/2014 1028   ALT 35 01/10/2014 1045   BILITOT 0.38 06/13/2014 1028   BILITOT <0.2* 01/10/2014 1045       Lab Results  Component Value Date   WBC 4.7 12/07/2014   HGB 13.2 12/07/2014   HCT 39.0 12/07/2014   MCV 93.8 12/07/2014   PLT 180 12/07/2014   NEUTROABS 3.1 12/07/2014    ASSESSMENT & PLAN:  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 Breast exam 12/07/2014: Normal 2. No  role of mammograms since she had bilateral mastectomies  Survivorship: Patient placed on his and stays very active and busy. I encouraged her to continue to be physically active and eat more fruits and vegetables and less red meat. She is apparently the best pickleball player in her age group. She takes no medications except calcium. Her husband bikes 40 miles daily.  Return to clinic in 6 months for continued breast surveillance   No orders of the defined types were placed in this encounter.   The patient has a good understanding of the overall plan. she agrees with it. she will call with any problems that may develop before the next visit here.   Rulon Eisenmenger, MD

## 2014-12-07 NOTE — Assessment & Plan Note (Signed)
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 Breast exam 12/07/2014: Normal 2. Mammogram no normal mammograms since she had bilateral mastectomies  Survivorship: Patient placed on his and stays very active and busy. I encouraged her to continue to be physically active and eat more fruits and vegetables and less red meat.  Return to clinic in 6 months for continued breast surveillance

## 2014-12-07 NOTE — Telephone Encounter (Signed)
Appointments made and avs printed for patient °

## 2014-12-20 ENCOUNTER — Telehealth: Payer: Self-pay

## 2014-12-20 NOTE — Telephone Encounter (Signed)
Returning pt call. Pt has been experiencing and uncomfortable feeling in her lower left rib cage. She has been leaning over to the right when sitting to kind of get the pressure off. She does not remember any injury.  She was feeling this for a week or 2 before her visit with Dr Lindi Adie on 7/6 but did not know to mention it. She just heard that breast cancer likes to come back in the ribs. No fever, no redness to area, no pain with deep breath.  Also feeling pain in left hip when she first gets up and the first few steps. This has been for a few days to a week.  Pt has not taken any pain meds "it is not that bad, it is kind of an awareness thing. She is asking what Dr Lindi Adie would recommend.

## 2014-12-20 NOTE — Telephone Encounter (Signed)
Spoke with Dr. Lindi Adie and patient advised to watch for a month and call us if it does not go away. Also advised patient that if symptoms worsen to call us. Patient verbalized understanding.

## 2015-04-06 ENCOUNTER — Telehealth: Payer: Self-pay

## 2015-04-06 NOTE — Telephone Encounter (Signed)
Pt called stating she has an appt w/Dr Lindi Adie in 06/08/15. She just had a physical and her WBC were 2.8. She is asking if Dr Lindi Adie wants to see her sooner than 06/08/15?

## 2015-04-06 NOTE — Telephone Encounter (Signed)
Requested patient have her PCP send [place] copy of her labs.  Provided pt fax no.  Pt voiced understanding.

## 2015-04-10 ENCOUNTER — Telehealth: Payer: Self-pay | Admitting: *Deleted

## 2015-04-10 ENCOUNTER — Other Ambulatory Visit: Payer: Self-pay | Admitting: *Deleted

## 2015-04-10 ENCOUNTER — Telehealth: Payer: Self-pay | Admitting: Hematology and Oncology

## 2015-04-10 DIAGNOSIS — C50211 Malignant neoplasm of upper-inner quadrant of right female breast: Secondary | ICD-10-CM

## 2015-04-10 NOTE — Telephone Encounter (Signed)
Spoke with patient and she is aware of her appointments °

## 2015-04-10 NOTE — Telephone Encounter (Signed)
Labs received from PCP, reviewed by Dr. Lindi Adie. pof sent to have CBC and flow cytometry drawn in 1 month with f/u 1 week after. Advised patient of this and she verbalized understanding.

## 2015-05-01 ENCOUNTER — Telehealth: Payer: Self-pay | Admitting: Hematology and Oncology

## 2015-05-01 ENCOUNTER — Encounter: Payer: Self-pay | Admitting: Hematology and Oncology

## 2015-05-01 ENCOUNTER — Other Ambulatory Visit (HOSPITAL_BASED_OUTPATIENT_CLINIC_OR_DEPARTMENT_OTHER): Payer: PPO

## 2015-05-01 ENCOUNTER — Ambulatory Visit (HOSPITAL_BASED_OUTPATIENT_CLINIC_OR_DEPARTMENT_OTHER): Payer: PPO | Admitting: Hematology and Oncology

## 2015-05-01 VITALS — BP 118/64 | HR 75 | Temp 97.7°F | Resp 18 | Ht 66.0 in | Wt 148.5 lb

## 2015-05-01 DIAGNOSIS — Z9221 Personal history of antineoplastic chemotherapy: Secondary | ICD-10-CM

## 2015-05-01 DIAGNOSIS — Z171 Estrogen receptor negative status [ER-]: Secondary | ICD-10-CM

## 2015-05-01 DIAGNOSIS — C50211 Malignant neoplasm of upper-inner quadrant of right female breast: Secondary | ICD-10-CM

## 2015-05-01 DIAGNOSIS — Z9013 Acquired absence of bilateral breasts and nipples: Secondary | ICD-10-CM | POA: Diagnosis not present

## 2015-05-01 DIAGNOSIS — Z853 Personal history of malignant neoplasm of breast: Secondary | ICD-10-CM

## 2015-05-01 LAB — CBC WITH DIFFERENTIAL/PLATELET
BASO%: 0.3 % (ref 0.0–2.0)
Basophils Absolute: 0 10*3/uL (ref 0.0–0.1)
EOS%: 1.9 % (ref 0.0–7.0)
Eosinophils Absolute: 0.1 10*3/uL (ref 0.0–0.5)
HCT: 39.7 % (ref 34.8–46.6)
HEMOGLOBIN: 13.2 g/dL (ref 11.6–15.9)
LYMPH%: 28.6 % (ref 14.0–49.7)
MCH: 32 pg (ref 25.1–34.0)
MCHC: 33.2 g/dL (ref 31.5–36.0)
MCV: 96.2 fL (ref 79.5–101.0)
MONO#: 0.2 10*3/uL (ref 0.1–0.9)
MONO%: 5.9 % (ref 0.0–14.0)
NEUT%: 63.3 % (ref 38.4–76.8)
NEUTROS ABS: 2.2 10*3/uL (ref 1.5–6.5)
PLATELETS: 182 10*3/uL (ref 145–400)
RBC: 4.12 10*6/uL (ref 3.70–5.45)
RDW: 13.8 % (ref 11.2–14.5)
WBC: 3.5 10*3/uL — AB (ref 3.9–10.3)
lymph#: 1 10*3/uL (ref 0.9–3.3)

## 2015-05-01 NOTE — Telephone Encounter (Signed)
GAVE AND PRINTED APPT SCHED ADNA VS FO RPT FOR mAY 2017

## 2015-05-01 NOTE — Progress Notes (Signed)
Patient Care Team: Wendy Peer, MD as PCP - General (Family Medicine)  DIAGNOSIS: Breast cancer of upper-inner quadrant of right female breast [facility])   Staging form: Breast, AJCC 7th Edition     Clinical: Stage IA (T1c, NX, cM0) - Signed by [name], MD on 08/10/2013       Prognostic indicators: ER 0%; PR 0% Her2Neu negative, Ki-67 13%      Pathologic: No stage assigned - Unsigned       Prognostic indicators: ER 0%; PR 0% Her2Neu negative, Ki-67 13%    SUMMARY OF ONCOLOGIC HISTORY:   Breast cancer of upper-inner quadrant of right female breast ([place])   08/03/2013 Initial Diagnosis Breast cancer of upper-inner quadrant of right female breast; invasive ductal carcinoma with ductal carcinoma in situ. The tumor was ER negative PR negative HER-2/neu negative with a proliferation marker Ki-67 13% ([place])   09/07/2013 Surgery Bilateral Mastectomy and SLN biopsy (Dr.Haywood) Left Breast: Benign; Right Breast IDC with DCIS Grade 3 3 SLN neg ER 0%; PR 0%; Her2: ratio 1.4 (Neg)   09/27/2013 - 01/24/2014 Chemotherapy FEC adjuvant chemo given on day 1 of a 21 day cycle with Neulasta on day 2 for granulocyte support.  A total of 6 cycles were given.     01/31/2014 - 01/31/2014 Chemotherapy Taxol Carboplatin x 1 cycle.  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    02/08/2014 - 02/15/2014 Chemotherapy Taxol alone given on 9/8 and 9/14, however discontinued due to increasing grade II peripheral neuropathy.   02/21/2014 - 02/21/2014 Chemotherapy Gemcitabine weekly given x 1, but discontinued due to neutropenia.      Cancer of right breast ([place])    CHIEF COMPLIANT: follow-up of blood counts  INTERVAL HISTORY: Wendy [name] is a 69 year old with above-mentioned history of right breast cancer treated with bilateral mastectomies followed by adjuvant chemotherapy followed by surveillance. She appears to be doing quite well. She is still playing tennis and stays very active accompanied by her husband. Her  husband also bicycles almost 40-60 miles daily. She is accompanied today by her daughters. She denies any lumps or nodules in the breast chest wall or axilla. She was recently found to have a slightly decreased absolute neutrophil count and was concerned about her white blood cells.  REVIEW OF SYSTEMS:   Constitutional: Denies fevers, chills or abnormal weight loss Eyes: Denies blurriness of vision Ears, nose, mouth, throat, and face: Denies mucositis or sore throat Respiratory: Denies cough, dyspnea or wheezes Cardiovascular: Denies palpitation, chest discomfort or lower extremity swelling Gastrointestinal:  Denies nausea, heartburn or change in bowel habits Skin: Denies abnormal skin rashes Lymphatics: Denies new lymphadenopathy or easy bruising Neurological:Denies numbness, tingling or new weaknesses Behavioral/Psych: Mood is stable, no new changes  Breast: denies any lumps or nodules in chest wall All other systems were reviewed with the patient and are negative.  I have reviewed the past medical history, past surgical history, social history and family history with the patient and they are unchanged from previous note.  ALLERGIES:  has No Known Allergies.  MEDICATIONS:  Current Outpatient Prescriptions  Medication Sig Dispense Refill  . Calcium Carbonate-Vitamin D (CALCIUM PLUS VITAMIN D PO) Take 1 tablet by mouth 2 (two) times daily.     No current facility-administered medications for this visit.    PHYSICAL EXAMINATION: ECOG PERFORMANCE STATUS: 1 - Symptomatic but completely ambulatory  Filed Vitals:   05/01/15 1137  BP: 118/64  Pulse: 75  Temp: 97.7 F (36.5  C)  Resp: 18   Filed Weights   05/01/15 1137  Weight: 148 lb 8 oz (67.359 kg)    GENERAL:alert, no distress and comfortable SKIN: skin color, texture, turgor are normal, no rashes or significant lesions EYES: normal, Conjunctiva are pink and non-injected, sclera clear OROPHARYNX:no exudate, no erythema and  lips, buccal mucosa, and tongue normal  NECK: supple, thyroid normal size, non-tender, without nodularity LYMPH:  no palpable lymphadenopathy in the cervical, axillary or inguinal LUNGS: clear to auscultation and percussion with normal breathing effort HEART: regular rate & rhythm and no murmurs and no lower extremity edema ABDOMEN:abdomen soft, non-tender and normal bowel sounds Musculoskeletal:no cyanosis of digits and no clubbing  NEURO: alert & oriented x 3 with fluent speech, no focal motor/sensory deficits BREAST:no palpable lumps or nodules in the chest wall or axilla. (exam performed in the presence of a chaperone)  LABORATORY DATA:  I have reviewed the data as listed   Chemistry      Component Value Date/Time   NA 141 12/07/2014 1345   NA 143 01/10/2014 1045   K 4.6 12/07/2014 1345   K 4.0 01/10/2014 1045   CL 105 01/10/2014 1045   CO2 28 12/07/2014 1345   CO2 27 01/10/2014 1045   BUN 21.1 12/07/2014 1345   BUN 13 01/10/2014 1045   CREATININE 0.8 12/07/2014 1345   CREATININE 0.82 01/10/2014 1045      Component Value Date/Time   CALCIUM 9.9 12/07/2014 1345   CALCIUM 9.4 01/10/2014 1045   ALKPHOS 86 12/07/2014 1345   ALKPHOS 68 01/10/2014 1045   AST 19 12/07/2014 1345   AST 31 01/10/2014 1045   ALT 14 12/07/2014 1345   ALT 35 01/10/2014 1045   BILITOT 0.35 12/07/2014 1345   BILITOT <0.2* 01/10/2014 1045       Lab Results  Component Value Date   WBC 3.5* 05/01/2015   HGB 13.2 05/01/2015   HCT 39.7 05/01/2015   MCV 96.2 05/01/2015   PLT 182 05/01/2015   NEUTROABS 2.2 05/01/2015    ASSESSMENT & PLAN:  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 Breast exam 05/01/2015: Normal 2. No role of mammograms since she had bilateral mastectomies  Survivorship: Patient placed on his and stays very active and busy. I encouraged her to continue to be physically active and eat more fruits and vegetables and less red meat. She is apparently the best pickleball player in her age group. She takes no medications except calcium. Her husband bikes 40 miles daily.  Return to clinic in 6 months for continued breast surveillance   No orders of the defined types were placed in this encounter.   The patient has a good understanding of the overall plan. she agrees with it. she will call with any problems that may develop before the next visit here.   Rulon Eisenmenger, MD 05/01/2015

## 2015-05-01 NOTE — Addendum Note (Signed)
Addended by: Prentiss Bells on: 05/01/2015 06:19 PM   Modules accepted: Medications

## 2015-05-01 NOTE — Assessment & Plan Note (Signed)
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 Breast exam 05/01/2015: Normal 2. No role of mammograms since she had bilateral mastectomies  Survivorship: Patient placed on his and stays very active and busy. I encouraged her to continue to be physically active and eat more fruits and vegetables and less red meat. She is apparently the best pickleball player in her age group. She takes no medications except calcium. Her husband bikes 40 miles daily.  Return to clinic in 6 months for continued breast surveillance 

## 2015-05-08 ENCOUNTER — Other Ambulatory Visit: Payer: PPO

## 2015-05-15 ENCOUNTER — Ambulatory Visit: Payer: PPO | Admitting: Hematology and Oncology

## 2015-06-08 ENCOUNTER — Ambulatory Visit: Payer: PPO | Admitting: Hematology and Oncology

## 2015-08-02 DIAGNOSIS — J069 Acute upper respiratory infection, unspecified: Secondary | ICD-10-CM | POA: Diagnosis not present

## 2015-08-08 DIAGNOSIS — J40 Bronchitis, not specified as acute or chronic: Secondary | ICD-10-CM | POA: Diagnosis not present

## 2015-08-21 DIAGNOSIS — R062 Wheezing: Secondary | ICD-10-CM | POA: Diagnosis not present

## 2015-08-21 DIAGNOSIS — R05 Cough: Secondary | ICD-10-CM | POA: Diagnosis not present

## 2015-09-07 ENCOUNTER — Institutional Professional Consult (permissible substitution): Payer: PPO | Admitting: Internal Medicine

## 2015-10-06 ENCOUNTER — Ambulatory Visit (INDEPENDENT_AMBULATORY_CARE_PROVIDER_SITE_OTHER): Payer: PPO | Admitting: Internal Medicine

## 2015-10-06 ENCOUNTER — Encounter: Payer: Self-pay | Admitting: Internal Medicine

## 2015-10-06 VITALS — BP 110/60 | HR 83 | Ht 66.5 in | Wt 155.4 lb

## 2015-10-06 DIAGNOSIS — J42 Unspecified chronic bronchitis: Secondary | ICD-10-CM | POA: Diagnosis not present

## 2015-10-06 LAB — NITRIC OXIDE: NITRIC OXIDE: 24

## 2015-10-06 NOTE — Progress Notes (Signed)
Subjective:    Patient ID: Wendy [name], female    DOB: 1945/06/24, 70 y.o.   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  10/06/2015 Consult for Bronchitis: Patient is here for a consult for bronchitis that started 08/02/2015 that took 6 weeks to resolve.She was treated by [facility] in [place] with 3 prednisone tapers and  3 rounds of antibiotics as well as an  Injection. She was given both Spiriva ,Symbicort and albuterol inhalers in addition to albuterol nebs.She was diagnosed with whooping cough near the end of her illness. CXR done in March was normal.She does have bouts with bronchitis almost every year, but  It has never taken this long to resolve.She is better now, but is here to establish herself as a patient with the practice for the next time this happens. Spirometry done 08/31/2015 was normal.She has no chest pain, fever, orthopnea, hemoptysis   Current outpatient prescriptions:  .  Calcium Carbonate-Vitamin D (CALCIUM PLUS VITAMIN D PO), Take 1 tablet by mouth 2 (two) times daily., Disp: , Rfl:    Past Medical History  Diagnosis Date  . Family history of anesthesia complication     "daughter PONV; long time in recovery" (09/07/2013)  . Asthma     as a child  . Asthmatic bronchitis     "about q yr" (09/07/2013)  . Pneumonia     "several times; usually follows asthmatic bronchitis" (09/07/2013)  . Sinus headache     "maybe 2-3 times/month"  . Breast cancer ([place])     "right breast"     No Known Allergies  Review of Systems  Constitutional: Negative for fever and unexpected weight change.  HENT: Negative for congestion, dental problem, ear pain, nosebleeds, postnasal drip, rhinorrhea, sinus pressure, sneezing, sore throat and trouble  swallowing.   Eyes: Negative for redness and itching.  Respiratory: Negative for cough, chest tightness, shortness of breath and wheezing.   Cardiovascular: Negative for palpitations and leg swelling.  Gastrointestinal: Negative for nausea and vomiting.  Genitourinary: Negative for dysuria.  Musculoskeletal: Negative for joint swelling.  Skin: Negative for rash.  Neurological: Negative for headaches.  Hematological: Does not bruise/bleed easily.  Psychiatric/Behavioral: Negative for dysphoric mood. The patient is not nervous/anxious.        Objective:   Physical Exam BP 110/60 mmHg  Pulse 83  Ht 5' 6.5" (1.689 m)  Wt 155 lb 6.4 oz (70.489 kg)  BMI 24.71 kg/m2  SpO2 99%   Physical Exam:  General- No distress,  A&Ox3, very pleasant ENT: No sinus tenderness, TM clear, pale nasal mucosa, no oral exudate,no post nasal drip, no LAN Cardiac: S1, S2, regular rate and rhythm, no murmur Chest: No wheeze/ rales/ dullness; no accessory muscle use, no nasal flaring, no sternal retractions Abd.: Soft Non-tender Ext: No clubbing cyanosis, edema Neuro:  normal strength Skin: No rashes, warm and dry Psych: normal mood and behavior  Magdalen Spatz, AGACNP-BC [facility] 10/06/2015    Assessment & Plan:  STAFF NOTE: I, Dr Ann Lions have personally reviewed patient's available data, including medical history, events of note, physical examination and test results as part of my evaluation. I have discussed with resident/NP and  other care providers such as pharmacist, RN and RRT.  In addition,  I personally evaluated patient and elicited key findings of   S: 70 year old lady who had a history of childhood asthma but then outgrew it. In 2015 showed stage I breast cancer and status post chemotherapy. She currently follows with the oncology clinic. According to her no serial imaging is required. She had a CT chest March 2015 which is clear according to report and my  personal visualization. Approximately 8-10 weeks ago started having a respiratory infection that then resulted in spinal cough. This is followed by another respiratory viral infection and the cough did not resolve infected got worse. She was treated with multiple courses of antibiotics and prednisone for the cough. Cough is associated with wheezing. Was present day and night. It was severe. Finally cough resolved 3 weeks ago. Currently she is asymptomatic. However she says that in the winter she tends to get postviral cough and she wants to establish with pulmonary.  Outside records reviewed and she did have spirometry during the cough episodes which was normal. She did have a chest x-ray of which I could not see the image but the report is normal with hyperinflation.  O: Looks well Clear to auscultation bilaterally  Exhaled nitric oxide today is 24 ppb and normal  A: Postviral reactive cough - -resolved. Currently exhaled nitric oxide is normal and so that she does not have any occult asthma  P: We can follow expectantly but she does want to follow-up with me so we'll see her in November around which time she tends to expect these flareups. She is having another cough flareup we will repeat an exhaled nitric oxide to make sure there is no active asthma at that time. If cough continues to be a problem and consider CT chest   .  Rest per NP/medical resident whose note is outlined above and that I agree with  Dr. Brand Males, M.D., [facility].C.P Pulmonary and Critical Care Medicine Staff Physician [facility] Pager: [phone], If no answer or between  15:00h - 7:00h: call [phone]  10/06/2015 4:37 PM

## 2015-10-06 NOTE — Patient Instructions (Addendum)
It is nice to meet you today. I am glad you have recovered from your bronchitis. Keep your inhalers and neb treatments on hand in the event you need them. FeNo today to check for airway inflammation.Your level was 20, which is normal. Follow up with Dr. Chase Caller in November of 2017. Please contact office for sooner follow up if symptoms do not improve or worsen or seek emergency care

## 2015-10-19 DIAGNOSIS — H25813 Combined forms of age-related cataract, bilateral: Secondary | ICD-10-CM | POA: Diagnosis not present

## 2015-10-23 ENCOUNTER — Other Ambulatory Visit (HOSPITAL_BASED_OUTPATIENT_CLINIC_OR_DEPARTMENT_OTHER): Payer: PPO

## 2015-10-23 ENCOUNTER — Encounter: Payer: Self-pay | Admitting: Hematology and Oncology

## 2015-10-23 ENCOUNTER — Telehealth: Payer: Self-pay | Admitting: Hematology and Oncology

## 2015-10-23 ENCOUNTER — Ambulatory Visit (HOSPITAL_BASED_OUTPATIENT_CLINIC_OR_DEPARTMENT_OTHER): Payer: PPO | Admitting: Hematology and Oncology

## 2015-10-23 VITALS — BP 107/59 | HR 65 | Temp 98.4°F | Resp 18 | Wt 152.2 lb

## 2015-10-23 DIAGNOSIS — Z853 Personal history of malignant neoplasm of breast: Secondary | ICD-10-CM

## 2015-10-23 DIAGNOSIS — C50211 Malignant neoplasm of upper-inner quadrant of right female breast: Secondary | ICD-10-CM

## 2015-10-23 DIAGNOSIS — D709 Neutropenia, unspecified: Secondary | ICD-10-CM | POA: Diagnosis not present

## 2015-10-23 DIAGNOSIS — D72819 Decreased white blood cell count, unspecified: Secondary | ICD-10-CM | POA: Diagnosis not present

## 2015-10-23 LAB — COMPREHENSIVE METABOLIC PANEL
ALT: 12 U/L (ref 0–55)
AST: 17 U/L (ref 5–34)
Albumin: 3.8 g/dL (ref 3.5–5.0)
Alkaline Phosphatase: 56 U/L (ref 40–150)
Anion Gap: 6 mEq/L (ref 3–11)
BILIRUBIN TOTAL: 0.36 mg/dL (ref 0.20–1.20)
BUN: 13.6 mg/dL (ref 7.0–26.0)
CO2: 26 meq/L (ref 22–29)
Calcium: 9 mg/dL (ref 8.4–10.4)
Chloride: 108 mEq/L (ref 98–109)
Creatinine: 0.8 mg/dL (ref 0.6–1.1)
EGFR: 78 mL/min/{1.73_m2} — AB (ref >90)
GLUCOSE: 111 mg/dL (ref 70–140)
Potassium: 4.3 mEq/L (ref 3.5–5.1)
SODIUM: 140 meq/L (ref 136–145)
TOTAL PROTEIN: 6.7 g/dL (ref 6.4–8.3)

## 2015-10-23 LAB — CBC WITH DIFFERENTIAL/PLATELET
BASO%: 0.4 % (ref 0.0–2.0)
BASOS ABS: 0 10*3/uL (ref 0.0–0.1)
EOS%: 1.7 % (ref 0.0–7.0)
Eosinophils Absolute: 0.1 10*3/uL (ref 0.0–0.5)
HCT: 38.2 % (ref 34.8–46.6)
HGB: 12.8 g/dL (ref 11.6–15.9)
LYMPH%: 36.5 % (ref 14.0–49.7)
MCH: 32.8 pg (ref 25.1–34.0)
MCHC: 33.5 g/dL (ref 31.5–36.0)
MCV: 98.1 fL (ref 79.5–101.0)
MONO#: 0.2 10*3/uL (ref 0.1–0.9)
MONO%: 5.3 % (ref 0.0–14.0)
NEUT#: 1.6 10*3/uL (ref 1.5–6.5)
NEUT%: 56.1 % (ref 38.4–76.8)
PLATELETS: 172 10*3/uL (ref 145–400)
RBC: 3.9 10*6/uL (ref 3.70–5.45)
RDW: 13.6 % (ref 11.2–14.5)
WBC: 2.9 10*3/uL — ABNORMAL LOW (ref 3.9–10.3)
lymph#: 1.1 10*3/uL (ref 0.9–3.3)

## 2015-10-23 NOTE — Progress Notes (Signed)
Patient Care Team: Myer Peer, MD as PCP - General (Family Medicine)  DIAGNOSIS: Breast cancer of upper-inner quadrant of right female breast [facility])   Staging form: Breast, AJCC 7th Edition     Clinical: Stage IA (T1c, NX, cM0) - Signed by [name], MD on 08/10/2013       Prognostic indicators: ER 0%; PR 0% Her2Neu negative, Ki-67 13%      Pathologic: No stage assigned - Unsigned       Prognostic indicators: ER 0%; PR 0% Her2Neu negative, Ki-67 13%  SUMMARY OF ONCOLOGIC HISTORY:   Breast cancer of upper-inner quadrant of right female breast ([place])   08/03/2013 Initial Diagnosis Breast cancer of upper-inner quadrant of right female breast; invasive ductal carcinoma with ductal carcinoma in situ. The tumor was ER negative PR negative HER-2/neu negative with a proliferation marker Ki-67 13% ([place])   09/07/2013 Surgery Bilateral Mastectomy and SLN biopsy (Dr.Haywood) Left Breast: Benign; Right Breast IDC with DCIS Grade 3 3 SLN neg ER 0%; PR 0%; Her2: ratio 1.4 (Neg)   09/27/2013 - 01/24/2014 Chemotherapy FEC adjuvant chemo given on day 1 of a 21 day cycle with Neulasta on day 2 for granulocyte support.  A total of 6 cycles were given.     01/31/2014 - 01/31/2014 Chemotherapy Taxol Carboplatin x 1 cycle.  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    02/08/2014 - 02/15/2014 Chemotherapy Taxol alone given on 9/8 and 9/14, however discontinued due to increasing grade II peripheral neuropathy.   02/21/2014 - 02/21/2014 Chemotherapy Gemcitabine weekly given x 1, but discontinued due to neutropenia.      Cancer of right breast ([place])    CHIEF COMPLIANT: Follow-up of breast cancer  INTERVAL HISTORY: Wendy [name] is a 70 year old with above-mentioned history of bilateral mastectomies for right breast cancer followed by adjuvant chemotherapy currently on surveillance. She reports no major problems or concerns. Energy levels are back to her normal. She is playing tennis regularly. She is  accompanied by her husband and daughter.  REVIEW OF SYSTEMS:   Constitutional: Denies fevers, chills or abnormal weight loss Eyes: Denies blurriness of vision Ears, nose, mouth, throat, and face: Denies mucositis or sore throat Respiratory: Denies cough, dyspnea or wheezes Cardiovascular: Denies palpitation, chest discomfort Gastrointestinal:  Denies nausea, heartburn or change in bowel habits Skin: Denies abnormal skin rashes Lymphatics: Denies new lymphadenopathy or easy bruising Neurological:Denies numbness, tingling or new weaknesses Behavioral/Psych: Mood is stable, no new changes  Extremities: No lower extremity edema Breast:  denies any pain or lumps or nodules in either chest wall and axilla All other systems were reviewed with the patient and are negative.  I have reviewed the past medical history, past surgical history, social history and family history with the patient and they are unchanged from previous note.  ALLERGIES:  has No Known Allergies.  MEDICATIONS:  Current Outpatient Prescriptions  Medication Sig Dispense Refill  . Calcium Carbonate-Vitamin D (CALCIUM PLUS VITAMIN D PO) Take 1 tablet by mouth 2 (two) times daily.     No current facility-administered medications for this visit.    PHYSICAL EXAMINATION: ECOG PERFORMANCE STATUS: 0 - Asymptomatic  Filed Vitals:   10/23/15 1056  BP: 107/59  Pulse: 65  Temp: 98.4 F (36.9 C)  Resp: 18   Filed Weights   10/23/15 1056  Weight: 152 lb 3.2 oz (69.037 kg)    GENERAL:alert, no distress and comfortable SKIN: skin color, texture, turgor are normal, no rashes or significant lesions  EYES: normal, Conjunctiva are pink and non-injected, sclera clear OROPHARYNX:no exudate, no erythema and lips, buccal mucosa, and tongue normal  NECK: supple, thyroid normal size, non-tender, without nodularity LYMPH:  no palpable lymphadenopathy in the cervical, axillary or inguinal LUNGS: clear to auscultation and percussion  with normal breathing effort HEART: regular rate & rhythm and no murmurs and no lower extremity edema ABDOMEN:abdomen soft, non-tender and normal bowel sounds MUSCULOSKELETAL:no cyanosis of digits and no clubbing  NEURO: alert & oriented x 3 with fluent speech, no focal motor/sensory deficits EXTREMITIES: No lower extremity edema BREAST: No palpable masses or nodules in either right or left Chest wall or axilla. No palpable axillary supraclavicular or infraclavicular adenopathy  (exam performed in the presence of a chaperone)  LABORATORY DATA:  I have reviewed the data as listed   Chemistry      Component Value Date/Time   NA 140 10/23/2015 1040   NA 143 01/10/2014 1045   K 4.3 10/23/2015 1040   K 4.0 01/10/2014 1045   CL 105 01/10/2014 1045   CO2 26 10/23/2015 1040   CO2 27 01/10/2014 1045   BUN 13.6 10/23/2015 1040   BUN 13 01/10/2014 1045   CREATININE 0.8 10/23/2015 1040   CREATININE 0.82 01/10/2014 1045      Component Value Date/Time   CALCIUM 9.0 10/23/2015 1040   CALCIUM 9.4 01/10/2014 1045   ALKPHOS 56 10/23/2015 1040   ALKPHOS 68 01/10/2014 1045   AST 17 10/23/2015 1040   AST 31 01/10/2014 1045   ALT 12 10/23/2015 1040   ALT 35 01/10/2014 1045   BILITOT 0.36 10/23/2015 1040   BILITOT <0.2* 01/10/2014 1045       Lab Results  Component Value Date   WBC 2.9* 10/23/2015   HGB 12.8 10/23/2015   HCT 38.2 10/23/2015   MCV 98.1 10/23/2015   PLT 172 10/23/2015   NEUTROABS 1.6 10/23/2015     ASSESSMENT & PLAN:  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 Breast exam 10/23/2015: Palpable concerns 2. No role of mammograms since she had  bilateral mastectomies  Leukopenia primarily neutropenia: Today her white count is 2.9 and ANC of 1.6. She does not have any infections. I discussed that this could be related to prior chemotherapy. As long as she is not running infectious risks this can be watched and monitored.  Survivorship: Patient stays very active and busy. I encouraged her to continue to be physically active and eat more fruits and vegetables and less red meat. She is apparently the best pickleball player in her age group. She takes no medications except calcium. Her husband bikes 40 miles daily.  Return to clinic in 1 year for survivorship clinic with CBC with differential to monitor the white blood cell count.  No orders of the defined types were placed in this encounter.   The patient has a good understanding of the overall plan. she agrees with it. she will call with any problems that may develop before the next visit here.   Rulon Eisenmenger, MD 10/23/2015

## 2015-10-23 NOTE — Telephone Encounter (Signed)
appt made and avs printed °

## 2015-10-23 NOTE — Assessment & Plan Note (Signed)
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 Breast exam 10/23/2015: Palpable concerns 2. No role of mammograms since she had bilateral mastectomies  Survivorship: Patient stays very active and busy. I encouraged her to continue to be physically active and eat more fruits and vegetables and less red meat. She is apparently the best pickleball player in her age group. She takes no medications except calcium. Her husband bikes 40 miles daily.  Return to clinic in 1 year for survivorship clinic

## 2015-11-10 DIAGNOSIS — B029 Zoster without complications: Secondary | ICD-10-CM | POA: Diagnosis not present

## 2015-11-29 DIAGNOSIS — Z800 Family history of malignant neoplasm of digestive organs: Secondary | ICD-10-CM | POA: Diagnosis not present

## 2015-11-29 DIAGNOSIS — R194 Change in bowel habit: Secondary | ICD-10-CM | POA: Diagnosis not present

## 2015-12-12 DIAGNOSIS — L821 Other seborrheic keratosis: Secondary | ICD-10-CM | POA: Diagnosis not present

## 2016-01-05 DIAGNOSIS — R311 Benign essential microscopic hematuria: Secondary | ICD-10-CM | POA: Diagnosis not present

## 2016-04-08 DIAGNOSIS — B029 Zoster without complications: Secondary | ICD-10-CM | POA: Diagnosis not present

## 2016-04-10 DIAGNOSIS — Z23 Encounter for immunization: Secondary | ICD-10-CM | POA: Diagnosis not present

## 2016-04-22 DIAGNOSIS — J40 Bronchitis, not specified as acute or chronic: Secondary | ICD-10-CM | POA: Diagnosis not present

## 2016-04-24 DIAGNOSIS — G43801 Other migraine, not intractable, with status migrainosus: Secondary | ICD-10-CM | POA: Diagnosis not present

## 2016-04-24 DIAGNOSIS — H40013 Open angle with borderline findings, low risk, bilateral: Secondary | ICD-10-CM | POA: Diagnosis not present

## 2016-05-01 DIAGNOSIS — H40023 Open angle with borderline findings, high risk, bilateral: Secondary | ICD-10-CM | POA: Diagnosis not present

## 2016-06-04 DIAGNOSIS — J40 Bronchitis, not specified as acute or chronic: Secondary | ICD-10-CM | POA: Diagnosis not present

## 2016-06-05 DIAGNOSIS — H40023 Open angle with borderline findings, high risk, bilateral: Secondary | ICD-10-CM | POA: Diagnosis not present

## 2016-06-21 DIAGNOSIS — R062 Wheezing: Secondary | ICD-10-CM | POA: Diagnosis not present

## 2016-07-03 DIAGNOSIS — R062 Wheezing: Secondary | ICD-10-CM | POA: Diagnosis not present

## 2016-07-04 DIAGNOSIS — H40023 Open angle with borderline findings, high risk, bilateral: Secondary | ICD-10-CM | POA: Diagnosis not present

## 2016-07-15 DIAGNOSIS — J320 Chronic maxillary sinusitis: Secondary | ICD-10-CM | POA: Diagnosis not present

## 2016-07-15 DIAGNOSIS — R062 Wheezing: Secondary | ICD-10-CM | POA: Diagnosis not present

## 2016-10-14 ENCOUNTER — Encounter: Payer: Self-pay | Admitting: Adult Health

## 2016-10-14 ENCOUNTER — Ambulatory Visit (HOSPITAL_BASED_OUTPATIENT_CLINIC_OR_DEPARTMENT_OTHER): Payer: PPO | Admitting: Adult Health

## 2016-10-14 ENCOUNTER — Other Ambulatory Visit (HOSPITAL_BASED_OUTPATIENT_CLINIC_OR_DEPARTMENT_OTHER): Payer: PPO

## 2016-10-14 VITALS — BP 120/48 | HR 72 | Temp 98.1°F | Resp 18 | Ht 66.5 in | Wt 155.7 lb

## 2016-10-14 DIAGNOSIS — C50211 Malignant neoplasm of upper-inner quadrant of right female breast: Secondary | ICD-10-CM

## 2016-10-14 DIAGNOSIS — Z171 Estrogen receptor negative status [ER-]: Principal | ICD-10-CM

## 2016-10-14 DIAGNOSIS — Z853 Personal history of malignant neoplasm of breast: Secondary | ICD-10-CM

## 2016-10-14 LAB — COMPREHENSIVE METABOLIC PANEL
ALBUMIN: 4.2 g/dL (ref 3.5–5.0)
ALK PHOS: 62 U/L (ref 40–150)
ALT: 14 U/L (ref 0–55)
AST: 18 U/L (ref 5–34)
Anion Gap: 8 mEq/L (ref 3–11)
BUN: 19.9 mg/dL (ref 7.0–26.0)
CO2: 27 meq/L (ref 22–29)
Calcium: 9.5 mg/dL (ref 8.4–10.4)
Chloride: 106 mEq/L (ref 98–109)
Creatinine: 1 mg/dL (ref 0.6–1.1)
EGFR: 55 mL/min/{1.73_m2} — ABNORMAL LOW (ref >90)
GLUCOSE: 94 mg/dL (ref 70–140)
POTASSIUM: 4.1 meq/L (ref 3.5–5.1)
SODIUM: 141 meq/L (ref 136–145)
TOTAL PROTEIN: 7.1 g/dL (ref 6.4–8.3)
Total Bilirubin: 0.28 mg/dL (ref 0.20–1.20)

## 2016-10-14 LAB — CBC WITH DIFFERENTIAL/PLATELET
BASO%: 0.3 % (ref 0.0–2.0)
BASOS ABS: 0 10*3/uL (ref 0.0–0.1)
EOS ABS: 0.1 10*3/uL (ref 0.0–0.5)
EOS%: 1.4 % (ref 0.0–7.0)
HCT: 39.2 % (ref 34.8–46.6)
HEMOGLOBIN: 13.2 g/dL (ref 11.6–15.9)
LYMPH#: 1.4 10*3/uL (ref 0.9–3.3)
LYMPH%: 36.2 % (ref 14.0–49.7)
MCH: 32.8 pg (ref 25.1–34.0)
MCHC: 33.8 g/dL (ref 31.5–36.0)
MCV: 97.2 fL (ref 79.5–101.0)
MONO#: 0.3 10*3/uL (ref 0.1–0.9)
MONO%: 8.2 % (ref 0.0–14.0)
NEUT#: 2.1 10*3/uL (ref 1.5–6.5)
NEUT%: 53.9 % (ref 38.4–76.8)
Platelets: 166 10*3/uL (ref 145–400)
RBC: 4.04 10*6/uL (ref 3.70–5.45)
RDW: 13.1 % (ref 11.2–14.5)
WBC: 3.8 10*3/uL — ABNORMAL LOW (ref 3.9–10.3)

## 2016-10-14 NOTE — Progress Notes (Signed)
CLINIC:  Survivorship   REASON FOR VISIT:  Routine follow-up for history of breast cancer.   BRIEF ONCOLOGIC HISTORY:    Breast cancer of upper-inner quadrant of right female breast ([place])   08/03/2013 Initial Diagnosis    Breast cancer of upper-inner quadrant of right female breast; invasive ductal carcinoma with ductal carcinoma in situ. The tumor was ER negative PR negative HER-2/neu negative with a proliferation marker Ki-67 13% ([place])      09/07/2013 Surgery    Bilateral Mastectomy and SLN biopsy (Dr.Haywood) Left Breast: Benign; Right Breast IDC with DCIS Grade 3 3 SLN neg ER 0%; PR 0%; Her2: ratio 1.4 (Neg)      09/27/2013 - 01/24/2014 Chemotherapy    FEC adjuvant chemo given on day 1 of a 21 day cycle with Neulasta on day 2 for granulocyte support.  A total of 6 cycles were given.        01/31/2014 - 01/31/2014 Chemotherapy    Taxol Carboplatin x 1 cycle.  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       02/08/2014 - 02/15/2014 Chemotherapy    Taxol alone given on 9/8 and 9/14, however discontinued due to increasing grade II peripheral neuropathy.      02/21/2014 - 02/21/2014 Chemotherapy    Gemcitabine weekly given x 1, but discontinued due to neutropenia.         Cancer of right breast [facility])     INTERVAL HISTORY:  Wendy [name] presents to the Survivorship Clinic today for routine follow-up for her history of breast cancer.  Overall, she reports feeling quite well. She is playing tennis daily.  She sees Dr. Nicki Reaper once a year.  She is up to date with colonoscopy, DEXA, skin checks.      REVIEW OF SYSTEMS:  Review of Systems  Constitutional: Negative for appetite change, chills, diaphoresis, fatigue, fever and unexpected weight change.  HENT:   Negative for hearing loss and lump/mass.   Eyes: Negative for eye problems and icterus.  Respiratory: Negative for chest tightness, cough and shortness of breath.   Cardiovascular: Negative for chest pain, leg swelling and  palpitations.  Gastrointestinal: Negative for abdominal pain.  Endocrine: Negative for hot flashes.  Genitourinary: Negative for difficulty urinating, dyspareunia, vaginal bleeding and vaginal discharge.   Musculoskeletal: Negative for arthralgias, back pain and gait problem.  Skin: Negative for itching.  Neurological: Negative for dizziness, extremity weakness, gait problem and light-headedness.  Hematological: Negative for adenopathy. Does not bruise/bleed easily.  Psychiatric/Behavioral: Negative for depression. The patient is not nervous/anxious.   Breast: Denies any new nodularity, masses, tenderness, nipple changes, or nipple discharge.        PAST MEDICAL/SURGICAL HISTORY:  Past Medical History:  Diagnosis Date  . Asthma    as a child  . Asthmatic bronchitis    "about q yr" (09/07/2013)  . Breast cancer ([place])    "right breast"   . Family history of anesthesia complication    "daughter PONV; long time in recovery" (09/07/2013)  . Pneumonia    "several times; usually follows asthmatic bronchitis" (09/07/2013)  . Sinus headache    "maybe 2-3 times/month"   Past Surgical History:  Procedure Laterality Date  . BREAST BIOPSY Right 07/2013  . DILATION AND CURETTAGE OF UTERUS     S/P miscarriage  . FINGER FRACTURE SURGERY Left 2010   "middle finger spiral fx; little finger broken; tissue damage"  . MASTECTOMY COMPLETE / SIMPLE  09/07/2013  . MASTECTOMY COMPLETE / SIMPLE W/  SENTINEL NODE BIOPSY Right 09/07/2013  . MASTECTOMY W/ SENTINEL NODE BIOPSY Bilateral 09/07/2013   Procedure: BILATERAL MASTECTOMY WITH RIGHT SENTINEL LYMPH NODE BIOPSY;  Surgeon: [name], MD;  Location: [place];  Service: General;  Laterality: Bilateral;  . PORT-A-CATH REMOVAL Right 04/19/2014   Procedure: REMOVAL PORT-A-CATH;  Surgeon: Fanny Skates, MD;  Location: [place];  Service: General;  Laterality: Right;  . PORTACATH PLACEMENT Right 09/07/2013  . PORTACATH PLACEMENT Right 09/07/2013     Procedure: INSERTION PORT-A-CATH;  Surgeon: [name], MD;  Location: [place];  Service: General;  Laterality: Right;  . TONSILLECTOMY    . TUBAL LIGATION  1977  . WRIST FRACTURE SURGERY Left 2010     ALLERGIES:  No Known Allergies   CURRENT MEDICATIONS:  Outpatient Encounter Prescriptions as of 10/14/2016  Medication Sig  . Calcium Carbonate-Vitamin D (CALCIUM PLUS VITAMIN D PO) Take 1 tablet by mouth 2 (two) times daily.   No facility-administered encounter medications on file as of 10/14/2016.      ONCOLOGIC FAMILY HISTORY:  Family History  Problem Relation Age of Onset  . Bladder Cancer Sister 48       worked in [employer], around 2nd hand smoke all her life  . Diabetes Sister   . Colon cancer Brother 38       non-smoker  . Diabetes Brother   . Colon cancer Maternal Uncle 61  . Osteoporosis Mother   . Parkinson's disease Mother   . Diabetes Mother   . Rheum arthritis Father   . Diabetes Father   . Breast cancer Paternal Aunt        dx over 35  . Diabetes Maternal Grandmother   . Diabetes Maternal Grandfather   . Uterine cancer Other 50  . Colon cancer Maternal Aunt        dx in her 25s-70s  . Breast cancer Cousin        paternal cousin dx in her 67s-70s    GENETIC COUNSELING/TESTING: 08/2013: She had negative genetic testing through [facility]. The 22 genes on the panel were ATM, BARD1, BRCA1, BRCA2, BRIP1, CDH1, CHEK2, EPCAM, FANCC, MLH1, MRE11A, MSH2, MSH6, NBN, NF1, PALB2, PMS2, PTEN, RAD50, RAD51C, STK11, and TP53.  SOCIAL HISTORY:  LEGACIE DILLINGHAM is married and lives with her husband in [place], [place].  She has 2 children and one works as a travel Marine scientist, and the other lives in [place].  Ms. Stillion is currently retired.  She denies any current or history of tobacco, alcohol, or illicit drug use.     PHYSICAL EXAMINATION:  Vital Signs: Vitals:   10/14/16 1505  BP: (!) 120/48  Pulse: 72  Resp: 18  Temp: 98.1 F (36.7 C)   Filed Weights    10/14/16 1505  Weight: 155 lb 11.2 oz (70.6 kg)   General: Well-nourished, well-appearing female in no acute distress.  Accompanied by her husband today.   HEENT: Head is normocephalic.  Pupils equal and reactive to light. Conjunctivae clear without exudate.  Sclerae anicteric. Oral mucosa is pink, moist.  Oropharynx is pink without lesions or erythema.  Lymph: No cervical, supraclavicular, or infraclavicular lymphadenopathy noted on palpation.  Cardiovascular: Regular rate and rhythm.Marland Kitchen Respiratory: Clear to auscultation bilaterally. Chest expansion symmetric; breathing non-labored.  Breast Exam:  S/p bilateral mastectomies, scar tissue is well healed, no nodules, masses, or skin changes. -Axilla: No axillary adenopathy bilaterally.  GI: Abdomen soft and round; non-tender, non-distended. Bowel sounds normoactive. No hepatosplenomegaly.  GU: Deferred.  Neuro: No focal deficits. Steady gait.  Psych: Mood and affect normal and appropriate for situation.  Extremities: No edema. Skin: Warm and dry.  LABORATORY DATA:  None for this visit   DIAGNOSTIC IMAGING:  Most recent mammogram: n/a s/p bilateral mastectomies    ASSESSMENT AND PLAN:  Ms.. Agosto is a pleasant 71 y.o. female with history of Stage IA right breast invasive ductal carcinoma, ER-/PR-/HER2-, diagnosed in 2015, treated with bilateral mastectomy and adjuvant chemotherapy.  She presents to the Survivorship Clinic for surveillance and routine follow-up.   1. History of breast cancer:  Ms. Lybrand is currently clinically and radiographically without evidence of disease or recurrence of breast cancer.  We reviewed her previous treatments.  Some data suggests that due to her previous Doxorubicin treatment, she is at increased risk for cardiovascular disease.  She lives a very healthy lifestyle, however hasn't had her cholesterol checked in a few years.  I recommended that she have this scheduled.   I encouraged her to call me with  any questions or concerns before her next visit at the cancer center, and I would be happy to see her sooner, if needed.    2. Bone health:  Given Ms. Perrault's age, history of breast cancer, she is at risk for bone demineralization. Her last DEXA scan was within the past 2 years and was normal.  She was encouraged to continue with her consumption of foods rich in calcium, as well as continue with her weight-bearing activities.  She was given education on specific food and activities to promote bone health.   3. Cancer screening:  Due to Ms. Basto's history and her age, she should receive screening for skin cancers, colon cancer. She was encouraged to follow-up with her PCP for appropriate cancer screenings.   4. Health maintenance and wellness promotion: Ms. Dizon was encouraged to consume 5-7 servings of fruits and vegetables per day. She was also encouraged to engage in moderate to vigorous exercise for 30 minutes per day most days of the week. She was instructed to limit her alcohol consumption and continue to abstain from tobacco use.    Dispo:  -Return to cancer center in one year for LTS follow up   A total of (30) minutes of face-to-face time was spent with this patient with greater than 50% of that time in counseling and care-coordination.   Gardenia Phlegm, NP Survivorship Program [place] [phone]   Note: PRIMARY CARE PROVIDER Myer Peer, [place] [phone]

## 2016-10-15 ENCOUNTER — Telehealth: Payer: Self-pay

## 2016-10-15 NOTE — Telephone Encounter (Signed)
Called pt to let her know that her lab work was fine. Will send letter to pt of lab results.

## 2016-10-21 ENCOUNTER — Other Ambulatory Visit: Payer: PPO

## 2016-10-21 ENCOUNTER — Encounter: Payer: PPO | Admitting: Nurse Practitioner

## 2016-10-21 ENCOUNTER — Encounter: Payer: PPO | Admitting: Adult Health

## 2017-05-22 DIAGNOSIS — J4520 Mild intermittent asthma, uncomplicated: Secondary | ICD-10-CM | POA: Diagnosis not present

## 2017-05-22 DIAGNOSIS — J22 Unspecified acute lower respiratory infection: Secondary | ICD-10-CM | POA: Diagnosis not present

## 2017-05-22 DIAGNOSIS — Z6823 Body mass index (BMI) 23.0-23.9, adult: Secondary | ICD-10-CM | POA: Diagnosis not present

## 2017-05-29 DIAGNOSIS — J189 Pneumonia, unspecified organism: Secondary | ICD-10-CM | POA: Diagnosis not present

## 2017-05-30 DIAGNOSIS — J189 Pneumonia, unspecified organism: Secondary | ICD-10-CM | POA: Diagnosis not present

## 2017-05-30 DIAGNOSIS — R05 Cough: Secondary | ICD-10-CM | POA: Diagnosis not present

## 2017-06-04 DIAGNOSIS — J4520 Mild intermittent asthma, uncomplicated: Secondary | ICD-10-CM | POA: Diagnosis not present

## 2017-06-04 DIAGNOSIS — Z6823 Body mass index (BMI) 23.0-23.9, adult: Secondary | ICD-10-CM | POA: Diagnosis not present

## 2017-06-13 ENCOUNTER — Encounter: Payer: Self-pay | Admitting: Internal Medicine

## 2017-06-13 ENCOUNTER — Ambulatory Visit: Payer: PPO | Admitting: Internal Medicine

## 2017-06-13 VITALS — BP 112/72 | HR 88 | Ht 66.0 in | Wt 158.8 lb

## 2017-06-13 DIAGNOSIS — J329 Chronic sinusitis, unspecified: Secondary | ICD-10-CM | POA: Diagnosis not present

## 2017-06-13 DIAGNOSIS — R05 Cough: Secondary | ICD-10-CM

## 2017-06-13 DIAGNOSIS — J42 Unspecified chronic bronchitis: Secondary | ICD-10-CM | POA: Diagnosis not present

## 2017-06-13 DIAGNOSIS — R059 Cough, unspecified: Secondary | ICD-10-CM

## 2017-06-13 DIAGNOSIS — J209 Acute bronchitis, unspecified: Secondary | ICD-10-CM | POA: Diagnosis not present

## 2017-06-13 LAB — NITRIC OXIDE: 11800305105: 7

## 2017-06-13 NOTE — Patient Instructions (Addendum)
ICD-10-CM   1. Acute bronchitis, unspecified organism J20.9   2. Chronic bronchitis, unspecified chronic bronchitis type ([place]) J42   3. Chronic sinusitis, unspecified location J32.9    Get sinus ct without contrast Get ct chest without contrast Get spirometry with DLCO Continue current meds given by PCP Myer Peer, MD   followup Next week or two  with myself or app to review results

## 2017-06-13 NOTE — Progress Notes (Signed)
Subjective:     Patient ID: Wendy [name], female   DOB: 1945-06-12, 72 y.o.   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  10/06/2015 Consult for Bronchitis: Patient is here for a consult for bronchitis that started 08/02/2015 that took 6 weeks to resolve.She was treated by Wendy [name] in [name] with 3 prednisone tapers and  3 rounds of antibiotics as well as an  Injection. She was given both Spiriva ,Symbicort and albuterol inhalers in addition to albuterol nebs.She was diagnosed with whooping cough near the end of her illness. CXR done in March was normal.She does have bouts with bronchitis almost every year, but  It has never taken this long to resolve.She is better now, but is here to establish herself as a patient with the practice for the next time this happens. Spirometry done 08/31/2015 was normal.She has no [name] pain, fever, orthopnea, hemoptysis Results for Wendy [name], Wendy [name] (MRN [id number]) as of 1/11/[name] 11:38  Ref. Range 10/06/2015 16:00  Nitric Oxide Unknown 24   2015 Wendy [name] - medial right breast primary; clear lung fields  OV 1/11/[name]  Chief Complaint  Patient presents with  . Follow-up    Pt has had problems with her bronchitis since May 20, 2017 having a productive cough x3 weeks and was told to come here by Wendy [name] for a f/u with Wendy [name]. Pt also has complaints of mild SOB, wheezing. Denies any CP.   72 year old female who has recurrent episodes of acute bronchitis is and currently in the middle of 1 and is here for follow-up.  She is here with her husband.  She tells me that she always has had one episode of winter bronchitis.  Then in 2015 she was diagnosed with stage I but aggressive triple negative breast cancer and is status  post bilateral mastectomy [Wendy [name] at that time showed clear lung parenchyma] followed by chemotherapy.  Currently she is on observation only followed annually by the nurse practitioner at the breast cancer clinic at Wendy [name].  She was last seen by [place] in 2017 May.  At that time nitric oxide test was normal.  She is fairly doing okay up until May 20, 2017 and reviewing her General she developed sore throat and cough.  The following day she saw her primary care physician at Wendy [name] in [place] and was prescribed a cephalosporin and prednisone Nexium and started on trilogy inhaler..  Then on May 29, 2017 due to persistence of cough she went to urgent care and had a [name] x-ray which apparently did not show any pneumonia.  She was given albuterol nebulizer and later cephalosporin was switched to Levaquin.  The following day May 22, 2017 she still had violent cough.  Went to the emergency department was given Wendy [name] and sent home.  She then followed up with primary care Wendy [name] on Wendy 2, [name] and apparently her lungs sounded awful was given 30 days of prednisone.  Started on Singulair and advised to start azithromycin after completing Levaquin.  She started the azithromycin on Wendy [name].  At this point in time she still having a lot of wheezing and doing for nebulizer treatments per day.  And she continues to be symptomatic and she is worried.  RSI cough score is documented below.@ 21 and FeNO is 7ppb and normal.  She is also reporting significant sinus drainage and constantly blowing out her nose with tissue.   Wendy [name] Reflux Symptom Index (> 13-15 suggestive of LPR cough) 06/13/17 \  Hoarseness of problem with voice 4  Clearing  Of Throat 2  Excess throat mucus or feeling of post nasal drip 2  Difficulty swallowing food, liquid or tablets 0  Cough after eating or lying down 3  Breathing difficulties or choking episodes 4  Troublesome or annoying  cough 5  Sensation of something sticking in throat or lump in throat 0  Heartburn, [name] pain, indigestion, or stomach acid coming up 1  TOTAL 21       has a past medical history of Asthma, Asthmatic bronchitis, Breast cancer ([place]), Family history of anesthesia complication, Pneumonia, and Sinus headache.   reports that  has never smoked. she has never used smokeless tobacco.  Past Surgical History:  Procedure Laterality Date  . BREAST BIOPSY Right 07/2013  . DILATION AND CURETTAGE OF UTERUS     S/P miscarriage  . FINGER FRACTURE SURGERY Left 2010   "middle finger spiral fx; little finger broken; tissue damage"  . MASTECTOMY COMPLETE / SIMPLE  09/07/2013  . MASTECTOMY COMPLETE / SIMPLE W/ SENTINEL NODE BIOPSY Right 09/07/2013  . MASTECTOMY W/ SENTINEL NODE BIOPSY Bilateral 09/07/2013   Procedure: BILATERAL MASTECTOMY WITH RIGHT SENTINEL LYMPH NODE BIOPSY;  Surgeon: [name], MD;  Location: [place];  Service: General;  Laterality: Bilateral;  . PORT-A-CATH REMOVAL Right 04/19/2014   Procedure: REMOVAL PORT-A-CATH;  Surgeon: Fanny Skates, MD;  Location: [place];  Service: General;  Laterality: Right;  . PORTACATH PLACEMENT Right 09/07/2013  . PORTACATH PLACEMENT Right 09/07/2013   Procedure: INSERTION PORT-A-CATH;  Surgeon: [name], MD;  Location: [place];  Service: General;  Laterality: Right;  . TONSILLECTOMY    . TUBAL LIGATION  1977  . WRIST FRACTURE SURGERY Left 2010    No Known Allergies  Immunization History  Administered Date(s) Administered  . Influenza, High Dose Seasonal PF 03/03/2017  . Influenza,inj,Quad PF,6+ Mos 04/04/2014, 03/04/2015  . Pneumococcal Conjugate-13 05/04/2015    Family History  Problem Relation Age of Onset  . Bladder Cancer Sister 64       worked in [employer], around 2nd hand smoke all her life  . Diabetes Sister   . Colon cancer Brother 58       non-smoker  . Diabetes Brother   . Colon cancer Maternal Uncle 40  .  Osteoporosis Mother   . Parkinson's disease Mother   . Diabetes Mother   . Rheum arthritis Father   . Diabetes Father   . Breast cancer Paternal Aunt        dx over 54  . Diabetes Maternal Grandmother   . Diabetes Maternal Grandfather   . Uterine cancer Other 50  . Colon cancer Maternal Aunt        dx in her 81s-70s  . Breast cancer Cousin        paternal cousin dx in her 39s-70s     Current Outpatient Medications:  .  albuterol (PROVENTIL) (5 MG/ML) 0.5% nebulizer solution, Take 2.5 mg by nebulization 4 (four) times daily., Disp: , Rfl:  .  azithromycin (ZITHROMAX) 500 MG tablet, Take 500 mg by mouth daily., Disp: , Rfl: 1 .  Calcium Carbonate-Vitamin  D (CALCIUM PLUS VITAMIN D PO), Take 1 tablet by mouth 2 (two) times daily., Disp: , Rfl:  .  esomeprazole (NEXIUM) 40 MG capsule, Take 40 mg by mouth daily., Disp: , Rfl: 3 .  LUMIGAN 0.01 % SOLN, Apply 1 drop into both eyes at bedtime as directed, Disp: , Rfl: 3 .  montelukast (SINGULAIR) 10 MG tablet, Take 1 (one) Tablet daily for wheezing, Disp: , Rfl: 6 .  predniSONE (DELTASONE) 10 MG tablet, Take 10 mg by mouth daily., Disp: , Rfl: 1 .  PROAIR HFA 108 (90 Base) MCG/ACT inhaler, inhale 2 puffs EVERY 4 TO 6 HOURS AS NEEDED FOR cough and congestion, Disp: , Rfl: 1 .  TRELEGY ELLIPTA 100-62.5-25 MCG/INH AEPB, inhale ONE puff daily, Disp: , Rfl: 2    Review of Systems     Objective:   Physical Exam  Constitutional: She is oriented to person, place, and time. She appears well-developed and well-nourished. No distress.  HENT:  Head: Normocephalic and atraumatic.  Right Ear: External ear normal.  Left Ear: External ear normal.  Mouth/Throat: Oropharynx is clear and moist. No oropharyngeal exudate.  Eyes: Conjunctivae and EOM are normal. Pupils are equal, round, and reactive to light. Right eye exhibits no discharge. Left eye exhibits no discharge. No scleral icterus.  Neck: Normal range of motion. Neck supple. No JVD present. No  tracheal deviation present. No thyromegaly present.  Cardiovascular: Normal rate, regular rhythm, normal heart sounds and intact distal pulses. Exam reveals no gallop and no friction rub.  No murmur heard. Pulmonary/[name]: Effort normal and breath sounds normal. No respiratory distress. She has no wheezes. She has no rales. She exhibits no tenderness.  Junky sounding lung ?  Transmitted from upper airway  Abdominal: Soft. Bowel sounds are normal. She exhibits no distension and no mass. There is no tenderness. There is no rebound and no guarding.  Musculoskeletal: Normal range of motion. She exhibits no edema or tenderness.  Lymphadenopathy:    She has no cervical adenopathy.  Neurological: She is alert and oriented to person, place, and time. She has normal reflexes. No cranial nerve deficit. She exhibits normal muscle tone. Coordination normal.  Skin: Skin is warm and dry. No rash noted. She is not diaphoretic. No erythema. No pallor.  Psychiatric: Judgment normal.  Flat affect  Vitals reviewed.  Vitals:   06/13/17 1109  BP: 112/72  Pulse: 88  SpO2: 97%  Weight: 158 lb 12.8 oz (72 kg)  Height: 5\' 6"  (1.676 m)    Estimated body mass index is 25.63 kg/m as calculated from the following:   Height as of this encounter: 5\' 6"  (1.676 m).   Weight as of this encounter: 158 lb 12.8 oz (72 kg).     Assessment:       ICD-10-CM   1. Acute bronchitis, unspecified organism J20.9   2. Chronic bronchitis, unspecified chronic bronchitis type ([place]) J42   3. Chronic sinusitis, unspecified location J32.9    I wonder if she has irritable larynx or vocal cord dysfunction contributing to persistent over exaggerated symptoms.  However it is quite possible that she has chronic sinusitis or pulmonary parenchymal lesion that would warrant imaging.  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    Plan:     Will get a Wendy imaging of the  sinus and [name] and then review.  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 Wil also get spirometry with dlco. Continue current meds  ROV in 1-2 weeks  Wendy. Brand Males, M.D., [facility].C.P Pulmonary and Critical Care Medicine Staff Physician, [place] Director - Interstitial Lung Disease  Program  Pulmonary [place] at [place], [place], [address]  Pager: [phone], If no answer or between  15:00h - 7:00h: call [phone] Telephone: [phone]

## 2017-06-17 ENCOUNTER — Ambulatory Visit (INDEPENDENT_AMBULATORY_CARE_PROVIDER_SITE_OTHER)
Admission: RE | Admit: 2017-06-17 | Discharge: 2017-06-17 | Disposition: A | Discharge status: Home / Self Care | Payer: PPO | Source: Ambulatory Visit | Attending: Internal Medicine | Admitting: Internal Medicine

## 2017-06-17 ENCOUNTER — Ambulatory Visit (INDEPENDENT_AMBULATORY_CARE_PROVIDER_SITE_OTHER)
Admission: RE | Admit: 2017-06-17 | Discharge: 2017-06-17 | Disposition: A | Discharge status: Home / Self Care | Payer: PPO | Source: Ambulatory Visit | Attending: Family Medicine | Admitting: Family Medicine

## 2017-06-17 DIAGNOSIS — R05 Cough: Secondary | ICD-10-CM

## 2017-06-17 DIAGNOSIS — R059 Cough, unspecified: Secondary | ICD-10-CM

## 2017-06-17 DIAGNOSIS — R0981 Nasal congestion: Secondary | ICD-10-CM | POA: Diagnosis not present

## 2017-06-17 DIAGNOSIS — J208 Acute bronchitis due to other specified organisms: Secondary | ICD-10-CM | POA: Diagnosis not present

## 2017-06-20 DIAGNOSIS — H401132 Primary open-angle glaucoma, bilateral, moderate stage: Secondary | ICD-10-CM | POA: Diagnosis not present

## 2017-06-30 DIAGNOSIS — G629 Polyneuropathy, unspecified: Secondary | ICD-10-CM | POA: Diagnosis not present

## 2017-07-01 ENCOUNTER — Telehealth: Payer: Self-pay | Admitting: Internal Medicine

## 2017-07-01 NOTE — Telephone Encounter (Signed)
Rosita Kea are seenig [facility] 07/02/17 for cough fu  Sinus ct - shows acute sinitis -consider ENT Referral v abx/pred CT chest - chronic changes. They are recommending followup    Dr. Brand Males, M.D., [facility].C.P Pulmonary and Critical Care Medicine Staff Physician, [place] Director - Interstitial Lung Disease  Program  Pulmonary [place] at [place], [place], [address]  Pager: [phone], If no answer or between  15:00h - 7:00h: call [phone] Telephone: [phone]

## 2017-07-02 ENCOUNTER — Telehealth: Payer: Self-pay | Admitting: Acute Care

## 2017-07-02 ENCOUNTER — Ambulatory Visit: Payer: PPO | Admitting: Acute Care

## 2017-07-02 ENCOUNTER — Ambulatory Visit: Payer: PPO | Admitting: Internal Medicine

## 2017-07-02 ENCOUNTER — Encounter: Payer: Self-pay | Admitting: Acute Care

## 2017-07-02 VITALS — BP 118/70 | HR 80 | Ht 66.0 in | Wt 162.0 lb

## 2017-07-02 DIAGNOSIS — R05 Cough: Secondary | ICD-10-CM

## 2017-07-02 DIAGNOSIS — J42 Unspecified chronic bronchitis: Secondary | ICD-10-CM

## 2017-07-02 DIAGNOSIS — J40 Bronchitis, not specified as acute or chronic: Secondary | ICD-10-CM | POA: Diagnosis not present

## 2017-07-02 DIAGNOSIS — R059 Cough, unspecified: Secondary | ICD-10-CM

## 2017-07-02 LAB — PULMONARY FUNCTION TEST
DL/VA % pred: 93 %
DL/VA: 4.69 ml/min/mmHg/L
DLCO UNC: 20.07 ml/min/mmHg
DLCO unc % pred: 74 %
FEF 25-75 Pre: 2.75 L/sec
FEF2575-%Pred-Pre: 149 %
FEV1-%PRED-PRE: 96 %
FEV1-Pre: 2.26 L
FEV1FVC-%Pred-Pre: 116 %
FEV6-%PRED-PRE: 86 %
FEV6-PRE: 2.58 L
FEV6FVC-%Pred-Pre: 104 %
FVC-%PRED-PRE: 83 %
FVC-PRE: 2.59 L
PRE FEV6/FVC RATIO: 100 %
Pre FEV1/FVC ratio: 87 %

## 2017-07-02 LAB — NITRIC OXIDE: Nitric Oxide: 10

## 2017-07-02 NOTE — Patient Instructions (Addendum)
It is good to meet you today. FENO today Continue Singulair 10 mg daily for post nasal drip. Consider sips of water instead of throat clearing. HRCT in 6 months  Consider the ENT referral if you get sick again. Follow up with Dr. Geoffery Spruce in 6 months after HRCT so he can discuss. Please contact office for sooner follow up if symptoms do not improve or worsen or seek emergency care

## 2017-07-02 NOTE — Progress Notes (Addendum)
History of Present Illness Wendy [name] is a 72 y.o. female  never smoker with history of breast cancer in 2015 followed by 6 months of chemo.Positive for  asthma as a child, but out grew it by age 44 or 32. 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 She is followed by Dr. Chase Caller for recurrent episodes of recurrent bronchitis.  HPI: 72 year old female who has recurrent episodes of acute bronchitis with current flare.  .  She tells me that she always has had one episode of winter bronchitis.  Then in 2015 she was diagnosed with stage I but aggressive triple negative breast cancer and is status post bilateral mastectomy [CT chest at that time showed clear lung parenchyma] followed by chemotherapy.  Currently she is on observation only followed annually by the nurse practitioner at the breast cancer clinic at [place].  She was last seen by [place] in 2017 May.  At that time nitric oxide test was normal.  She is currently  doing okay up until May 20, 2017 , when  she developed sore throat and cough.  The following day she saw her primary care physician at [facility] in [place] and was prescribed a cephalosporin and prednisone Nexium and started on trilogy inhaler.  Then on May 29, 2017 due to persistence of cough she went to urgent care and had a chest x-ray which apparently did not show any pneumonia.  She was given albuterol nebulizer and later cephalosporin was switched to Levaquin.  The following day May 22, 2017 she still had violent cough.  Went to the emergency department was given Ladona Ridgel and sent home.  She then followed up with primary care Dr. Nicki Reaper on June 04, 2017 and apparently her lungs sounded awful was given 30 days of prednisone.  Started on Singulair and advised to start azithromycin after completing Levaquin.  She started the azithromycin on January 2019.  At this point in time she still having a lot of wheezing and was using   nebulizer treatments multiple times a day.   RSI cough score was 21 and FeNO was 7ppb and normal.  She is also reporting significant sinus drainage with need to blow her nose frequently.  Dr. Chase Caller discussion :  wonder if she has irritable larynx or vocal cord dysfunction contributing to persistent over exaggerated symptoms.  However it is quite possible that she has chronic sinusitis or pulmonary parenchymal lesion that would warrant imaging.  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 Plan after that visit: Will get a CT imaging of the sinus and chest and then review.  If this is reassuring then we will have to put her down to cough neuropathy of irritable larynx. Wil also get spirometry with dlco. Continue current meds  07/02/2017 2 week return OV: Pt. Presents for follow up: Pt. Presents today for follow up. She had PFT's prior to our visit. She states she is better. The bronchitis has resolved.She was given a Trelegy for the acute phase of her illness, and Singulair was added., as well as prednisone. This resolved her bronchitis. She has stopped the Trelegy and the Singulair since.She feels the sinusitis has resolved that was noted on the CT sinus.. She states secretions from her nose are scant. What she does have is white to cream colored. She is clearing her throat a lot today. When asked she  states she does feel she has post nasal gtt. She denies fever, chest pain, orthopnea or hemoptysis.We discussed the Ct results and the COPD results. FENO was 10 ppb. She has not had to use Pro Air or Proventil nebs as often.    Test Results: FENO 07/02/2017>> 10 ppb  PFT's 07/02/2017 FVC 83% predicted FEV1 96% predicted F/F ratio>> 116 DLCO>> 74%  CT Sinus 06/17/2017>> Bubbly opacity in the sphenoid sinus which could reflect acute sinusitis. Mild mucosal thickening in the maxillary alveolar recesses which may be  inconsequential  CT Chest 06/17/2017>> No acute cardiopulmonary abnormalities identified. Mild lower lobe peripheral and basilar predominant interstitial reticulation identified and mild bronchiectasis. Findings are nonspecific and may be the sequelae of recurrent inflammation/infection. Chronic interstitial lung disease not excluded. Consider follow-up imaging in 6-12 months with high-resolution CT of the chest to assess for any temporal change in the appearance of the lungs.  CBC Latest Ref Rng & Units 10/14/2016 10/23/2015 05/01/2015  WBC 3.9 - 10.3 10e3/uL 3.8(L) 2.9(L) 3.5(L)  Hemoglobin 11.6 - 15.9 g/dL 13.2 12.8 13.2  Hematocrit 34.8 - 46.6 % 39.2 38.2 39.7  Platelets 145 - 400 10e3/uL 166 172 182    BMP Latest Ref Rng & Units 10/14/2016 10/23/2015 12/07/2014  Glucose 70 - 140 mg/dl 94 111 92  BUN 7.0 - 26.0 mg/dL 19.9 13.6 21.1  Creatinine 0.6 - 1.1 mg/dL 1.0 0.8 0.8  Sodium 136 - 145 mEq/L 141 140 141  Potassium 3.5 - 5.1 mEq/L 4.1 4.3 4.6  Chloride 96 - 112 mEq/L - - -  CO2 22 - 29 mEq/L 27 26 28   Calcium 8.4 - 10.4 mg/dL 9.5 9.0 9.9      Ct Chest Wo Contrast  Result Date: 06/18/2017 CLINICAL DATA:  Dry cough for 1 month.  Bronchitis. EXAM: CT CHEST WITHOUT CONTRAST TECHNIQUE: Multidetector CT imaging of the chest was performed following the standard protocol without IV contrast. COMPARISON:  CT chest 08/19/2013 FINDINGS: Cardiovascular: Normal heart size. Aortic atherosclerosis. Calcification within the RCA coronary artery identified. Mediastinum/Nodes: No enlarged mediastinal or axillary lymph nodes. Thyroid gland, trachea, and esophagus demonstrate no significant findings. Lungs/Pleura: Pleuroparenchymal scarring within the a posterolateral right upper lobe identified, image 24 of series 3. Mild lower lobe and peripheral predominant interstitial reticulation identified. There is mild bronchiectasis identified within the posteromedial right lower lobe. No airspace consolidation  identified. Upper Abdomen: No acute abnormality. Musculoskeletal: Scoliosis and degenerative disc disease identified. IMPRESSION: 1. No acute cardiopulmonary abnormalities identified. 2. Mild lower lobe peripheral and basilar predominant interstitial reticulation identified and mild bronchiectasis. Findings are nonspecific and may be the sequelae of recurrent inflammation/infection. Chronic interstitial lung disease not excluded. Consider follow-up imaging in 6-12 months with high-resolution CT of the chest to assess for any temporal change in the appearance of the lungs. 3.  Aortic Atherosclerosis (ICD10-I70.0). 4. RCA coronary artery calcification. Electronically Signed   By: Kerby Moors M.D.   On: 06/18/2017 09:35   Ct Maxillofacial Ltd Wo Cm  Result Date: 06/17/2017 CLINICAL DATA:  72 year old female with nasal congestion. Cough for 1 month. Multiple rounds of antibiotics. EXAM: CT PARANASAL SINUS LIMITED WITHOUT CONTRAST TECHNIQUE: Non-contiguous multidetector CT images of the paranasal sinuses were obtained in a single plane without contrast. COMPARISON:  None. FINDINGS: Negative visible noncontrast brain parenchyma, orbit soft tissues and face soft tissues. Mild mucosal thickening is evident in both maxillary alveolar recesses. The maxillary sinuses and OMCs otherwise appear patent. Visible frontal and ethmoid sinuses appear clear. There is partially  visible bubbly opacity and mucosal thickening in the sphenoid sinus (series 3, image 7). IMPRESSION: Bubbly opacity in the sphenoid sinus which could reflect acute sinusitis. Mild mucosal thickening in the maxillary alveolar recesses which may be inconsequential. Electronically Signed   By: Genevie Ann M.D.   On: 06/17/2017 16:34     Past medical hx Past Medical History:  Diagnosis Date  . Asthma    as a child  . Asthmatic bronchitis    "about q yr" (09/07/2013)  . Breast cancer ([place])    "right breast"   . Family history of anesthesia complication     "daughter PONV; long time in recovery" (09/07/2013)  . Pneumonia    "several times; usually follows asthmatic bronchitis" (09/07/2013)  . Sinus headache    "maybe 2-3 times/month"     Social History   Tobacco Use  . Smoking status: Never Smoker  . Smokeless tobacco: Never Used  Substance Use Topics  . Alcohol use: No    Alcohol/week: 0.0 oz  . Drug use: No    Ms.Mccuistion reports that  has never smoked. she has never used smokeless tobacco. She reports that she does not drink alcohol or use drugs.  Tobacco Cessation: Never smoker  Past surgical hx, Family hx, Social hx all reviewed.  Current Outpatient Medications on File Prior to Visit  Medication Sig  . esomeprazole (NEXIUM) 40 MG capsule Take 40 mg by mouth daily.  Marland Kitchen gabapentin (NEURONTIN) 100 MG capsule Take 100 mg by mouth daily. Starting once a day and increasing  . LUMIGAN 0.01 % SOLN Apply 1 drop into both eyes at bedtime as directed  . albuterol (PROVENTIL) (5 MG/ML) 0.5% nebulizer solution Take 2.5 mg by nebulization 4 (four) times daily.  . Calcium Carbonate-Vitamin D (CALCIUM PLUS VITAMIN D PO) Take 1 tablet by mouth 2 (two) times daily.  . montelukast (SINGULAIR) 10 MG tablet Take 1 (one) Tablet daily for wheezing  . PROAIR HFA 108 (90 Base) MCG/ACT inhaler inhale 2 puffs EVERY 4 TO 6 HOURS AS NEEDED FOR cough and congestion  . TRELEGY ELLIPTA 100-62.5-25 MCG/INH AEPB inhale ONE puff daily   No current facility-administered medications on file prior to visit.      No Known Allergies  Review Of Systems:  Constitutional:   No  weight loss, night sweats,  Fevers, chills, fatigue, or  lassitude.  HEENT:   No headaches,  Difficulty swallowing,  Tooth/dental problems, or  Sore throat,                No sneezing, itching, ear ache, nasal congestion,+ post nasal drip,   CV:  No chest pain,  Orthopnea, PND, swelling in lower extremities, anasarca, dizziness, palpitations, syncope.   GI  No heartburn, indigestion,  abdominal pain, nausea, vomiting, diarrhea, change in bowel habits, loss of appetite, bloody stools.   Resp: No shortness of breath with exertion or at rest.  No excess mucus, + but improving  productive cough,  + but improving  non-productive cough,  No coughing up of blood.  No change in color of mucus.  No wheezing.  No chest wall deformity  Skin: no rash or lesions.  GU: no dysuria, change in color of urine, no urgency or frequency.  No flank pain, no hematuria   MS:  No joint pain or swelling.  No decreased range of motion.  No back pain.  Psych:  No change in mood or affect. No depression or anxiety.  No memory loss.  Vital Signs BP 118/70 (BP Location: Left Arm, Cuff Size: Normal)   Pulse 80   Ht 5\' 6"  (1.676 m)   Wt 162 lb (73.5 kg)   SpO2 99%   BMI 26.15 kg/m    Physical Exam:  General- No distress,  A&Ox3, pleasant ENT: No sinus tenderness, TM clear, pale nasal mucosa, no oral exudate,+ post nasal drip, no LAN Cardiac: S1, S2, regular rate and rhythm, no murmur Chest: No wheeze/ rales/ dullness; no accessory muscle use, no nasal flaring, no sternal retractions Abd.: Soft Non-tender, non-distended, BS + Ext: No clubbing cyanosis, edema Neuro:  normal strength Skin: No rashes, warm and dry Psych: normal mood and behavior   Assessment/Plan  Chronic bronchitis (HCC) FeNo was 10 today Cough resolved with use of Trelegy/ Singulair ( Which she has stopped taking) Still has PND with throat clearing Has not had to use rescue inhaler or nebs as often Plan: FENO today Continue Singulair 10 mg daily for post nasal drip. Consider sips of water instead of throat clearing. HRCT in 6 months  Consider the ENT referral if you get sick again. Follow up with Dr. Geoffery Spruce in 6 months after HRCT so he can discuss. Please contact office for sooner follow up if symptoms do not improve or worsen or seek emergency care    DLCO of 74% [place] 79% Plan: HRCT in 6 months Magdalen Spatz, NP 07/02/2017  9:14 PM

## 2017-07-02 NOTE — Telephone Encounter (Signed)
Making sure you placed the 6 month HRCT scan per Dr. Golden Pop preferences. Thanks so much

## 2017-07-02 NOTE — Assessment & Plan Note (Signed)
FeNo was 10 today Cough resolved with use of Trelegy/ Singulair ( Which she has stopped taking) Still has PND with throat clearing Has not had to use rescue inhaler or nebs as often Plan: FENO today Continue Singulair 10 mg daily for post nasal drip. Consider sips of water instead of throat clearing. HRCT in 6 months  Consider the ENT referral if you get sick again. Follow up with Dr. Geoffery Spruce in 6 months after HRCT so he can discuss. Please contact office for sooner follow up if symptoms do not improve or worsen or seek emergency care

## 2017-07-03 NOTE — Telephone Encounter (Signed)
Yes, Raquel Sarna and I placed order with MR preferences. Nothing further is needed.

## 2017-07-21 DIAGNOSIS — H401132 Primary open-angle glaucoma, bilateral, moderate stage: Secondary | ICD-10-CM | POA: Diagnosis not present

## 2017-09-08 DIAGNOSIS — B0223 Postherpetic polyneuropathy: Secondary | ICD-10-CM | POA: Diagnosis not present

## 2017-09-08 DIAGNOSIS — Z6823 Body mass index (BMI) 23.0-23.9, adult: Secondary | ICD-10-CM | POA: Diagnosis not present

## 2017-10-20 ENCOUNTER — Telehealth: Payer: Self-pay | Admitting: Adult Health

## 2017-10-20 ENCOUNTER — Encounter: Payer: Self-pay | Admitting: Adult Health

## 2017-10-20 ENCOUNTER — Inpatient Hospital Stay: Payer: PPO | Attending: Adult Health | Admitting: Adult Health

## 2017-10-20 ENCOUNTER — Inpatient Hospital Stay: Payer: PPO

## 2017-10-20 ENCOUNTER — Other Ambulatory Visit: Payer: Self-pay | Admitting: Adult Health

## 2017-10-20 VITALS — BP 116/46 | HR 71 | Temp 98.2°F | Resp 18 | Ht 66.0 in | Wt 163.5 lb

## 2017-10-20 DIAGNOSIS — C50211 Malignant neoplasm of upper-inner quadrant of right female breast: Secondary | ICD-10-CM

## 2017-10-20 DIAGNOSIS — Z171 Estrogen receptor negative status [ER-]: Principal | ICD-10-CM

## 2017-10-20 DIAGNOSIS — Z9221 Personal history of antineoplastic chemotherapy: Secondary | ICD-10-CM

## 2017-10-20 DIAGNOSIS — Z853 Personal history of malignant neoplasm of breast: Secondary | ICD-10-CM | POA: Diagnosis not present

## 2017-10-20 DIAGNOSIS — T451X5A Adverse effect of antineoplastic and immunosuppressive drugs, initial encounter: Secondary | ICD-10-CM

## 2017-10-20 DIAGNOSIS — Z9013 Acquired absence of bilateral breasts and nipples: Secondary | ICD-10-CM | POA: Insufficient documentation

## 2017-10-20 DIAGNOSIS — G620 Drug-induced polyneuropathy: Secondary | ICD-10-CM

## 2017-10-20 LAB — CMP (CANCER CENTER ONLY)
ALK PHOS: 60 U/L (ref 40–150)
ALT: 17 U/L (ref 0–55)
AST: 19 U/L (ref 5–34)
Albumin: 4 g/dL (ref 3.5–5.0)
Anion gap: 7 (ref 3–11)
BUN: 18 mg/dL (ref 7–26)
CHLORIDE: 106 mmol/L (ref 98–109)
CO2: 28 mmol/L (ref 22–29)
CREATININE: 0.89 mg/dL (ref 0.60–1.10)
Calcium: 9.4 mg/dL (ref 8.4–10.4)
GFR, Est AFR Am: >60 mL/min (ref >60)
Glucose, Bld: 96 mg/dL (ref 70–140)
Potassium: 4.3 mmol/L (ref 3.5–5.1)
Sodium: 141 mmol/L (ref 136–145)
Total Bilirubin: 0.3 mg/dL (ref 0.2–1.2)
Total Protein: 6.7 g/dL (ref 6.4–8.3)

## 2017-10-20 LAB — CBC WITH DIFFERENTIAL (CANCER CENTER ONLY)
BASOS ABS: 0 10*3/uL (ref 0.0–0.1)
Basophils Relative: 0 %
EOS PCT: 0 %
Eosinophils Absolute: 0 10*3/uL (ref 0.0–0.5)
HEMATOCRIT: 36.9 % (ref 34.8–46.6)
Hemoglobin: 12.4 g/dL (ref 11.6–15.9)
LYMPHS ABS: 1.2 10*3/uL (ref 0.9–3.3)
Lymphocytes Relative: 42 %
MCH: 32.2 pg (ref 25.1–34.0)
MCHC: 33.5 g/dL (ref 31.5–36.0)
MCV: 95.9 fL (ref 79.5–101.0)
MONOS PCT: 11 %
Monocytes Absolute: 0.3 10*3/uL (ref 0.1–0.9)
Neutro Abs: 1.3 10*3/uL — ABNORMAL LOW (ref 1.5–6.5)
Neutrophils Relative %: 47 %
PLATELETS: 152 10*3/uL (ref 145–400)
RBC: 3.85 MIL/uL (ref 3.70–5.45)
RDW: 14.2 % (ref 11.2–14.5)
WBC: 2.9 10*3/uL — AB (ref 3.9–10.3)

## 2017-10-20 NOTE — Progress Notes (Signed)
CLINIC:  Survivorship   REASON FOR VISIT:  Routine follow-up for history of breast cancer.   BRIEF ONCOLOGIC HISTORY:    Breast cancer of upper-inner quadrant of right female breast ([place])   08/03/2013 Initial Diagnosis    Breast cancer of upper-inner quadrant of right female breast; invasive ductal carcinoma with ductal carcinoma in situ. The tumor was ER negative PR negative HER-2/neu negative with a proliferation marker Ki-67 13% ([place])      09/07/2013 Surgery    Bilateral Mastectomy and SLN biopsy (Dr.Haywood) Left Breast: Benign; Right Breast IDC with DCIS Grade 3 3 SLN neg ER 0%; PR 0%; Her2: ratio 1.4 (Neg)      09/27/2013 - 01/24/2014 Chemotherapy    FEC adjuvant chemo given on day 1 of a 21 day cycle with Neulasta on day 2 for granulocyte support.  A total of 6 cycles were given.        01/31/2014 - 01/31/2014 Chemotherapy    Taxol Carboplatin x 1 cycle.  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       02/08/2014 - 02/15/2014 Chemotherapy    Taxol alone given on 9/8 and 9/14, however discontinued due to increasing grade II peripheral neuropathy.      02/21/2014 - 02/21/2014 Chemotherapy    Gemcitabine weekly given x 1, but discontinued due to neutropenia.         Cancer of right breast [facility])     INTERVAL HISTORY:  Ms. Biagini presents to the Survivorship Clinic today for routine follow-up for her history of breast cancer.  Overall, she reports feeling quite well. She is accompanied by her daughter, Berline Chough today.  Kamayah is doing moderately well today.  She continues to play tennis.  She denies any new issues.    Cyndel notes she has had shingles four times!  She also gets a bad case of bronchitis once a year.  She did have a CT chest done by Dr. Chase Caller that needs to be repeated.  Her lungs and breathing have improved since she saw him.     REVIEW OF SYSTEMS:  Review of Systems  Constitutional: Negative for appetite change, chills, fever and unexpected weight  change.  HENT:   Negative for hearing loss and lump/mass.   Eyes: Negative for eye problems and icterus.  Respiratory: Negative for chest tightness, cough and shortness of breath.   Cardiovascular: Negative for chest pain, leg swelling and palpitations.  Gastrointestinal: Negative for abdominal distention, abdominal pain, blood in stool, constipation, diarrhea, nausea and vomiting.  Endocrine: Negative for hot flashes.  Skin: Negative for itching and rash.  Neurological: Negative for dizziness, extremity weakness, headaches and numbness.  Hematological: Negative for adenopathy. Does not bruise/bleed easily.  Psychiatric/Behavioral: Negative for depression. The patient is not nervous/anxious.    Breast: Denies any new nodularity, masses, tenderness, nipple changes, or nipple discharge.       PAST MEDICAL/SURGICAL HISTORY:  Past Medical History:  Diagnosis Date  . Asthma    as a child  . Asthmatic bronchitis    "about q yr" (09/07/2013)  . Breast cancer ([place])    "right breast"   . Family history of anesthesia complication    "daughter PONV; long time in recovery" (09/07/2013)  . Pneumonia    "several times; usually follows asthmatic bronchitis" (09/07/2013)  . Sinus headache    "maybe 2-3 times/month"   Past Surgical History:  Procedure Laterality Date  . BREAST BIOPSY Right 07/2013  . DILATION AND CURETTAGE OF UTERUS  S/P miscarriage  . FINGER FRACTURE SURGERY Left 2010   "middle finger spiral fx; little finger broken; tissue damage"  . MASTECTOMY COMPLETE / SIMPLE  09/07/2013  . MASTECTOMY COMPLETE / SIMPLE W/ SENTINEL NODE BIOPSY Right 09/07/2013  . MASTECTOMY W/ SENTINEL NODE BIOPSY Bilateral 09/07/2013   Procedure: BILATERAL MASTECTOMY WITH RIGHT SENTINEL LYMPH NODE BIOPSY;  Surgeon: [name], MD;  Location: [place];  Service: General;  Laterality: Bilateral;  . PORT-A-CATH REMOVAL Right 04/19/2014   Procedure: REMOVAL PORT-A-CATH;  Surgeon: Fanny Skates, MD;  Location:  [place];  Service: General;  Laterality: Right;  . PORTACATH PLACEMENT Right 09/07/2013  . PORTACATH PLACEMENT Right 09/07/2013   Procedure: INSERTION PORT-A-CATH;  Surgeon: [name], MD;  Location: [place];  Service: General;  Laterality: Right;  . TONSILLECTOMY    . TUBAL LIGATION  1977  . WRIST FRACTURE SURGERY Left 2010     ALLERGIES:  No Known Allergies   CURRENT MEDICATIONS:  Outpatient Encounter Medications as of 10/20/2017  Medication Sig  . LUMIGAN 0.01 % SOLN Apply 1 drop into both eyes at bedtime as directed  . [DISCONTINUED] albuterol (PROVENTIL) (5 MG/ML) 0.5% nebulizer solution Take 2.5 mg by nebulization 4 (four) times daily.  . [DISCONTINUED] Calcium Carbonate-Vitamin D (CALCIUM PLUS VITAMIN D PO) Take 1 tablet by mouth 2 (two) times daily.  . [DISCONTINUED] esomeprazole (NEXIUM) 40 MG capsule Take 40 mg by mouth daily.  . [DISCONTINUED] gabapentin (NEURONTIN) 100 MG capsule Take 100 mg by mouth daily. Starting once a day and increasing  . [DISCONTINUED] montelukast (SINGULAIR) 10 MG tablet Take 1 (one) Tablet daily for wheezing  . [DISCONTINUED] PROAIR HFA 108 (90 Base) MCG/ACT inhaler inhale 2 puffs EVERY 4 TO 6 HOURS AS NEEDED FOR cough and congestion  . [DISCONTINUED] TRELEGY ELLIPTA 100-62.5-25 MCG/INH AEPB inhale ONE puff daily   No facility-administered encounter medications on file as of 10/20/2017.      ONCOLOGIC FAMILY HISTORY:  Family History  Problem Relation Age of Onset  . Bladder Cancer Sister 68       worked in [employer], around 2nd hand smoke all her life  . Diabetes Sister   . Colon cancer Brother 27       non-smoker  . Diabetes Brother   . Colon cancer Maternal Uncle 60  . Osteoporosis Mother   . Parkinson's disease Mother   . Diabetes Mother   . Rheum arthritis Father   . Diabetes Father   . Breast cancer Paternal Aunt        dx over 94  . Diabetes Maternal Grandmother   . Diabetes Maternal Grandfather   . Uterine  cancer Other 50  . Colon cancer Maternal Aunt        dx in her 55s-70s  . Breast cancer Cousin        paternal cousin dx in her 65s-70s      SOCIAL HISTORY:  Social History   Socioeconomic History  . Marital status: Married    Spouse name: Not on file  . Number of children: 2  . Years of education: Not on file  . Highest education level: Not on file  Occupational History  . Occupation: retired  Scientific laboratory technician  . Financial resource strain: Not on file  . Food insecurity:    Worry: Not on file    Inability: Not on file  . Transportation needs:    Medical: Not on file    Non-medical: Not on file  Tobacco Use  . Smoking status: Never Smoker  . Smokeless tobacco: Never Used  Substance and Sexual Activity  . Alcohol use: No    Alcohol/week: 0.0 oz  . Drug use: No  . Sexual activity: Never  Lifestyle  . Physical activity:    Days per week: Not on file    Minutes per session: Not on file  . Stress: Not on file  Relationships  . Social connections:    Talks on phone: Not on file    Gets together: Not on file    Attends religious service: Not on file    Active member of club or organization: Not on file    Attends meetings of clubs or organizations: Not on file    Relationship status: Not on file  . Intimate partner violence:    Fear of current or ex partner: Not on file    Emotionally abused: Not on file    Physically abused: Not on file    Forced sexual activity: Not on file  Other Topics Concern  . Not on file  Social History Narrative  . Not on file      PHYSICAL EXAMINATION:  Vital Signs: Vitals:   10/20/17 1510  BP: (!) 116/46  Pulse: 71  Resp: 18  Temp: 98.2 F (36.8 C)  SpO2: 100%   Filed Weights   10/20/17 1510  Weight: 163 lb 8 oz (74.2 kg)   General: Well-nourished, well-appearing female in no acute distress.  Unaccompanied today.   HEENT: Head is normocephalic.  Pupils equal and reactive to light. Conjunctivae clear without exudate.   Sclerae anicteric. Oral mucosa is pink, moist.  Oropharynx is pink without lesions or erythema.  Lymph: No cervical, supraclavicular, or infraclavicular lymphadenopathy noted on palpation.  Cardiovascular: Regular rate and rhythm.Marland Kitchen Respiratory: Clear to auscultation bilaterally. Chest expansion symmetric; breathing non-labored.  Breast Exam:  Breasts: s/p bilateral mastectomies, no nodularity, no masses noted -Axilla: No axillary adenopathy bilaterally.  GI: Abdomen soft and round; non-tender, non-distended. Bowel sounds normoactive. No hepatosplenomegaly.   GU: Deferred.  Neuro: No focal deficits. Steady gait.  Psych: Mood and affect normal and appropriate for situation.  MSK: No focal spinal tenderness to palpation, full range of motion in bilateral upper extremities Extremities: No edema. Skin: Warm and dry.  LABORATORY DATA:  Appointment on 10/20/2017  Component Date Value Ref Range Status  . Sodium 10/20/2017 141  136 - 145 mmol/L Final  . Potassium 10/20/2017 4.3  3.5 - 5.1 mmol/L Final  . Chloride 10/20/2017 106  98 - 109 mmol/L Final  . CO2 10/20/2017 28  22 - 29 mmol/L Final  . Glucose, Bld 10/20/2017 96  70 - 140 mg/dL Final  . BUN 10/20/2017 18  7 - 26 mg/dL Final  . Creatinine 10/20/2017 0.89  0.60 - 1.10 mg/dL Final  . Calcium 10/20/2017 9.4  8.4 - 10.4 mg/dL Final  . Total Protein 10/20/2017 6.7  6.4 - 8.3 g/dL Final  . Albumin 10/20/2017 4.0  3.5 - 5.0 g/dL Final  . AST 10/20/2017 19  5 - 34 U/L Final  . ALT 10/20/2017 17  0 - 55 U/L Final  . Alkaline Phosphatase 10/20/2017 60  40 - 150 U/L Final  . Total Bilirubin 10/20/2017 0.3  0.2 - 1.2 mg/dL Final  . GFR, Est Non Af Am 10/20/2017 >60  >60 mL/min Final  . GFR, Est AFR Am 10/20/2017 >60  >60 mL/min Final   Comment: (NOTE) The eGFR has been calculated using  the CKD EPI equation. This calculation has not been validated in all clinical situations. eGFR's persistently <60 mL/min signify possible Chronic  Kidney Disease.   Georgiann Hahn gap 10/20/2017 7  3 - 11 Final   Performed at [facility], [place] [address] [place], [place] [address]  . WBC Count 10/20/2017 2.9* 3.9 - 10.3 K/uL Final  . RBC 10/20/2017 3.85  3.70 - 5.45 MIL/uL Final  . Hemoglobin 10/20/2017 12.4  11.6 - 15.9 g/dL Final  . HCT 10/20/2017 36.9  34.8 - 46.6 % Final  . MCV 10/20/2017 95.9  79.5 - 101.0 fL Final  . MCH 10/20/2017 32.2  25.1 - 34.0 pg Final  . MCHC 10/20/2017 33.5  31.5 - 36.0 g/dL Final  . RDW 10/20/2017 14.2  11.2 - 14.5 % Final  . Platelet Count 10/20/2017 152  145 - 400 K/uL Final  . Neutrophils Relative % 10/20/2017 47  % Final  . Neutro Abs 10/20/2017 1.3* 1.5 - 6.5 K/uL Final  . Lymphocytes Relative 10/20/2017 42  % Final  . Lymphs Abs 10/20/2017 1.2  0.9 - 3.3 K/uL Final  . Monocytes Relative 10/20/2017 11  % Final  . Monocytes Absolute 10/20/2017 0.3  0.1 - 0.9 K/uL Final  . Eosinophils Relative 10/20/2017 0  % Final  . Eosinophils Absolute 10/20/2017 0.0  0.0 - 0.5 K/uL Final  . Basophils Relative 10/20/2017 0  % Final  . Basophils Absolute 10/20/2017 0.0  0.0 - 0.1 K/uL Final   Performed at [facility], [place] [address] [place], [place] [address]    DIAGNOSTIC IMAGING:  Most recent mammogram:  N/a s/p bilateral mastectomies    ASSESSMENT AND PLAN:  Ms.. Hursey is a pleasant 72 y.o. female with history of Stage IA right breast invasive ductal carcinoma, ER-/PR-/HER2-, diagnosed in 08/2013, treated with bilateral mastectomies and adjuvant chemotherapy.  She presents to the Survivorship Clinic for surveillance and routine follow-up.   1. History of breast cancer:  Ms. Levert is currently clinically and radiographically without evidence of disease or recurrence of breast cancer.  She will return in one year for labs and routine LTS follow up.  I encouraged her to call me with any questions or concerns before her next visit at the cancer center, and I  would be happy to see her sooner, if needed.    2. Cancer screening:  Due to Ms. Putz's history and her age, she should receive screening for skin cancers, colon cancer. She was encouraged to follow-up with her PCP for appropriate cancer screenings.   3. Health maintenance and wellness promotion: Ms. Gault was encouraged to consume 5-7 servings of fruits and vegetables per day. She was also encouraged to engage in moderate to vigorous exercise for 30 minutes per day most days of the week. She was instructed to limit her alcohol consumption and continue to abstain from tobacco use.    Dispo:  -Return to cancer center in one year for LTS follow up   A total of (30) minutes of face-to-face time was spent with this patient with greater than 50% of that time in counseling and care-coordination.   Gardenia Phlegm, NP Survivorship Program [place] [phone]   Note: PRIMARY CARE PROVIDER Myer Peer, [place] [phone]

## 2017-10-20 NOTE — Telephone Encounter (Signed)
Gave patient AVs and calendar of upcoming may 2020 appointments °

## 2017-10-23 DIAGNOSIS — H401132 Primary open-angle glaucoma, bilateral, moderate stage: Secondary | ICD-10-CM | POA: Diagnosis not present

## 2017-12-22 DIAGNOSIS — H401132 Primary open-angle glaucoma, bilateral, moderate stage: Secondary | ICD-10-CM | POA: Diagnosis not present

## 2017-12-30 ENCOUNTER — Ambulatory Visit (INDEPENDENT_AMBULATORY_CARE_PROVIDER_SITE_OTHER)
Admission: RE | Admit: 2017-12-30 | Discharge: 2017-12-30 | Disposition: A | Discharge status: Home / Self Care | Payer: PPO | Source: Ambulatory Visit | Attending: Internal Medicine | Admitting: Internal Medicine

## 2017-12-30 DIAGNOSIS — R05 Cough: Secondary | ICD-10-CM | POA: Diagnosis not present

## 2017-12-30 DIAGNOSIS — Z5111 Encounter for antineoplastic chemotherapy: Secondary | ICD-10-CM | POA: Diagnosis not present

## 2017-12-30 DIAGNOSIS — C50919 Malignant neoplasm of unspecified site of unspecified female breast: Secondary | ICD-10-CM | POA: Diagnosis not present

## 2017-12-30 DIAGNOSIS — R059 Cough, unspecified: Secondary | ICD-10-CM

## 2018-01-01 ENCOUNTER — Telehealth: Payer: Self-pay | Admitting: Internal Medicine

## 2018-01-01 NOTE — Telephone Encounter (Signed)
Notes recorded by Brand Males, MD on 12/31/2017 at 4:12 PM EDT Please arrange for followup IMPRESSION:  1. Tracheobronchomalacia. 2. No evidence of interstitial lung disease. 3. New tiny 0.3 cm subsolid anterior right upper lobe pulmonary nodule. Additional scattered tiny pulmonary nodules up to 0.3 cm are stable since 06/17/2017 chest CT and probably benign. Suggest follow-up chest CT in 3-6 months. 4. One vessel coronary atherosclerosis

## 2018-01-01 NOTE — Telephone Encounter (Signed)
Spoke with the pt and notified of recs per MR  She verbalized understanding  She is due f/u and appt scheduled to discuss further

## 2018-01-05 ENCOUNTER — Ambulatory Visit: Payer: PPO | Admitting: Internal Medicine

## 2018-01-05 ENCOUNTER — Encounter: Payer: Self-pay | Admitting: Internal Medicine

## 2018-01-05 VITALS — BP 126/84 | HR 72 | Ht 66.0 in | Wt 162.2 lb

## 2018-01-05 DIAGNOSIS — J398 Other specified diseases of upper respiratory tract: Secondary | ICD-10-CM | POA: Diagnosis not present

## 2018-01-05 DIAGNOSIS — J42 Unspecified chronic bronchitis: Secondary | ICD-10-CM | POA: Diagnosis not present

## 2018-01-05 DIAGNOSIS — Z7185 Encounter for immunization safety counseling: Secondary | ICD-10-CM

## 2018-01-05 DIAGNOSIS — R911 Solitary pulmonary nodule: Secondary | ICD-10-CM | POA: Diagnosis not present

## 2018-01-05 DIAGNOSIS — Z7189 Other specified counseling: Secondary | ICD-10-CM

## 2018-01-05 MED ORDER — BUDESONIDE-FORMOTEROL FUMARATE 80-4.5 MCG/ACT IN AERO: 2.0000 | INHALATION_SPRAY | Freq: Two times a day (BID) | RESPIRATORY_TRACT | 11 refills | Status: DC

## 2018-01-05 MED ORDER — BUDESONIDE-FORMOTEROL FUMARATE 80-4.5 MCG/ACT IN AERO: 2.0000 | INHALATION_SPRAY | Freq: Two times a day (BID) | RESPIRATORY_TRACT | 0 refills | Status: DC

## 2018-01-05 NOTE — Patient Instructions (Addendum)
Chronic bronchitis, unspecified chronic bronchitis type ([place]) Tracheobronchomalacia   - winter cough after infectin is probably because of softening of airways called tracheobronchomalacia - recommend symbicort 80/4.5 or pulmicort inhaler 2 puff twice daily thanksigivng through mid march each year - we can see if this strategy works  Nodule of upper lobe of right lung 3 mm new right upper lobe - seeking 2nd opinion from radiologist regarding timing of followup which I think should be none or 12 mnths   Other CT finding - coronary arttery calcification in 1 vessel  - in absence of chest pain and ongoing tennis play without difficulty dobut it means anything  - but you can d/w your PCP Myer Peer, MD  Immunization history Immunization History  Administered Date(s) Administered  . Influenza, High Dose Seasonal PF 03/03/2017  . Influenza,inj,Quad PF,6+ Mos 04/04/2014, 03/04/2015  . Pneumococcal Conjugate-13 05/04/2015    Per our records you have ahd the above This means you should have or ocould have a) the old pneumonia vaccine which is called pneumovax and b) the new shingles vaccines called Shingrix - please talk to PCP Myer Peer, MD\ about it   Followup - feb 2020 or sooner if needed

## 2018-01-05 NOTE — Progress Notes (Signed)
Subjective:     Patient ID: Wendy [name], female   DOB: 03-15-1946, 72 y.o.   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  10/06/2015 Consult for Bronchitis: Patient is here for a consult for bronchitis that started 08/02/2015 that took 6 weeks to resolve.She was treated by [facility] in [place] with 3 prednisone tapers and  3 rounds of antibiotics as well as an  Injection. She was given both Spiriva ,Symbicort and albuterol inhalers in addition to albuterol nebs.She was diagnosed with whooping cough near the end of her illness. CXR done in March was normal.She does have bouts with bronchitis almost every year, but  It has never taken this long to resolve.She is better now, but is here to establish herself as a patient with the practice for the next time this happens. Spirometry done 08/31/2015 was normal.She has no chest pain, fever, orthopnea, hemoptysis Results for AISSATOU, FRONCZAK (MRN [id number]) as of 06/13/2017 11:38  Ref. Range 10/06/2015 16:00  Nitric Oxide Unknown 24   2015 ct chest - medial right breast primary; clear lung fields  OV 06/13/2017  Chief Complaint  Patient presents with  . Follow-up    Pt has had problems with her bronchitis since May 20, 2017 having a productive cough x3 weeks and was told to come here by Dr. Ann Held for a f/u with MR. Pt also has complaints of mild SOB, wheezing. Denies any CP.   72 year old female who has recurrent episodes of acute bronchitis is and currently in the middle of 1 and is here for follow-up.  She is here with her husband.  She tells me that she always has had one episode of winter bronchitis.  Then in 2015 she was diagnosed with stage I but aggressive triple negative breast cancer and is  status post bilateral mastectomy [CT chest at that time showed clear lung parenchyma] followed by chemotherapy.  Currently she is on observation only followed annually by the nurse practitioner at the breast cancer clinic at [facility].  She was last seen by [place] in 2017 May.  At that time nitric oxide test was normal.  She is fairly doing okay up until May 20, 2017 and reviewing her General she developed sore throat and cough.  The following day she saw her primary care physician at [facility] in [place] and was prescribed a cephalosporin and prednisone Nexium and started on trilogy inhaler..  Then on May 29, 2017 due to persistence of cough she went to urgent care and had a chest x-ray which apparently did not show any pneumonia.  She was given albuterol nebulizer and later cephalosporin was switched to Levaquin.  The following day May 22, 2017 she still had violent cough.  Went to the emergency department was given Ladona Ridgel and sent home.  She then followed up with primary care Dr. Nicki Reaper on June 04, 2017 and apparently her lungs sounded awful was given 30 days of prednisone.  Started on Singulair and advised to start azithromycin after completing Levaquin.  She started the azithromycin on January 2019.  At this point in time she still having a lot of wheezing and doing for nebulizer treatments per day.  And she continues to be symptomatic and she is worried.  RSI cough score is documented below.@ 21 and FeNO is 7ppb and normal.  She is also reporting significant sinus drainage and constantly blowing out her nose with tissue.  07/02/2017 2 week return OV: Pt. Presents for follow up: Pt. Presents today for follow up. She had PFT's prior to our visit. She states she is better. The bronchitis has resolved.She was given a Trelegy for the acute phase of her illness, and Singulair was added., as well as prednisone. This resolved her bronchitis. She has stopped the Trelegy and  the Singulair since.She feels the sinusitis has resolved that was noted on the CT sinus.. She states secretions from her nose are scant. What she does have is white to cream colored. She is clearing her throat a lot today. When asked she states she does feel she has post nasal gtt. She denies fever, chest pain, orthopnea or hemoptysis.We discussed the Ct results and the COPD results. FENO was 10 ppb. She has n  ot had to use [employer] or Proventil nebs as often.    Test Results: FENO 07/02/2017>> 10 ppb  PFT's 07/02/2017 FVC 83% predicted FEV1 96% predicted F/F ratio>> 116 DLCO>> 74%  CT Sinus 06/17/2017>> Bubbly opacity in the sphenoid sinus which could reflect acute sinusitis. Mild mucosal thickening in the maxillary alveolar recesses which may be inconsequential  CT Chest 06/17/2017>> No acute cardiopulmonary abnormalities identified. Mild lower lobe peripheral and basilar predominant interstitial reticulation identified and mild bronchiectasis. Findings are nonspecific and may be the sequelae of recurrent inflammation/infection. Chronic interstitial lung disease not excluded. Consider follow-up imaging in 6-12 months with high-resolution CT of the chest to assess for any temporal change in the appearance of the lungs.   OV 01/05/2018  Chief Complaint  Patient presents with  . Follow-up    HRCT performed 7/30;results   followup - recurrent acute bronchitis that happens in the winter:So far all tests have been negative. In January 2019 when she had a CT scan of the chest is some suggestion of possible ILD changes so she had follow-up CT scan of the chest that I personally visualized and there is no ILD in it. 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 She again reiterated the fact thatshe only gets sick in the winter one time and then she has lingering cough for many weeks and  this is the main issue. At this point in time she's asymptomatic. She plays tennis without any chest pain or shortness of breath.  She is a little bit frustrated that we communicated with her brother did CT scan of the chest in 3 months from 3 mm nodule but in the office I did indicate to her that maybe we can wait 1 year.  She also has questions about tracheobronchomalacia which we went over  She also wanted to know about her immunization status. She thought she needs to get the Prevnar but her records indicate that she already had a Prevnar. She is wondering about a pneumonia vaccine that is 2 doses within she wasn't sure if she was referring to the new shingles vaccine   Dr Lorenza [place] Reflux Symptom Index (> 13-15 suggestive of LPR cough) 06/13/17 \  Hoarseness of problem with voice 4  Clearing  Of Throat 2  Excess throat mucus or feeling of post nasal drip 2  Difficulty swallowing food, liquid  or tablets 0  Cough after eating or lying down 3  Breathing difficulties or choking episodes 4  Troublesome or annoying cough 5  Sensation of something sticking in throat or lump in throat 0  Heartburn, chest pain, indigestion, or stomach acid coming up 1  TOTAL 21    IMPRESSION: 1. Tracheobronchomalacia. 2. No evidence of interstitial lung disease. 3. New tiny 0.3 cm subsolid anterior right upper lobe pulmonary nodule. Additional scattered tiny pulmonary nodules up to 0.3 cm are stable since 06/17/2017 chest CT and probably benign. Suggest follow-up chest CT in 3-6 months. 4. One vessel coronary atherosclerosis.  Aortic Atherosclerosis (ICD10-I70.0).   Electronically Signed   By: Ilona Sorrel M.D.   On: 12/31/2017 11:33   has a past medical history of Asthma, Asthmatic bronchitis, Breast cancer ([place]), Family history of anesthesia complication, Pneumonia, and Sinus headache.   reports that she has never smoked. She has never used smokeless tobacco.  Past Surgical History:   Procedure Laterality Date  . BREAST BIOPSY Right 07/2013  . DILATION AND CURETTAGE OF UTERUS     S/P miscarriage  . FINGER FRACTURE SURGERY Left 2010   "middle finger spiral fx; little finger broken; tissue damage"  . MASTECTOMY COMPLETE / SIMPLE  09/07/2013  . MASTECTOMY COMPLETE / SIMPLE W/ SENTINEL NODE BIOPSY Right 09/07/2013  . MASTECTOMY W/ SENTINEL NODE BIOPSY Bilateral 09/07/2013   Procedure: BILATERAL MASTECTOMY WITH RIGHT SENTINEL LYMPH NODE BIOPSY;  Surgeon: [name], MD;  Location: [place];  Service: General;  Laterality: Bilateral;  . PORT-A-CATH REMOVAL Right 04/19/2014   Procedure: REMOVAL PORT-A-CATH;  Surgeon: Fanny Skates, MD;  Location: [place];  Service: General;  Laterality: Right;  . PORTACATH PLACEMENT Right 09/07/2013  . PORTACATH PLACEMENT Right 09/07/2013   Procedure: INSERTION PORT-A-CATH;  Surgeon: [name], MD;  Location: [place];  Service: General;  Laterality: Right;  . TONSILLECTOMY    . TUBAL LIGATION  1977  . WRIST FRACTURE SURGERY Left 2010    No Known Allergies  Immunization History  Administered Date(s) Administered  . Influenza, High Dose Seasonal PF 03/03/2017  . Influenza,inj,Quad PF,6+ Mos 04/04/2014, 03/04/2015  . Pneumococcal Conjugate-13 05/04/2015    Family History  Problem Relation Age of Onset  . Bladder Cancer Sister 26       worked in [employer], around 2nd hand smoke all her life  . Diabetes Sister   . Colon cancer Brother 59       non-smoker  . Diabetes Brother   . Colon cancer Maternal Uncle 14  . Osteoporosis Mother   . Parkinson's disease Mother   . Diabetes Mother   . Rheum arthritis Father   . Diabetes Father   . Breast cancer Paternal Aunt        dx over 28  . Diabetes Maternal Grandmother   . Diabetes Maternal Grandfather   . Uterine cancer Other 50  . Colon cancer Maternal Aunt        dx in her 31s-70s  . Breast cancer Cousin        paternal cousin dx in her 57s-70s     Current  Outpatient Medications:  .  brimonidine-timolol (COMBIGAN) 0.2-0.5 % ophthalmic solution, Place 1 drop into both eyes at bedtime., Disp: , Rfl:    Review of Systems     Objective:   Physical Exam  Constitutional: She is oriented to person, place, and time. She appears well-developed and well-nourished. No distress.  HENT:  Head: Normocephalic  and atraumatic.  Right Ear: External ear normal.  Left Ear: External ear normal.  Mouth/Throat: Oropharynx is clear and moist. No oropharyngeal exudate.  Eyes: Pupils are equal, round, and reactive to light. Conjunctivae and EOM are normal. Right eye exhibits no discharge. Left eye exhibits no discharge. No scleral icterus.  Neck: Normal range of motion. Neck supple. No JVD present. No tracheal deviation present. No thyromegaly present.  Cardiovascular: Normal rate, regular rhythm, normal heart sounds and intact distal pulses. Exam reveals no gallop and no friction rub.  No murmur heard. Pulmonary/Chest: Effort normal and breath sounds normal. No respiratory distress. She has no wheezes. She has no rales. She exhibits no tenderness.  Abdominal: Soft. Bowel sounds are normal. She exhibits no distension and no mass. There is no tenderness. There is no rebound and no guarding.  Musculoskeletal: Normal range of motion. She exhibits no edema or tenderness.  Lymphadenopathy:    She has no cervical adenopathy.  Neurological: She is alert and oriented to person, place, and time. She has normal reflexes. No cranial nerve deficit. She exhibits normal muscle tone. Coordination normal.  Skin: Skin is warm and dry. No rash noted. She is not diaphoretic. No erythema. No pallor.  Psychiatric: She has a normal mood and affect. Her behavior is normal. Judgment and thought content normal.  Vitals reviewed.  Today's Vitals   01/05/18 1024  BP: 126/84  Pulse: 72  SpO2: 99%  Weight: 162 lb 3.2 oz (73.6 kg)  Height: 5\' 6"  (1.676 m)    Estimated body mass index  is 26.18 kg/m as calculated from the following:   Height as of this encounter: 5\' 6"  (1.676 m).   Weight as of this encounter: 162 lb 3.2 oz (73.6 kg).     Assessment:       ICD-10-CM   1. 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 Nodule of upper lobe of right lung R91.1   4. Immunization counseling Z71.89        Plan:     Chronic bronchitis, unspecified chronic bronchitis type ([place]) Tracheobronchomalacia   - winter cough after infectin is probably because of softening of airways called tracheobronchomalacia - recommend symbicort 80/4.5 or pulmicort inhaler 2 puff twice daily thanksigivng through mid march each year - we can see if this strategy works  Nodule of upper lobe of right lung 3 mm new right upper lobe - seeking 2nd opinion from radiologist regarding timing of followup which I think should be none or 12 mnths   Other CT finding - coronary arttery calcification in 1 vessel  - in absence of chest pain and ongoing tennis play without difficulty dobut it means anything  - but you can d/w your PCP Myer Peer, MD  Immunization history Immunization History  Administered Date(s) Administered  . Influenza, High Dose Seasonal PF 03/03/2017  . Influenza,inj,Quad PF,6+ Mos 04/04/2014, 03/04/2015  . Pneumococcal Conjugate-13 05/04/2015    Per our records you have ahd the above This means you should have or ocould have a) the old pneumonia vaccine which is called pneumovax and b) the new shingles vaccines called Shingrix - please talk to PCP Myer Peer, MD\ about it   Followup - feb 2020 or sooner if needed   Dr. Brand Males, M.D., [facility].C.P Pulmonary and Critical Care Medicine Staff Physician, [place] Director - Interstitial Lung Disease  Program  Pulmonary [place] at  [place], [place], [address]  Pager: [phone], If no  answer or between  15:00h - 7:00h: call [phone] Telephone: [phone]

## 2018-01-09 ENCOUNTER — Telehealth: Payer: Self-pay | Admitting: Internal Medicine

## 2018-01-09 DIAGNOSIS — R05 Cough: Secondary | ICD-10-CM

## 2018-01-09 DIAGNOSIS — R059 Cough, unspecified: Secondary | ICD-10-CM

## 2018-01-09 NOTE — Telephone Encounter (Signed)
Spoke with pt, she states she is waiting on MR to call her for 2nd opinion about CT scan and was told to call if she didn't hear anything by Wednesday. MR please advise.   Nodule of upper lobe of right lung 3 mm new right upper lobe - seeking 2nd opinion from radiologist regarding timing of followup which I think should be none or 12 mnths

## 2018-01-09 NOTE — Telephone Encounter (Signed)
Called and spoke with patient regarding results.  Informed the patient of results and recommendations today. Placed order for CT scan in one year. Pt verbalized understanding and denied any questions or concerns at this time.  Nothing further needed.

## 2018-01-09 NOTE — Telephone Encounter (Signed)
I d/w thoracic radiologist Dr Rosario Jacks -> and she said no need for followup CT for 50mm nodule but given prior advise of short term followup from official radiology read- 1 year followup CT chest without contrast would suffice is my  opinino

## 2018-01-12 ENCOUNTER — Other Ambulatory Visit: Payer: Self-pay | Admitting: Internal Medicine

## 2018-01-12 DIAGNOSIS — R05 Cough: Secondary | ICD-10-CM

## 2018-01-12 DIAGNOSIS — R059 Cough, unspecified: Secondary | ICD-10-CM

## 2018-01-13 DIAGNOSIS — Z9012 Acquired absence of left breast and nipple: Secondary | ICD-10-CM | POA: Diagnosis not present

## 2018-01-13 DIAGNOSIS — C50911 Malignant neoplasm of unspecified site of right female breast: Secondary | ICD-10-CM | POA: Diagnosis not present

## 2018-01-13 DIAGNOSIS — H1012 Acute atopic conjunctivitis, left eye: Secondary | ICD-10-CM | POA: Diagnosis not present

## 2018-01-14 DIAGNOSIS — Z9012 Acquired absence of left breast and nipple: Secondary | ICD-10-CM | POA: Diagnosis not present

## 2018-01-14 DIAGNOSIS — C50911 Malignant neoplasm of unspecified site of right female breast: Secondary | ICD-10-CM | POA: Diagnosis not present

## 2018-01-16 DIAGNOSIS — H25811 Combined forms of age-related cataract, right eye: Secondary | ICD-10-CM | POA: Diagnosis not present

## 2018-01-16 DIAGNOSIS — H401134 Primary open-angle glaucoma, bilateral, indeterminate stage: Secondary | ICD-10-CM | POA: Diagnosis not present

## 2018-01-16 DIAGNOSIS — H401114 Primary open-angle glaucoma, right eye, indeterminate stage: Secondary | ICD-10-CM | POA: Diagnosis not present

## 2018-01-16 DIAGNOSIS — H2511 Age-related nuclear cataract, right eye: Secondary | ICD-10-CM | POA: Diagnosis not present

## 2018-01-16 DIAGNOSIS — H409 Unspecified glaucoma: Secondary | ICD-10-CM | POA: Diagnosis not present

## 2018-01-16 DIAGNOSIS — Z01818 Encounter for other preprocedural examination: Secondary | ICD-10-CM | POA: Diagnosis not present

## 2018-02-06 DIAGNOSIS — H401132 Primary open-angle glaucoma, bilateral, moderate stage: Secondary | ICD-10-CM | POA: Diagnosis not present

## 2018-02-12 DIAGNOSIS — C50911 Malignant neoplasm of unspecified site of right female breast: Secondary | ICD-10-CM | POA: Diagnosis not present

## 2018-02-12 DIAGNOSIS — Z9012 Acquired absence of left breast and nipple: Secondary | ICD-10-CM | POA: Diagnosis not present

## 2018-03-25 DIAGNOSIS — Z23 Encounter for immunization: Secondary | ICD-10-CM | POA: Diagnosis not present

## 2018-04-10 DIAGNOSIS — N3000 Acute cystitis without hematuria: Secondary | ICD-10-CM | POA: Diagnosis not present

## 2018-04-10 DIAGNOSIS — Z6824 Body mass index (BMI) 24.0-24.9, adult: Secondary | ICD-10-CM | POA: Diagnosis not present

## 2018-06-09 DIAGNOSIS — H401132 Primary open-angle glaucoma, bilateral, moderate stage: Secondary | ICD-10-CM | POA: Diagnosis not present

## 2018-07-22 DIAGNOSIS — L570 Actinic keratosis: Secondary | ICD-10-CM | POA: Diagnosis not present

## 2018-07-22 DIAGNOSIS — L821 Other seborrheic keratosis: Secondary | ICD-10-CM | POA: Diagnosis not present

## 2018-09-16 IMAGING — CT CT PARANASAL SINUSES LIMITED
1 of 2 series · 6 of 9 positions shown, 8 images · non-contrast
Comparison: None.

CLINICAL DATA: 71-year-old female with nasal congestion. Cough for
1 month. Multiple rounds of antibiotics.

EXAM:
CT PARANASAL SINUS LIMITED WITHOUT CONTRAST
TECHNIQUE: Non-contiguous multidetector CT images of the paranasal sinuses were
obtained in a single plane without contrast.

[Series 4: limited sinus st · axial · 0.23mm/px · z∈[+153,+203]mm · 6 of 8 slices shown, 8 images]
[im 2/8  brain]
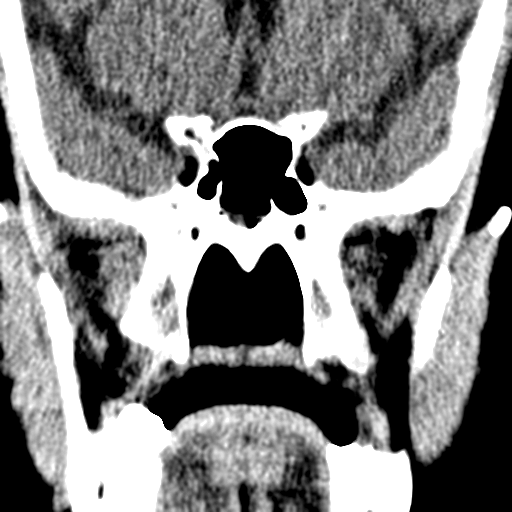
[im 2/8  bone]
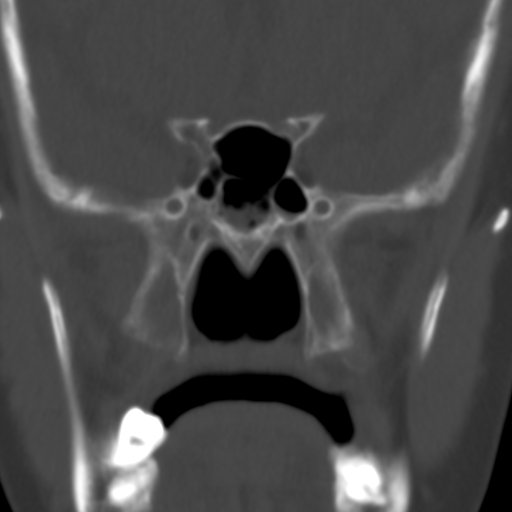
[im 3/8  bone]
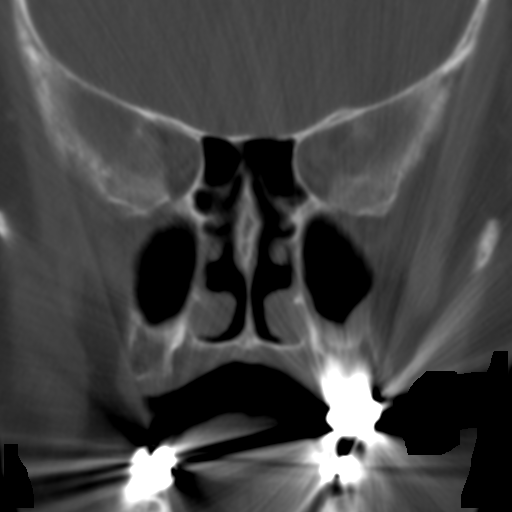
[im 4/8  bone]
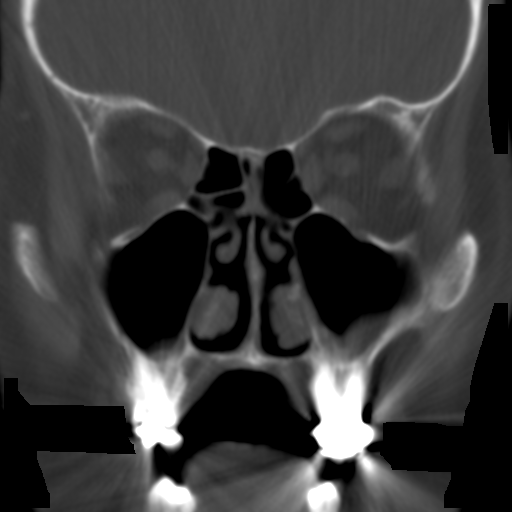
[im 5/8  bone]
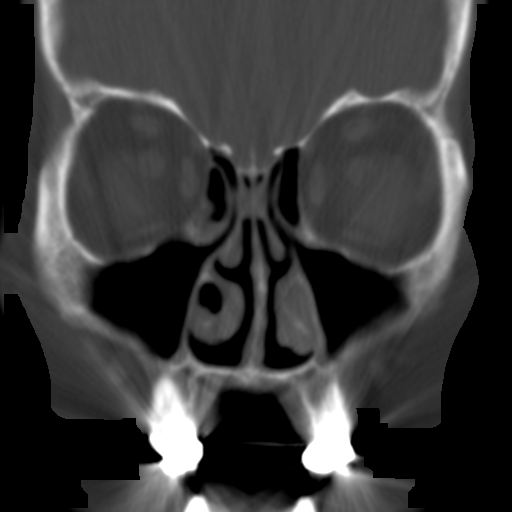
[im 6/8  brain]
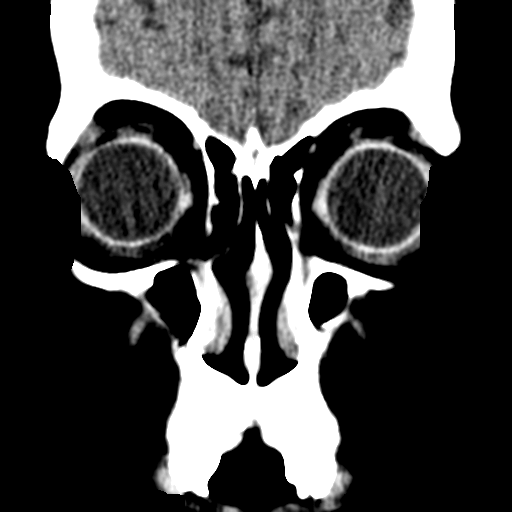
[im 6/8  bone]
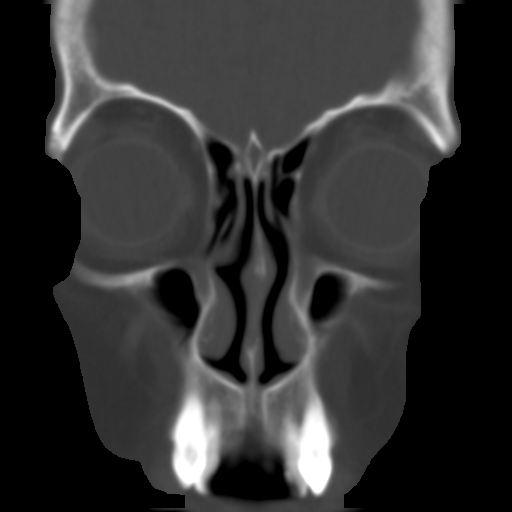
[im 7/8  bone]
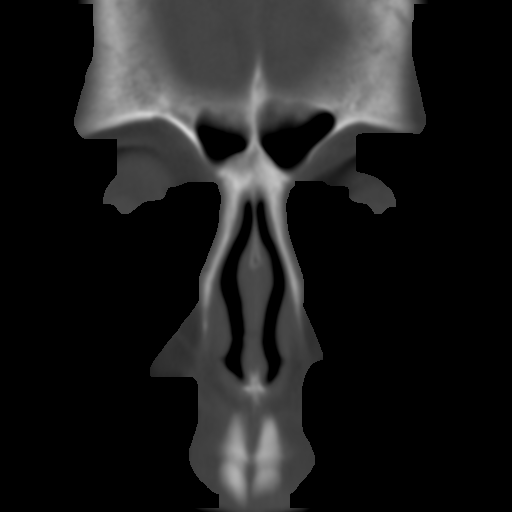

[6 of 9 positions shown; findings below may reference images not displayed]

FINDINGS: Negative visible noncontrast brain parenchyma, orbit soft tissues
and face soft tissues.

Mild mucosal thickening is evident in both maxillary alveolar
recesses. The maxillary sinuses and OMCs otherwise appear patent.

Visible frontal and ethmoid sinuses appear clear.

There is partially visible bubbly opacity and mucosal thickening in
the sphenoid sinus (series 3, image 7).
IMPRESSION: Bubbly opacity in the sphenoid sinus which could reflect acute
sinusitis.

Mild mucosal thickening in the maxillary alveolar recesses which may
be inconsequential.

## 2018-09-16 IMAGING — CT CT CHEST W/O CM
2 of 3 series · 15 of 36 positions shown, 18 images · non-contrast
Comparison: CT chest 08/19/2013

CLINICAL DATA: Dry cough for 1 month.  Bronchitis.

EXAM:
CT CHEST WITHOUT CONTRAST
TECHNIQUE: Multidetector CT imaging of the chest was performed following the
standard protocol without IV contrast.

[Series 2: thorax · axial · 0.66mm/px · z∈[-337,-69]mm · 12 of 158 slices shown, 15 images]
[im 12/158  mediastinal]
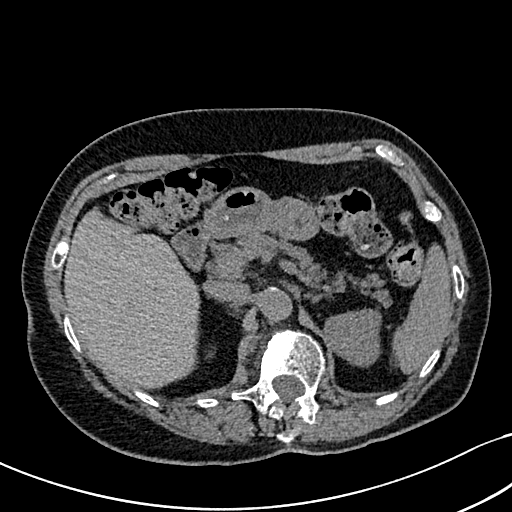
[im 12/158  lung]
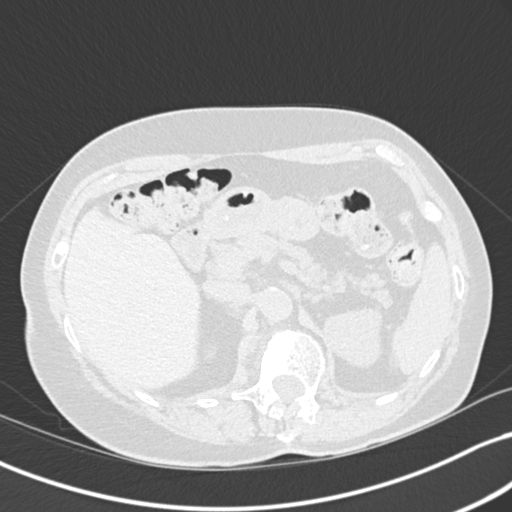
[im 24/158  lung]
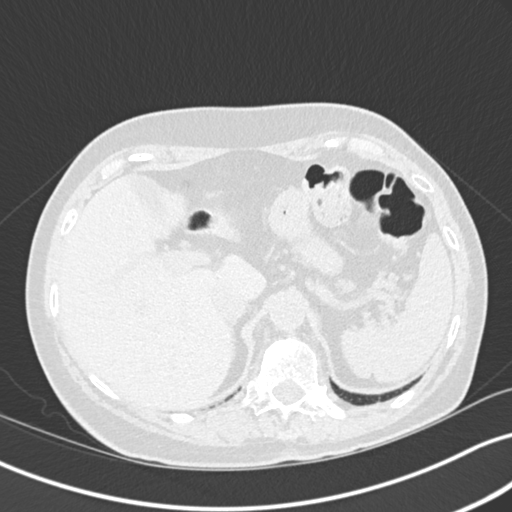
[im 35/158  lung]
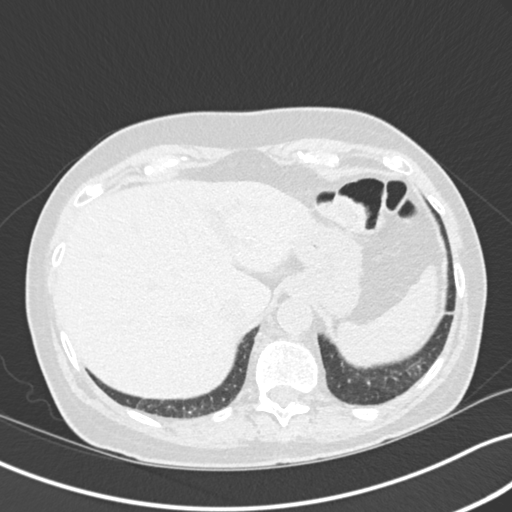
[im 47/158  lung]
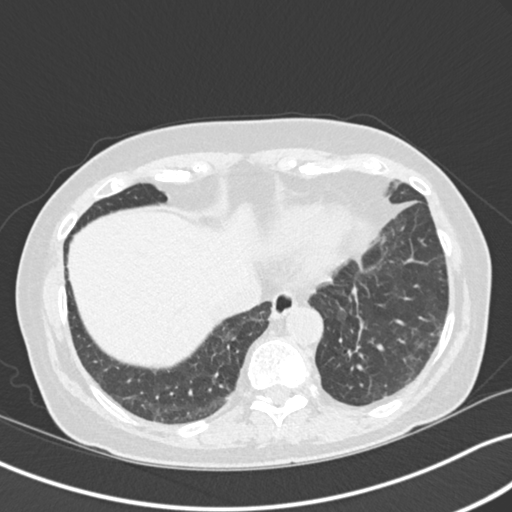
[im 59/158  mediastinal]
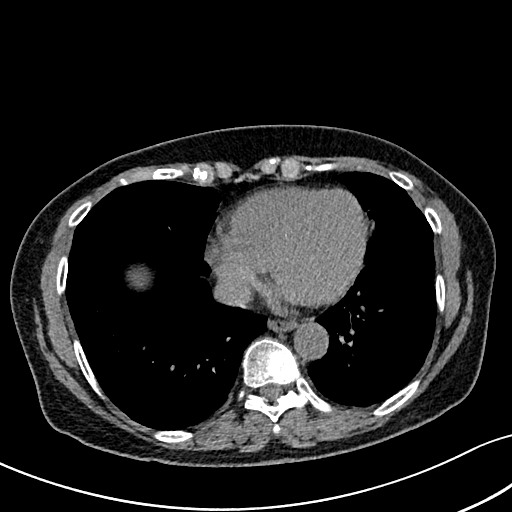
[im 59/158  lung]
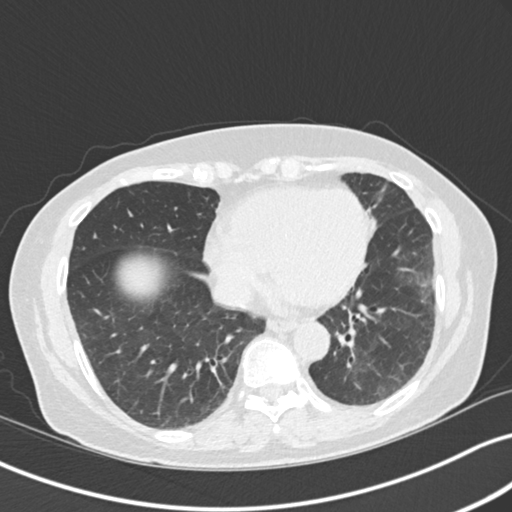
[im 70/158  lung]
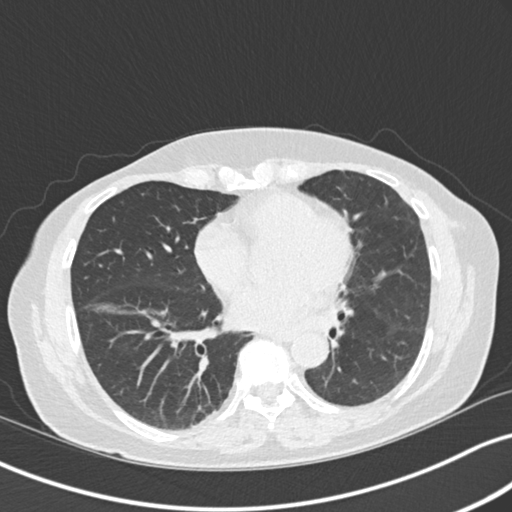
[im 88/158  lung]
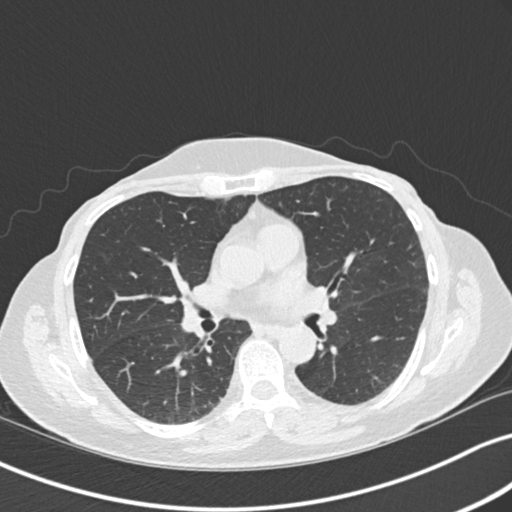
[im 99/158  lung]
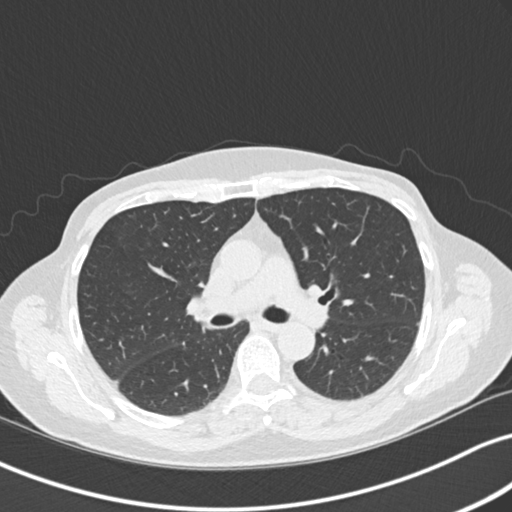
[im 111/158  mediastinal]
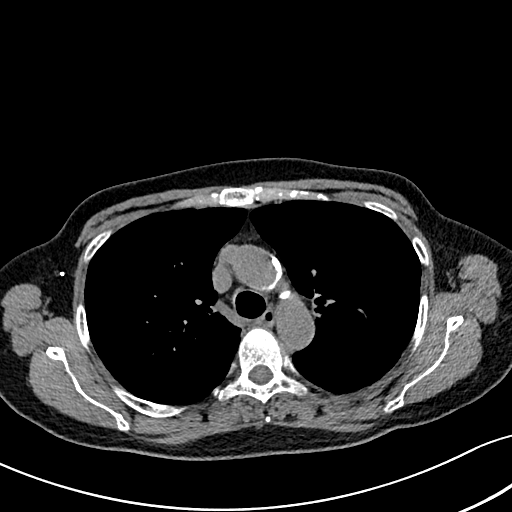
[im 111/158  lung]
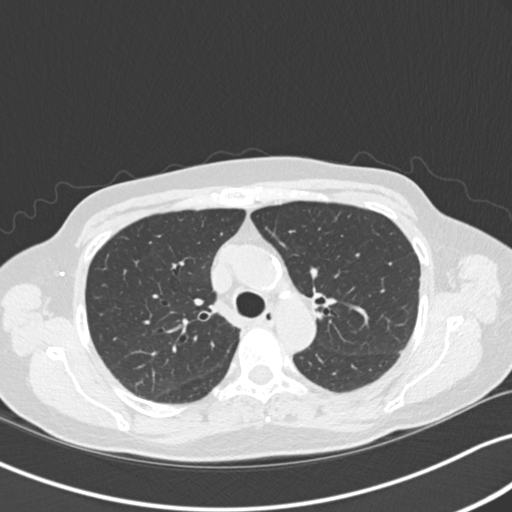
[im 123/158  lung]
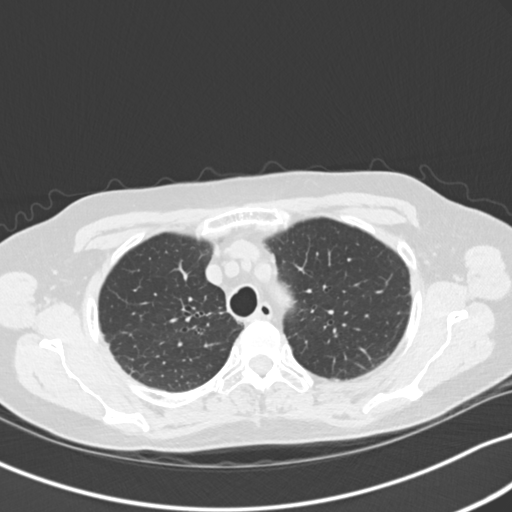
[im 134/158  lung]
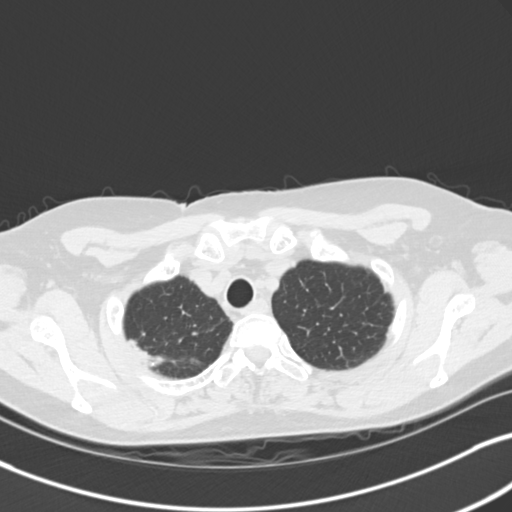
[im 146/158  lung]
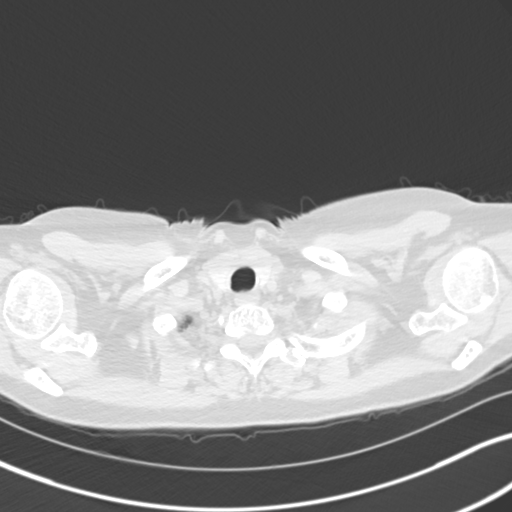

[Series 5: coronal · coronal · 0.62mm/px · 3 of 103 slices shown]
[im 21/103  lung]
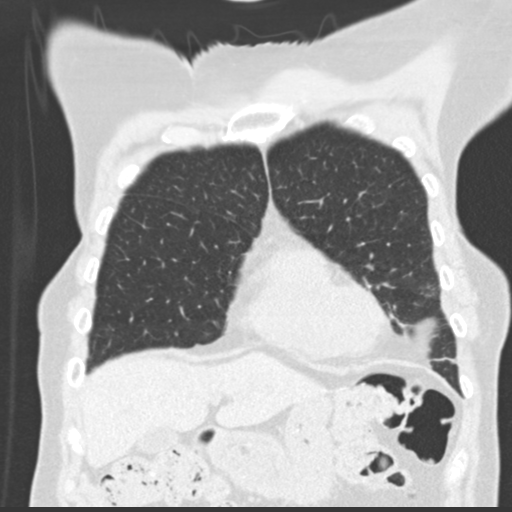
[im 41/103  lung]
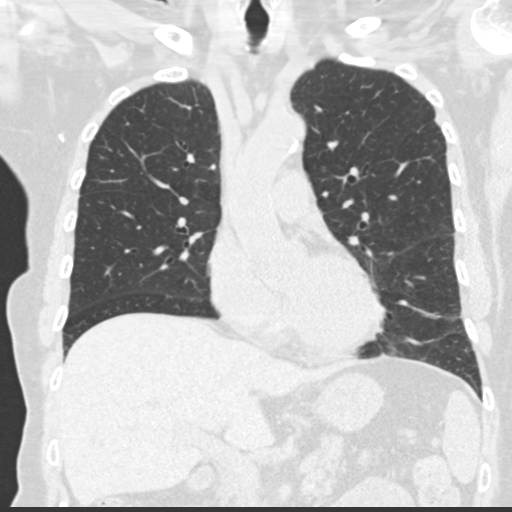
[im 62/103  lung]
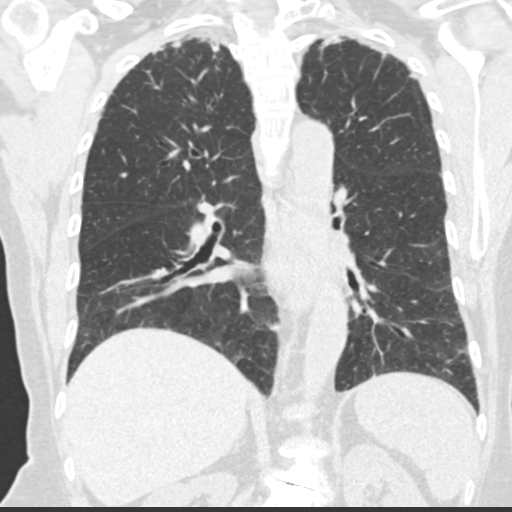

[15 of 36 positions shown; findings below may reference images not displayed]

FINDINGS: Cardiovascular: Normal heart size. Aortic atherosclerosis.
Calcification within the RCA coronary artery identified.

Mediastinum/Nodes: No enlarged mediastinal or axillary lymph nodes.
Thyroid gland, trachea, and esophagus demonstrate no significant
findings.

Lungs/Pleura: Pleuroparenchymal scarring within the a posterolateral
right upper lobe identified, image 24 of series 3. Mild lower lobe
and peripheral predominant interstitial reticulation identified.
There is mild bronchiectasis identified within the posteromedial
right lower lobe. No airspace consolidation identified.

Upper Abdomen: No acute abnormality.

Musculoskeletal: Scoliosis and degenerative disc disease identified.
IMPRESSION: 1. No acute cardiopulmonary abnormalities identified.
2. Mild lower lobe peripheral and basilar predominant interstitial
reticulation identified and mild bronchiectasis. Findings are
nonspecific and may be the sequelae of recurrent
inflammation/infection. Chronic interstitial lung disease not
excluded. Consider follow-up imaging in 6-12 months with
high-resolution CT of the chest to assess for any temporal change in
the appearance of the lungs.
3.  Aortic Atherosclerosis ([id number]).
4. RCA coronary artery calcification.

## 2018-09-18 ENCOUNTER — Telehealth: Payer: Self-pay | Admitting: Adult Health

## 2018-09-18 NOTE — Telephone Encounter (Signed)
Rescheduled LTS appt and changed to [facility] per sch msg. Patient will be contacted

## 2018-10-20 ENCOUNTER — Telehealth: Payer: Self-pay | Admitting: Adult Health

## 2018-10-20 NOTE — Telephone Encounter (Signed)
Called patient's daughter regarding upcoming [facility] appointment, patient will be helped to get set up and e-mail has been sent.

## 2018-10-21 ENCOUNTER — Inpatient Hospital Stay: Payer: Medicare Other | Attending: Adult Health | Admitting: Adult Health

## 2018-10-21 DIAGNOSIS — Z9221 Personal history of antineoplastic chemotherapy: Secondary | ICD-10-CM

## 2018-10-21 DIAGNOSIS — Z9013 Acquired absence of bilateral breasts and nipples: Secondary | ICD-10-CM

## 2018-10-21 DIAGNOSIS — Z79899 Other long term (current) drug therapy: Secondary | ICD-10-CM | POA: Diagnosis not present

## 2018-10-21 DIAGNOSIS — Z171 Estrogen receptor negative status [ER-]: Secondary | ICD-10-CM

## 2018-10-21 DIAGNOSIS — C50211 Malignant neoplasm of upper-inner quadrant of right female breast: Secondary | ICD-10-CM | POA: Diagnosis not present

## 2018-10-21 NOTE — Progress Notes (Signed)
SURVIVORSHIP VIRTUAL VISIT:  I connected with Wendy [name] on 10/21/18 at 10:30 AM EDT by [facility] and verified that I am speaking with the correct person using two identifiers.   I discussed the limitations, risks, security and privacy concerns of performing an evaluation and management service by telephone and the availability of in person appointments. I also discussed with the patient that there may be a patient responsible charge related to this service. The patient expressed understanding and agreed to proceed.     REASON FOR VISIT:  Routine follow-up for history of breast cancer.   BRIEF ONCOLOGIC HISTORY:    Breast cancer of upper-inner quadrant of right female breast ([place])   08/03/2013 Initial Diagnosis    Breast cancer of upper-inner quadrant of right female breast; invasive ductal carcinoma with ductal carcinoma in situ. The tumor was ER negative PR negative HER-2/neu negative with a proliferation marker Ki-67 13% ([place])    09/07/2013 Surgery    Bilateral Mastectomy and SLN biopsy (Dr.Haywood) Left Breast: Benign; Right Breast IDC with DCIS Grade 3 3 SLN neg ER 0%; PR 0%; Her2: ratio 1.4 (Neg)    09/27/2013 - 01/24/2014 Chemotherapy    FEC adjuvant chemo given on day 1 of a 21 day cycle with Neulasta on day 2 for granulocyte support.  A total of 6 cycles were given.      01/31/2014 - 01/31/2014 Chemotherapy    Taxol Carboplatin x 1 cycle.  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     02/08/2014 - 02/15/2014 Chemotherapy    Taxol alone given on 9/8 and 9/14, however discontinued due to increasing grade II peripheral neuropathy.    02/21/2014 - 02/21/2014 Chemotherapy    Gemcitabine weekly given x 1, but discontinued due to neutropenia.       Cancer of right breast [facility])     INTERVAL HISTORY:  Wendy [name] presents to the Survivorship Clinic today for routine follow-up for her history of breast cancer.  Overall, she reports feeling quite well. Wendy [name] notes that she has had no  major health changes this year.  Her biggest concern has been her daughter Berline Chough, who was diagnosed with triple negative breast cancer this year.  She had to undergo chemotherapy and surgery and had a very tough time with her treatment.  She has kept up with seeing her PCP, and notes that her other cancer screenings are up to date.  She denies any concerning symptoms suggestive of recurrence.    Of note, she underwent CT chest in 12/2017 due to a bronchitis that just wouldn't clear.  She notes that she had a small pulmonary nodule.  She saw Dr. Chase Caller, a pulmonologist, who told her that she did not need follow up for said lung nodule.  She is delighted that she did not have bronchitis this year.     REVIEW OF SYSTEMS:  Review of Systems  Constitutional: Negative for appetite change, chills, fatigue, fever and unexpected weight change.  HENT:   Negative for hearing loss, lump/mass, mouth sores and trouble swallowing.   Eyes: Negative for eye problems and icterus.  Respiratory: Negative for chest tightness, cough and shortness of breath.   Cardiovascular: Negative for chest pain, leg swelling and palpitations.  Gastrointestinal: Negative for abdominal distention, abdominal pain, constipation, diarrhea, nausea and vomiting.  Endocrine: Negative for hot flashes.  Genitourinary: Negative for difficulty urinating.   Musculoskeletal: Negative for arthralgias.  Skin: Negative for itching and rash.  Neurological: Negative for dizziness, extremity weakness and numbness.  Hematological: Negative for adenopathy. Does not bruise/bleed easily.  Psychiatric/Behavioral: Negative for depression. The patient is not nervous/anxious.          PAST MEDICAL/SURGICAL HISTORY:  Past Medical History:  Diagnosis Date  . Asthma    as a child  . Asthmatic bronchitis    "about q yr" (09/07/2013)  . Breast cancer ([place])    "right breast"   . Family history of anesthesia complication    "daughter PONV; long  time in recovery" (09/07/2013)  . Pneumonia    "several times; usually follows asthmatic bronchitis" (09/07/2013)  . Sinus headache    "maybe 2-3 times/month"   Past Surgical History:  Procedure Laterality Date  . BREAST BIOPSY Right 07/2013  . DILATION AND CURETTAGE OF UTERUS     S/P miscarriage  . FINGER FRACTURE SURGERY Left 2010   "middle finger spiral fx; little finger broken; tissue damage"  . MASTECTOMY COMPLETE / SIMPLE  09/07/2013  . MASTECTOMY COMPLETE / SIMPLE W/ SENTINEL NODE BIOPSY Right 09/07/2013  . MASTECTOMY W/ SENTINEL NODE BIOPSY Bilateral 09/07/2013   Procedure: BILATERAL MASTECTOMY WITH RIGHT SENTINEL LYMPH NODE BIOPSY;  Surgeon: [name], MD;  Location: [place];  Service: General;  Laterality: Bilateral;  . PORT-A-CATH REMOVAL Right 04/19/2014   Procedure: REMOVAL PORT-A-CATH;  Surgeon: Fanny Skates, MD;  Location: [place];  Service: General;  Laterality: Right;  . PORTACATH PLACEMENT Right 09/07/2013  . PORTACATH PLACEMENT Right 09/07/2013   Procedure: INSERTION PORT-A-CATH;  Surgeon: [name], MD;  Location: [place];  Service: General;  Laterality: Right;  . TONSILLECTOMY    . TUBAL LIGATION  1977  . WRIST FRACTURE SURGERY Left 2010     ALLERGIES:  No Known Allergies   CURRENT MEDICATIONS:  Outpatient Encounter Medications as of 10/21/2018  Medication Sig  . budesonide-formoterol (SYMBICORT) 80-4.5 MCG/ACT inhaler Inhale 2 puffs into the lungs 2 (two) times daily.  . Netarsudil-Latanoprost (ROCKLATAN) 0.02-0.005 % SOLN Apply 1 drop to eye daily.   No facility-administered encounter medications on file as of 10/21/2018.      ONCOLOGIC FAMILY HISTORY:  Family History  Problem Relation Age of Onset  . Bladder Cancer Sister 59       worked in [employer], around 2nd hand smoke all her life  . Diabetes Sister   . Colon cancer Brother 54       non-smoker  . Diabetes Brother   . Colon cancer Maternal Uncle 22  . Osteoporosis Mother   .  Parkinson's disease Mother   . Diabetes Mother   . Rheum arthritis Father   . Diabetes Father   . Breast cancer Paternal Aunt        dx over 59  . Diabetes Maternal Grandmother   . Diabetes Maternal Grandfather   . Uterine cancer Other 50  . Colon cancer Maternal Aunt        dx in her 64s-70s  . Breast cancer Cousin        paternal cousin dx in her 68s-70s    GENETIC COUNSELING/TESTING: See above, negative  SOCIAL HISTORY:  Social History   Socioeconomic History  . Marital status: Married    Spouse name: Not on file  . Number of children: 2  . Years of education: Not on file  . Highest education level: Not on file  Occupational History  . Occupation: retired  Scientific laboratory technician  . Financial resource strain: Not on file  . Food insecurity:    Worry: Not  on file    Inability: Not on file  . Transportation needs:    Medical: Not on file    Non-medical: Not on file  Tobacco Use  . Smoking status: Never Smoker  . Smokeless tobacco: Never Used  Substance and Sexual Activity  . Alcohol use: No    Alcohol/week: 0.0 standard drinks  . Drug use: No  . Sexual activity: Never  Lifestyle  . Physical activity:    Days per week: Not on file    Minutes per session: Not on file  . Stress: Not on file  Relationships  . Social connections:    Talks on phone: Not on file    Gets together: Not on file    Attends religious service: Not on file    Active member of club or organization: Not on file    Attends meetings of clubs or organizations: Not on file    Relationship status: Not on file  . Intimate partner violence:    Fear of current or ex partner: Not on file    Emotionally abused: Not on file    Physically abused: Not on file    Forced sexual activity: Not on file  Other Topics Concern  . Not on file  Social History Narrative  . Not on file      OBJECTIVE:   Wendy [name] appears well.  She is in no apparent distress.  Her daughter is sitting with her.  Wendy [name] has no rash or  lesion.  Her breathing is non labored.  Her mood and behavior are normal.    LABORATORY DATA:  None for this visit   DIAGNOSTIC IMAGING:  Most recent mammogram:  N/a s/p bilateral mastectomies    ASSESSMENT AND PLAN:  Wendy [name] is a pleasant 73 y.o. female with history of Stage IA right breast invasive ductal carcinoma, ER-/PR-/HER2-, diagnosed in 08/2013, treated with bilateral mastectomies and adjuvant chemotherapy.  She presents to the Survivorship Clinic for surveillance and routine follow-up.   1. History of breast cancer:  Wendy [name] is currently clinically and radiographically without evidence of disease or recurrence of breast cancer.   She will return in one year follow up with [place].  We discussed that at this point if she wants to see her PCP for her breast cancer surveillance, that is of course as well.  I encouraged her to call me with any questions or concerns before her next visit at the cancer center, and I would be happy to see her sooner, if needed.    2. Bone health:  Given Wendy [name]'s age, history of breast cancer. At this point, I will defer to her PCP regarding future bone density testing and measurement.  She was given education on specific food and activities to promote bone health.  3. Cancer screening:  Due to Wendy [name]'s history and her age, she should receive screening for skin cancers, colon cancer. She was encouraged to follow-up with her PCP for appropriate cancer screenings.   4. Health maintenance and wellness promotion: Wendy [name] was encouraged to consume 5-7 servings of fruits and vegetables per day. She was also encouraged to engage in moderate to vigorous exercise for 30 minutes per day most days of the week. She was instructed to limit her alcohol consumption and continue to abstain from tobacco use.      Follow up instructions:    -Return to cancer center in one year for LTS follow up    The patient was provided an opportunity  to ask questions and  all were answered. The patient agreed with the plan and demonstrated an understanding of the instructions.   The patient was advised to call back or seek an in-person evaluation if the symptoms worsen or if the condition fails to improve as anticipated.   I provided 25 minutes of face-to-face video visit time during this encounter, and > 50% was spent counseling as documented under my assessment & plan.   Gardenia Phlegm, NP Survivorship Program [place] [phone]   Note: PRIMARY CARE PROVIDER Myer Peer, [place] [phone]

## 2018-10-22 ENCOUNTER — Other Ambulatory Visit: Payer: PPO

## 2018-10-22 ENCOUNTER — Encounter: Payer: PPO | Admitting: Adult Health

## 2018-10-24 ENCOUNTER — Encounter: Payer: Self-pay | Admitting: Adult Health

## 2019-01-06 ENCOUNTER — Other Ambulatory Visit: Payer: Self-pay

## 2019-01-06 ENCOUNTER — Ambulatory Visit (INDEPENDENT_AMBULATORY_CARE_PROVIDER_SITE_OTHER)
Admission: RE | Admit: 2019-01-06 | Discharge: 2019-01-06 | Disposition: A | Discharge status: Home / Self Care | Payer: Medicare Other | Source: Ambulatory Visit | Attending: Internal Medicine | Admitting: Internal Medicine

## 2019-01-06 DIAGNOSIS — R059 Cough, unspecified: Secondary | ICD-10-CM

## 2019-01-06 DIAGNOSIS — R05 Cough: Secondary | ICD-10-CM | POA: Diagnosis not present

## 2019-01-06 DIAGNOSIS — R911 Solitary pulmonary nodule: Secondary | ICD-10-CM | POA: Diagnosis not present

## 2019-01-07 ENCOUNTER — Other Ambulatory Visit: Payer: Medicare Other

## 2019-01-08 DIAGNOSIS — H401132 Primary open-angle glaucoma, bilateral, moderate stage: Secondary | ICD-10-CM | POA: Diagnosis not present

## 2019-01-14 ENCOUNTER — Other Ambulatory Visit: Payer: Self-pay

## 2019-01-14 ENCOUNTER — Telehealth: Payer: Self-pay | Admitting: Internal Medicine

## 2019-01-14 ENCOUNTER — Ambulatory Visit (INDEPENDENT_AMBULATORY_CARE_PROVIDER_SITE_OTHER): Payer: Medicare Other | Admitting: Pulmonary Disease

## 2019-01-14 ENCOUNTER — Encounter: Payer: Self-pay | Admitting: Pulmonary Disease

## 2019-01-14 DIAGNOSIS — J42 Unspecified chronic bronchitis: Secondary | ICD-10-CM | POA: Diagnosis not present

## 2019-01-14 DIAGNOSIS — R918 Other nonspecific abnormal finding of lung field: Secondary | ICD-10-CM | POA: Diagnosis not present

## 2019-01-14 NOTE — Telephone Encounter (Signed)
Called and spoke with pt letting her know that we needed to schedule a visit to further have CT results discussed due to her being overdue for an appt. Pt verbalized understanding. Appt has been scheduled with Aaron Edelman at [place] today. Nothing further needed.

## 2019-01-14 NOTE — Telephone Encounter (Signed)
Pt requesting results of CT performed on 01/06/19. Called pt and made aware we would call back with results once CT reviewed by provider.

## 2019-01-14 NOTE — Progress Notes (Signed)
Virtual Visit via Telephone Note  I connected with [name] on 01/14/19 at  4:00 PM EDT by telephone and verified that I am speaking with the correct person using two identifiers.  Location: Patient: Home Provider: Office [facility] - [address], [place], [place], [place] [address]   I discussed the limitations, risks, security and privacy concerns of performing an evaluation and management service by telephone and the availability of in person appointments. I also discussed with the patient that there may be a patient responsible charge related to this service. The patient expressed understanding and agreed to proceed.  Patient consented to consult via telephone: Yes People present and their role in pt care: Pt     History of Present Illness:  73 year old female never smoker followed in our office for chronic bronchitis as well as a history of pulmonary nodules.  Past medical history: History of breast cancer Smoking history: Never smoker Maintenance: Symbicort 80 Patient of Dr. Chase Caller  Chief complaint: CT results   73 year old female never smoker completing a tele-visit with our office today to discuss CT results from August/2020.  CT results are listed below:  01/06/2019-CT chest without contrast-resolution of previously characterized 3 mm nodule in the anterior right upper lobe, subpleural nodule right middle lobe is calcified consistent with a granuloma, no suspicious pulmonary nodules or masses  Overall, patient reports that she is been doing quite well.  She did not have any episodes of bronchitis since last being seen in 2019 by Dr. Chase Caller.  She is no longer using her Symbicort 80 inhaler regularly.  She reports she uses it as needed if she feels that she is having symptoms.   Observations/Objective:  12/31/2017-CT chest high-res-tracheobronchial malacia, no evidence of ILD, new tiny 3 mm sub-solid anterior right upper lobe pulmonary nodule,  additional scattered pulmonary nodules up to 3 mm are stable since 2019 and probably benign  01/06/2019-CT chest without contrast-resolution of previously characterized 3 mm nodule in the anterior right upper lobe, subpleural nodule right middle lobe is calcified consistent with a granuloma, no suspicious pulmonary nodules or masses  07/02/2017-pulmonary function test- FVC 2.59 (83% predicted), ratio 87,  FEV1 2.26 (96% predicted), DLCO 20.07 (74% predicted)  09/03/2013-echocardiogram-LV ejection fraction 55 to 60%  Assessment and Plan:  Abnormal findings on diagnostic imaging of lung Plan: No need to repeat CT imaging at this time Continue to monitor clinically   Chronic bronchitis (HCC) Plan: Continue Symbicort 80 as needed Follow-up in 6 months   Follow Up Instructions:  Return in about 6 months (around 07/17/2019), or if symptoms worsen or fail to improve, for Follow up with Dr. Purnell Shoemaker.   I discussed the assessment and treatment plan with the patient. The patient was provided an opportunity to ask questions and all were answered. The patient agreed with the plan and demonstrated an understanding of the instructions.   The patient was advised to call back or seek an in-person evaluation if the symptoms worsen or if the condition fails to improve as anticipated.  I provided 18 minutes of non-face-to-face time during this encounter.   Lauraine Rinne, NP

## 2019-01-14 NOTE — Assessment & Plan Note (Signed)
Plan: Continue Symbicort 80 as needed Follow-up in 6 months

## 2019-01-14 NOTE — Assessment & Plan Note (Signed)
Plan: No need to repeat CT imaging at this time Continue to monitor clinically

## 2019-01-14 NOTE — Telephone Encounter (Signed)
According to chart patient has not been seen in >1 year .  Last office note rec follow up in 6 months   Please set up office visit with Dr. Chase Caller or APP for follow up and CT results.

## 2019-01-14 NOTE — Patient Instructions (Signed)
You were seen today by Lauraine Rinne, NP  for:   1. Chronic bronchitis, unspecified chronic bronchitis type ([place])  Continue to use as needed: Symbicort 80 >>> 2 puffs in the morning right when you wake up, rinse out your mouth after use, 12 hours later 2 puffs, rinse after use >>> Take this daily, no matter what >>> This is not a rescue inhaler    2. Abnormal findings on diagnostic imaging of lung  August/2020 CT chest without contrast shows resolved pulmonary nodule.  This is great news.  Still showing small 3 mm pulmonary nodule that is a granuloma.   No need for repeat imaging at this time.  We will continue to monitor you clinically.   Follow Up:    Return in about 6 months (around 07/17/2019), or if symptoms worsen or fail to improve, for Follow up with Dr. Purnell Shoemaker.   Please do your part to reduce the spread of COVID-19:      Reduce your risk of any infection  and COVID19 by using the similar precautions used for avoiding the common cold or flu:  Marland Kitchen Wash your hands often with soap and warm water for at least 20 seconds.  If soap and water are not readily available, use an alcohol-based hand sanitizer with at least 60% alcohol.  . If coughing or sneezing, cover your mouth and nose by coughing or sneezing into the elbow areas of your shirt or coat, into a tissue or into your sleeve (not your hands). Langley Gauss A MASK when in public  . Avoid shaking hands with others and consider head nods or verbal greetings only. . Avoid touching your eyes, nose, or mouth with unwashed hands.  . Avoid close contact with people who are sick. . Avoid places or events with large numbers of people in one location, like concerts or sporting events. . If you have some symptoms but not all symptoms, continue to monitor at home and seek medical attention if your symptoms worsen. . If you are having a medical emergency, call 911.   [facility] /  e-Visit: [email or web address]         [facility]: [facility]: [phone]                   [facility]: [phone]     It is flu season:   >>> Best ways to protect herself from the flu: Receive the yearly flu vaccine, practice good hand hygiene washing with soap and also using hand sanitizer when available, eat a nutritious meals, get adequate rest, hydrate appropriately   Please contact the office if your symptoms worsen or you have concerns that you are not improving.   Thank you for choosing [facility] for your healthcare, and for allowing [place] to partner with you on your healthcare journey. I am thankful to be able to provide care to you today.   Wyn Quaker FNP-C

## 2019-02-09 DIAGNOSIS — L219 Seborrheic dermatitis, unspecified: Secondary | ICD-10-CM | POA: Diagnosis not present

## 2019-02-09 DIAGNOSIS — L299 Pruritus, unspecified: Secondary | ICD-10-CM | POA: Diagnosis not present

## 2019-05-06 DIAGNOSIS — Z853 Personal history of malignant neoplasm of breast: Secondary | ICD-10-CM | POA: Diagnosis not present

## 2019-05-06 DIAGNOSIS — M8580 Other specified disorders of bone density and structure, unspecified site: Secondary | ICD-10-CM | POA: Diagnosis not present

## 2019-05-06 DIAGNOSIS — Z0000 Encounter for general adult medical examination without abnormal findings: Secondary | ICD-10-CM | POA: Diagnosis not present

## 2019-05-06 DIAGNOSIS — Z780 Asymptomatic menopausal state: Secondary | ICD-10-CM | POA: Diagnosis not present

## 2019-05-06 DIAGNOSIS — R109 Unspecified abdominal pain: Secondary | ICD-10-CM | POA: Diagnosis not present

## 2019-05-06 DIAGNOSIS — J42 Unspecified chronic bronchitis: Secondary | ICD-10-CM | POA: Diagnosis not present

## 2019-05-07 DIAGNOSIS — Z79899 Other long term (current) drug therapy: Secondary | ICD-10-CM | POA: Diagnosis not present

## 2019-05-07 DIAGNOSIS — E785 Hyperlipidemia, unspecified: Secondary | ICD-10-CM | POA: Diagnosis not present

## 2019-05-07 DIAGNOSIS — R739 Hyperglycemia, unspecified: Secondary | ICD-10-CM | POA: Diagnosis not present

## 2019-05-18 DIAGNOSIS — R1084 Generalized abdominal pain: Secondary | ICD-10-CM | POA: Diagnosis not present

## 2019-05-18 DIAGNOSIS — N281 Cyst of kidney, acquired: Secondary | ICD-10-CM | POA: Diagnosis not present

## 2019-05-24 DIAGNOSIS — R109 Unspecified abdominal pain: Secondary | ICD-10-CM | POA: Diagnosis not present

## 2019-05-24 DIAGNOSIS — N281 Cyst of kidney, acquired: Secondary | ICD-10-CM | POA: Diagnosis not present

## 2019-07-16 DIAGNOSIS — H401132 Primary open-angle glaucoma, bilateral, moderate stage: Secondary | ICD-10-CM | POA: Diagnosis not present

## 2019-07-27 ENCOUNTER — Inpatient Hospital Stay (HOSPITAL_COMMUNITY)
Admission: EM | Admit: 2019-07-27 | Discharge: 2019-07-28 | DRG: 145 | Disposition: A | Discharge status: Home / Self Care | Payer: Medicare Other | Attending: Family Medicine | Admitting: Family Medicine

## 2019-07-27 ENCOUNTER — Other Ambulatory Visit: Payer: Self-pay

## 2019-07-27 ENCOUNTER — Emergency Department (HOSPITAL_COMMUNITY): Payer: Medicare Other

## 2019-07-27 ENCOUNTER — Encounter (HOSPITAL_COMMUNITY): Payer: Self-pay | Admitting: Emergency Medicine

## 2019-07-27 DIAGNOSIS — R918 Other nonspecific abnormal finding of lung field: Secondary | ICD-10-CM | POA: Diagnosis not present

## 2019-07-27 DIAGNOSIS — Z9221 Personal history of antineoplastic chemotherapy: Secondary | ICD-10-CM | POA: Diagnosis not present

## 2019-07-27 DIAGNOSIS — H409 Unspecified glaucoma: Secondary | ICD-10-CM | POA: Diagnosis present

## 2019-07-27 DIAGNOSIS — Z7951 Long term (current) use of inhaled steroids: Secondary | ICD-10-CM

## 2019-07-27 DIAGNOSIS — R519 Headache, unspecified: Secondary | ICD-10-CM | POA: Diagnosis present

## 2019-07-27 DIAGNOSIS — G620 Drug-induced polyneuropathy: Secondary | ICD-10-CM | POA: Diagnosis present

## 2019-07-27 DIAGNOSIS — M2669 Other specified disorders of temporomandibular joint: Secondary | ICD-10-CM | POA: Diagnosis not present

## 2019-07-27 DIAGNOSIS — Z853 Personal history of malignant neoplasm of breast: Secondary | ICD-10-CM | POA: Diagnosis not present

## 2019-07-27 DIAGNOSIS — Z803 Family history of malignant neoplasm of breast: Secondary | ICD-10-CM | POA: Diagnosis not present

## 2019-07-27 DIAGNOSIS — S0269XA Fracture of mandible of other specified site, initial encounter for closed fracture: Secondary | ICD-10-CM | POA: Diagnosis not present

## 2019-07-27 DIAGNOSIS — S02611A Fracture of condylar process of right mandible, initial encounter for closed fracture: Secondary | ICD-10-CM | POA: Diagnosis not present

## 2019-07-27 DIAGNOSIS — Z79899 Other long term (current) drug therapy: Secondary | ICD-10-CM

## 2019-07-27 DIAGNOSIS — M25551 Pain in right hip: Secondary | ICD-10-CM | POA: Diagnosis present

## 2019-07-27 DIAGNOSIS — W010XXA Fall on same level from slipping, tripping and stumbling without subsequent striking against object, initial encounter: Secondary | ICD-10-CM | POA: Diagnosis present

## 2019-07-27 DIAGNOSIS — Z23 Encounter for immunization: Secondary | ICD-10-CM | POA: Diagnosis not present

## 2019-07-27 DIAGNOSIS — S199XXA Unspecified injury of neck, initial encounter: Secondary | ICD-10-CM | POA: Diagnosis not present

## 2019-07-27 DIAGNOSIS — J449 Chronic obstructive pulmonary disease, unspecified: Secondary | ICD-10-CM | POA: Diagnosis not present

## 2019-07-27 DIAGNOSIS — M25559 Pain in unspecified hip: Secondary | ICD-10-CM

## 2019-07-27 DIAGNOSIS — Z9013 Acquired absence of bilateral breasts and nipples: Secondary | ICD-10-CM

## 2019-07-27 DIAGNOSIS — S0181XA Laceration without foreign body of other part of head, initial encounter: Secondary | ICD-10-CM | POA: Diagnosis not present

## 2019-07-27 DIAGNOSIS — Z20822 Contact with and (suspected) exposure to covid-19: Secondary | ICD-10-CM | POA: Diagnosis not present

## 2019-07-27 DIAGNOSIS — W19XXXA Unspecified fall, initial encounter: Secondary | ICD-10-CM

## 2019-07-27 DIAGNOSIS — S0990XA Unspecified injury of head, initial encounter: Secondary | ICD-10-CM | POA: Diagnosis not present

## 2019-07-27 DIAGNOSIS — S02609A Fracture of mandible, unspecified, initial encounter for closed fracture: Secondary | ICD-10-CM | POA: Diagnosis present

## 2019-07-27 DIAGNOSIS — T451X5A Adverse effect of antineoplastic and immunosuppressive drugs, initial encounter: Secondary | ICD-10-CM | POA: Diagnosis present

## 2019-07-27 LAB — CBC
HCT: 37.4 % (ref 36.0–46.0)
Hemoglobin: 12.5 g/dL (ref 12.0–15.0)
MCH: 30.9 pg (ref 26.0–34.0)
MCHC: 33.4 g/dL (ref 30.0–36.0)
MCV: 92.3 fL (ref 80.0–100.0)
Platelets: 136 10*3/uL — ABNORMAL LOW (ref 150–400)
RBC: 4.05 MIL/uL (ref 3.87–5.11)
RDW: 12.3 % (ref 11.5–15.5)
WBC: 4.8 10*3/uL (ref 4.0–10.5)
nRBC: 0 % (ref 0.0–0.2)

## 2019-07-27 LAB — BASIC METABOLIC PANEL
Anion gap: 10 (ref 5–15)
BUN: 15 mg/dL (ref 8–23)
CO2: 25 mmol/L (ref 22–32)
Calcium: 9.3 mg/dL (ref 8.9–10.3)
Chloride: 103 mmol/L (ref 98–111)
Creatinine, Ser: 0.9 mg/dL (ref 0.44–1.00)
GFR calc Af Amer: >60 mL/min (ref >60)
GFR calc non Af Amer: >60 mL/min (ref >60)
Glucose, Bld: 121 mg/dL — ABNORMAL HIGH (ref 70–99)
Potassium: 4 mmol/L (ref 3.5–5.1)
Sodium: 138 mmol/L (ref 135–145)

## 2019-07-27 MED ORDER — TETANUS-DIPHTH-ACELL PERTUSSIS 5-2.5-18.5 LF-MCG/0.5 IM SUSP
0.5000 mL | Freq: Once | INTRAMUSCULAR | Status: AC
  Administered 2019-07-27: 22:00:00 0.5 mL via INTRAMUSCULAR
  Filled 2019-07-27: qty 0.5

## 2019-07-27 MED ORDER — SODIUM CHLORIDE 0.9% FLUSH: 3.0000 mL | Freq: Once | INTRAVENOUS | Status: DC

## 2019-07-27 MED ORDER — MORPHINE SULFATE (PF) 4 MG/ML IV SOLN
4.0000 mg | Freq: Once | INTRAVENOUS | Status: AC
  Administered 2019-07-27: 22:00:00 4 mg via INTRAVENOUS
  Filled 2019-07-27: qty 1

## 2019-07-27 NOTE — ED Provider Notes (Signed)
[name]ERG[facility]Y DEPARTMENT Provider Note   CSN: [id number] Arrival date & time: 07/27/19  1825    History Chief Complaint  Patient presents with  . Fall   Wendy [name] is a 74 y.o. female with past medical history significant for chronic bronchitis who presents for evaluation of fall.  Patient suffered a mechanical fall earlier today. Was seen by her dentist referred her to max facial surgeon. They got an x-ray which showed a possible mandibular fracture.  She does have a small laceration to her chin.  Pain located to right mandible as well as right zygomatic process. She denies any blood thinners. Rates her pain a 7/10. Denies fever, chills, nausea, vomiting, pain to her dentition. Has mild headache. No chest pain, extremity pain, shortening or rotation of legs. She has been ambulatory that difficulty. Patient has pain worse when she goes to open her jaw. Denies additional aggravating or alleviating factors. Unknown last tetanus  History obtained from patient and past medical records.  No interpreter is used.  Last PO intake 12noon  HPI    Past Medical History:  Diagnosis Date  . Asthma    as a child  . Asthmatic bronchitis    "about q yr" (09/07/2013)  . Breast cancer ([place])    "right breast"   . Family history of anesthesia complication    "daughter PONV; long time in recovery" (09/07/2013)  . Pneumonia    "several times; usually follows asthmatic bronchitis" (09/07/2013)  . Sinus headache    "maybe 2-3 times/month"    Patient Active Problem List   Diagnosis Date Noted  . Abnormal findings on diagnostic imaging of lung 01/14/2019  . Chronic bronchitis ([place]) 10/06/2015  . Rash 03/02/2014  . Chemotherapy induced neutropenia ([place]) 02/28/2014  . 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 Cancer of right breast ([place]) 09/07/2013  . Breast cancer of upper-inner quadrant of right female breast ([place]) 08/03/2013    Past Surgical History:    Procedure Laterality Date  . BREAST BIOPSY Right 07/2013  . DILATION AND CURETTAGE OF UTERUS     S/P miscarriage  . FINGER FRACTURE SURGERY Left 2010   "middle finger spiral fx; little finger broken; tissue damage"  . MASTECTOMY COMPLETE / SIMPLE  09/07/2013  . MASTECTOMY COMPLETE / SIMPLE W/ SENTINEL NODE BIOPSY Right 09/07/2013  . MASTECTOMY W/ SENTINEL NODE BIOPSY Bilateral 09/07/2013   Procedure: BILATERAL MASTECTOMY WITH RIGHT SENTINEL LYMPH NODE BIOPSY;  Surgeon: [name], MD;  Location: [facility];  Service: General;  Laterality: Bilateral;  . PORT-A-CATH REMOVAL Right 04/19/2014   Procedure: REMOVAL PORT-A-CATH;  Surgeon: Fanny Skates, MD;  Location: [facility];  Service: General;  Laterality: Right;  . PORTACATH PLACEMENT Right 09/07/2013  . PORTACATH PLACEMENT Right 09/07/2013   Procedure: INSERTION PORT-A-CATH;  Surgeon: [name], MD;  Location: [facility];  Service: General;  Laterality: Right;  . TONSILLECTOMY    . TUBAL LIGATION  1977  . WRIST FRACTURE SURGERY Left 2010     OB History   No obstetric history on file.     Family History  Problem Relation Age of Onset  . Bladder Cancer Sister 35       worked in [employer], around 2nd hand smoke all her life  . Diabetes Sister   . Colon cancer Brother 2       non-smoker  . Diabetes Brother   . Colon cancer Maternal Uncle 53  . Osteoporosis Mother   .  Parkinson's disease Mother   . Diabetes Mother   . Rheum arthritis Father   . Diabetes Father   . Breast cancer Paternal Aunt        dx over 74  . Diabetes Maternal Grandmother   . Diabetes Maternal Grandfather   . Uterine cancer Other 50  . Colon cancer Maternal Aunt        dx in her 9s-70s  . Breast cancer Cousin        paternal cousin dx in her 73s-70s    Social History   Tobacco Use  . Smoking status: Never Smoker  . Smokeless tobacco: Never Used  Substance Use Topics  . Alcohol use: No    Alcohol/week: 0.0 standard drinks  . Drug use:  No    Home Medications Prior to Admission medications   Medication Sig Start Date End Date Taking? Authorizing Provider  CALCIUM PO Take 1 tablet by mouth in the morning and at bedtime.   Yes [provider]  Netarsudil-Latanoprost (ROCKLATAN) 0.02-0.005 % SOLN Apply 1 drop to eye daily.   Yes [provider]  budesonide-formoterol (SYMBICORT) 80-4.5 MCG/ACT inhaler Inhale 2 puffs into the lungs 2 (two) times daily. Patient not taking: Reported on 07/27/2019 01/05/18   Brand Males, MD    Allergies    Patient has no known allergies.  Review of Systems   Review of Systems  Constitutional: Negative.   HENT: Positive for facial swelling and sinus pain.   Respiratory: Negative.   Cardiovascular: Negative.   Gastrointestinal: Negative.   Genitourinary: Negative.   Musculoskeletal: Negative.   Skin: Negative.   Neurological: Positive for headaches. Negative for dizziness, seizures and light-headedness.  All other systems reviewed and are negative.   Physical Exam Updated Vital Signs BP 136/65   Pulse 77   Temp 98.2 F (36.8 C) (Oral)   Resp 16   SpO2 100%   Physical Exam Vitals and nursing note reviewed.  Constitutional:      General: She is not in acute distress.    Appearance: She is well-developed. She is not ill-appearing, toxic-appearing or diaphoretic.  HENT:     Head: Normocephalic. No raccoon eyes, Battle's sign, right periorbital erythema or left periorbital erythema.     Jaw: Tenderness, swelling and pain on movement present. No trismus.      Comments: Tenderness palpation at right mandible and maxilla as well as right zygomatic process.  No crepitus or step-offs.  Pain worse with opening mouth.  She has no trismus.  There is some mild swelling to the right side of her face    Right Ear: No hemotympanum.     Left Ear: No hemotympanum.     Nose: Nose normal.     Mouth/Throat:     Lips: Pink.     Mouth: Mucous membranes are dry.      Comments: No evidence of intraoral lesions.  Dentition intact. Eyes:     Extraocular Movements: Extraocular movements intact.     Pupils: Pupils are equal, round, and reactive to light.     Comments: EOMs intact.  Neck:     Trachea: Trachea and phonation normal.     Comments: No neck stiffness or neck rigidity.  She does have some very minimal right paraspinal tenderness palpation. Cardiovascular:     Rate and Rhythm: Normal rate.     Heart sounds: Normal heart sounds.  Pulmonary:     Effort: No respiratory distress.     Breath sounds: Normal breath  sounds and air entry.  Chest:     Comments: No overlying skin changes to chest wall, nontender to palpation without any crepitus Abdominal:     General: There is no distension.     Tenderness: There is no abdominal tenderness.     Comments: Soft, nontender without rebound or guarding  Musculoskeletal:        General: Normal range of motion.     Cervical back: Full passive range of motion without pain and normal range of motion.     Comments: Moves all 4 extremities without difficulty.  No bony tenderness to midline spine, bilateral upper and lower extremities  Skin:    General: Skin is warm and dry.     Comments: 52mm laceration to inferior chin.  Nonbleeding or draining.  Neurological:     General: No focal deficit present.     Mental Status: She is alert.     Sensory: Sensation is intact.     Motor: Motor function is intact.     Gait: Gait is intact.     Comments: Cranial nerves II through XII grossly intact.  She is ambulatory without difficulty.    ED Results / Procedures / Treatments   Labs (all labs ordered are listed, but only abnormal results are displayed) Labs Reviewed  BASIC METABOLIC PANEL - Abnormal; Notable for the following components:      Result Value   Glucose, Bld 121 (*)    All other components within normal limits  CBC - Abnormal; Notable for the following components:   Platelets 136 (*)    All other  components within normal limits  RESPIRATORY PANEL BY RT PCR (FLU A&B, COVID)  URINALYSIS, ROUTINE W REFLEX MICROSCOPIC  CBG MONITORING, ED    EKG None  Radiology CT Head Wo Contrast  Result Date: 07/27/2019 CLINICAL DATA:  Fall, jaw pain EXAM: CT HEAD WITHOUT CONTRAST TECHNIQUE: Contiguous axial images were obtained from the base of the skull through the vertex without intravenous contrast. COMPARISON:  None. FINDINGS: Brain: No evidence of acute territorial infarction, hemorrhage, hydrocephalus,extra-axial collection or mass lesion/mass effect. There is dilatation the ventricles and sulci consistent with age-related atrophy. Low-attenuation changes in the deep white matter consistent with small vessel ischemia. Vascular: No hyperdense vessel or unexpected calcification. Skull: The skull is intact. No fracture or focal lesion identified. Sinuses/Orbits: The visualized paranasal sinuses and mastoid air cells are clear. The orbits and globes intact. Other: None Face: Osseous: There is a comminuted impacted fracture seen of the right mandibular head. There is lateral subluxation of a portion of the mandibular head and the ramus. A portion of the mandibular head still articulates with the temporomandibular joint. No other fractures are identified. Orbits: No fracture identified. Unremarkable appearance of globes and orbits. Sinuses: The visualized paranasal sinuses and mastoid air cells are unremarkable. Soft tissues: Soft tissue swelling seen over the right lower jaw. Limited intracranial: No acute findings. Cervical spine: Alignment: There is straightening of the normal cervical lordosis. Skull base and vertebrae: Visualized skull base is intact. No atlanto-occipital dissociation. The vertebral body heights are well maintained. No fracture or pathologic osseous lesion seen. Soft tissues and spinal canal: The visualized paraspinal soft tissues are unremarkable. No prevertebral soft tissue swelling is seen.  The spinal canal is grossly unremarkable, no large epidural collection or significant canal narrowing. Disc levels: Mild disc osteophyte complex and uncovertebral osteophytes is most notable at C5-C6. Upper chest: Biapical scarring is noted. Thoracic inlet is within normal limits. Other:  None IMPRESSION: No acute intracranial abnormality. Findings consistent with age related atrophy and chronic small vessel ischemia Comminuted mildly displaced right mandibular head fracture involving the TMJ. No acute fracture or malalignment of the spine. Electronically Signed   By: Prudencio Pair M.D.   On: 07/27/2019 22:50   CT Cervical Spine Wo Contrast  Result Date: 07/27/2019 CLINICAL DATA:  Fall, jaw pain EXAM: CT HEAD WITHOUT CONTRAST TECHNIQUE: Contiguous axial images were obtained from the base of the skull through the vertex without intravenous contrast. COMPARISON:  None. FINDINGS: Brain: No evidence of acute territorial infarction, hemorrhage, hydrocephalus,extra-axial collection or mass lesion/mass effect. There is dilatation the ventricles and sulci consistent with age-related atrophy. Low-attenuation changes in the deep white matter consistent with small vessel ischemia. Vascular: No hyperdense vessel or unexpected calcification. Skull: The skull is intact. No fracture or focal lesion identified. Sinuses/Orbits: The visualized paranasal sinuses and mastoid air cells are clear. The orbits and globes intact. Other: None Face: Osseous: There is a comminuted impacted fracture seen of the right mandibular head. There is lateral subluxation of a portion of the mandibular head and the ramus. A portion of the mandibular head still articulates with the temporomandibular joint. No other fractures are identified. Orbits: No fracture identified. Unremarkable appearance of globes and orbits. Sinuses: The visualized paranasal sinuses and mastoid air cells are unremarkable. Soft tissues: Soft tissue swelling seen over the right  lower jaw. Limited intracranial: No acute findings. Cervical spine: Alignment: There is straightening of the normal cervical lordosis. Skull base and vertebrae: Visualized skull base is intact. No atlanto-occipital dissociation. The vertebral body heights are well maintained. No fracture or pathologic osseous lesion seen. Soft tissues and spinal canal: The visualized paraspinal soft tissues are unremarkable. No prevertebral soft tissue swelling is seen. The spinal canal is grossly unremarkable, no large epidural collection or significant canal narrowing. Disc levels: Mild disc osteophyte complex and uncovertebral osteophytes is most notable at C5-C6. Upper chest: Biapical scarring is noted. Thoracic inlet is within normal limits. Other: None IMPRESSION: No acute intracranial abnormality. Findings consistent with age related atrophy and chronic small vessel ischemia Comminuted mildly displaced right mandibular head fracture involving the TMJ. No acute fracture or malalignment of the spine. Electronically Signed   By: Prudencio Pair M.D.   On: 07/27/2019 22:50   CT Maxillofacial Wo Contrast  Result Date: 07/27/2019 CLINICAL DATA:  Fall, jaw pain EXAM: CT HEAD WITHOUT CONTRAST TECHNIQUE: Contiguous axial images were obtained from the base of the skull through the vertex without intravenous contrast. COMPARISON:  None. FINDINGS: Brain: No evidence of acute territorial infarction, hemorrhage, hydrocephalus,extra-axial collection or mass lesion/mass effect. There is dilatation the ventricles and sulci consistent with age-related atrophy. Low-attenuation changes in the deep white matter consistent with small vessel ischemia. Vascular: No hyperdense vessel or unexpected calcification. Skull: The skull is intact. No fracture or focal lesion identified. Sinuses/Orbits: The visualized paranasal sinuses and mastoid air cells are clear. The orbits and globes intact. Other: None Face: Osseous: There is a comminuted impacted  fracture seen of the right mandibular head. There is lateral subluxation of a portion of the mandibular head and the ramus. A portion of the mandibular head still articulates with the temporomandibular joint. No other fractures are identified. Orbits: No fracture identified. Unremarkable appearance of globes and orbits. Sinuses: The visualized paranasal sinuses and mastoid air cells are unremarkable. Soft tissues: Soft tissue swelling seen over the right lower jaw. Limited intracranial: No acute findings. Cervical spine: Alignment: There is straightening  of the normal cervical lordosis. Skull base and vertebrae: Visualized skull base is intact. No atlanto-occipital dissociation. The vertebral body heights are well maintained. No fracture or pathologic osseous lesion seen. Soft tissues and spinal canal: The visualized paraspinal soft tissues are unremarkable. No prevertebral soft tissue swelling is seen. The spinal canal is grossly unremarkable, no large epidural collection or significant canal narrowing. Disc levels: Mild disc osteophyte complex and uncovertebral osteophytes is most notable at C5-C6. Upper chest: Biapical scarring is noted. Thoracic inlet is within normal limits. Other: None IMPRESSION: No acute intracranial abnormality. Findings consistent with age related atrophy and chronic small vessel ischemia Comminuted mildly displaced right mandibular head fracture involving the TMJ. No acute fracture or malalignment of the spine. Electronically Signed   By: Prudencio Pair M.D.   On: 07/27/2019 22:50    Procedures .Marland KitchenLaceration Repair  Date/Time: 07/27/2019 10:53 PM Performed by: Nettie Elm, PA-C Authorized by: Nettie Elm, PA-C   Consent:    Consent obtained:  Verbal   Consent given by:  Patient   Risks discussed:  Infection, need for additional repair, pain, poor cosmetic result and poor wound healing   Alternatives discussed:  No treatment and delayed treatment Universal  protocol:    Procedure explained and questions answered to patient or proxy's satisfaction: yes     Relevant documents present and verified: yes     Test results available and properly labeled: yes     Imaging studies available: yes     Required blood products, implants, devices, and special equipment available: yes     Site/side marked: yes     Immediately prior to procedure, a time out was called: yes     Patient identity confirmed:  Verbally with patient Anesthesia (see MAR for exact dosages):    Anesthesia method:  None Laceration details:    Location:  Face   Face location:  Chin   Length (cm):  0.6   Depth (mm):  2 Repair type:    Repair type:  Simple Pre-procedure details:    Preparation:  Patient was prepped and draped in usual sterile fashion and imaging obtained to evaluate for foreign bodies Exploration:    Hemostasis achieved with:  Direct pressure   Wound exploration: wound explored through full range of motion and entire depth of wound probed and visualized     Wound extent: no foreign bodies/material noted, no muscle damage noted, no tendon damage noted and no vascular damage noted     Contaminated: no   Treatment:    Area cleansed with:  Betadine   Amount of cleaning:  Standard   Irrigation method:  Pressure wash Skin repair:    Repair method:  Tissue adhesive Approximation:    Approximation:  Close Post-procedure details:    Dressing:  Open (no dressing)   Patient tolerance of procedure:  Tolerated well, no immediate complications   (including critical care time)  Medications Ordered in ED Medications  sodium chloride flush (NS) 0.9 % injection 3 mL (3 mLs Intravenous Not Given 07/27/19 2153)  Tdap (BOOSTRIX) injection 0.5 mL (0.5 mLs Intramuscular Given 07/27/19 2155)  morphine 4 MG/ML injection 4 mg (4 mg Intravenous Given 07/27/19 2155)    ED Course  I have reviewed the triage vital signs and the nursing notes.  Pertinent labs & imaging results that  were available during my care of the patient were reviewed by me and considered in my medical decision making (see chart for details).  74 year old female appears  otherwise well presents for evaluation after mechanical fall.  Patient with tenderness to right maxilla, mandible and over right zygomatic process.  She is neurovascularly intact.  5 to 6 mm laceration which is nonbleeding, nondraining to her right mid inferior chin.  Discussed suturing versus Dermabond.  Patient prefers Dermabond.  She has no evidence of intraoral lacerations or evidence of open fractures.  Seen by dentistry and had a possible mandible fracture.  Sent her for CT scan and possible evaluation for any surgical management.  No anticoagulation.  No preceding symptoms.  We will plan for pain management, CTs and reevaluate.  Labs obtained from triage without any significant findings.  Small chin laceration repaired with Dermabond.  Clinical Course as of Jul 27 2335  Tue Jul 27, 2019  2254 No acute findings  CT Head Wo Contrast [BH]  2311 No acute findings  CT Cervical Spine Wo Contrast [BH]  2311 Comminuted, displaced right mandibular that extends into the right TMJ.  CT Maxillofacial Wo Contrast [BH]    Clinical Course User Index [BH] Guilherme Schwenke A, PA-C   CONSULT with Dr. Conley Simmonds.  He will review imagings.  Patient may need surgery tonight.  Patient to remain n.p.o. until he calls back.  Requesting Covid test.  CONSULT with Dr. Conley Simmonds with request medicine admission for pain control, surgery at 1 PM tomorrow. NPO after midnight.  CONSULT with Resident with [facility] teaching who will evaluate patient for admission.  Patient's daughter Loreli Benzinger updated on plan. [phone]   MDM Rules/Calculators/A&P                       Final Clinical Impression(s) / ED Diagnoses Final diagnoses:  Fall, initial encounter  Closed fracture of mandible of other site, initial encounter [facility])    Rx / DC Orders ED  Discharge Orders    None       Kay Ricciuti A, PA-C 07/27/19 2337    Margette Fast, MD 07/28/19 1429

## 2019-07-27 NOTE — ED Notes (Signed)
Pt's daughter, Eshaal Skillings, [phone], call for updates or to pick pt up if discharged.

## 2019-07-27 NOTE — H&P (Addendum)
[facility] Admission History and Physical Service Pager: [phone]  Patient name: Wendy [name] Medical record number: [id number] Date of birth: 05/16/1946 Age: 74 y.o. Gender: female  Primary Care Provider: Street, [name], MD Consultants: Oral Surgery Code Status: Full (confirmed on admission) Preferred Emergency Contact: daughter Wendy [name], [phone]   Chief Complaint: Jaw pain s/p fall  Assessment and Plan: Wendy [name] is a 74 y.o. female presenting with comminuted, displaced R mandibular head fx involving the TMJ s/p fall. PMH is significant for chronic bronchitis, pulmonary nodules, h/o of breast ca type 1A 2015 and chemotheraphy related neutropenia, glaucoma.  R mandibular head fx s/p fall  R hip pain Patient was at her daughter's house and was walking on the back patio when she tripped over the edge of an outdoor rug that had been blown up by the wind. She reports that she did not see the edge of the rug, and had no prodromal symptoms/weakness/dizziness leading up to the fall. She did not lose consciousness, no change in vision. She has a mild headache and pain in her jaw. Patient walked over a mile earlier this morning and then played tennis for 2 hours prior to the fall. She is in good cardiovascular and musculoskeletal health. She subsequently presented to her dentist and an x-ray revealed a jaw fracture. Her dentist recommended she present to [facility] for evaluation. In the ED, a CT maxillofacial revealed a comminuted, mildly displaced R mandibular head fracture involving the TMJ on the right. CT Head with no acute processes. CT c-spine with no acute fracture or bony abnormality. The oral surgeon has been consulted who recommended include full liquid diet until midnight, NPO after midnight with planned OR procedure tomorrow at 1300. She also noted that she has some soreness in her R hip, pain is lateral over greater trochanter. Patient has no  paraspinal muscle tenderness, full ROM, normal FADIR/FABER, SLR negative. No ecchymosis on skin. No paravertebral tenderness over lumbar spine without bony stepoffs. Overall pain was well controlled with morphine in the ED. Patient reports that her mother had significant osteoporosis, she recently had a DEXA scan and was recommended to start a calcium and Vit D supplement, but no diagnosis of osteoporosis (per patient). Will obtain R hip xrays to r/o fracture. Vital signs are stable. Lab work reveals normal CBC and BMP. Respiratory panel (Flu A&B, Covid) pending.  Patient does not take blood thinners. - Admit to med-surg, FPTS, attending Dr. McDiarmid  - F/U R hip xrays - NPO after midnight - IVF while NPO - Pain control: ibuprofen 600mg  q6h, oxycodone 5-10mg  IR q4 PRN, morphine 2mg  q2h PRN for breakthrough pain - Vitals per floor routine - SCDs for DVT ppx - EKG tomorrow AM  Chronic Bronchitis  H/O Abnormal Lung CT Patient is followed by Dr. Chase Caller at [facility]. Patient has never smoked, has a h/o chronic bronchitis and pulmonary nodules. Pulmonary nodules noted since 2019 CT chest: 25mm anterior RUL nodule, additional scattered pulmonary nodules up to 25mm. Most recent CT in August 2020 showed resolution of nodule in RUL, subpleural nodule RML is calcified and c/w granuloma, no suspicious pulmonary nodules or masses. Routine f/u as outpatient recommended (February/March 2021). January 2019 PFT: FVC 2.59 (83% predicted), ratio 87,  FEV1 2.26 (96% predicted), DLCO 20.07 (74% predicted). Echo in 2015 WNL, LVEF 55-60%. Home medication includes symbicort 80 prn. Hasn't used in a few years.  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 Tumor was triple negative. 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 She received FEC adjuvant chemo w/ neulasta x6 cycles in 2015, as well as taxol x2 cycles (d/c after 2 d/t peripheral neuropathy), carboplatin x 1 cycle (d/c 2/2 peripheral neuropathy), and finally gemcitabine x1 cycle (d/c 2/2 neutropenia). No concern for recurrence, last f/u 10/2018 with annual follow up. Normal CBC on admission.  FEN/GI: NPO after midnight Prophylaxis: SCDs  Disposition: admit to med-surg pending surgical repair of R jaw fracture  History of Present Illness:  Wendy [name] is a 74 y.o. female presenting with fracture after fall this afternoon. She states she tripped over curved rug while walking on daughter's patio. Had hands outstretched but hit chin on stone patio. She denied dizziness or preceding symptoms. She had just finished a tennis game and had her well fitting tennis shoes on. Denies vision changes, loss of consciousness. Also with hip pain after fall, not sure if she injured her hip during the fall.  She called her dentist on the way to the ED who instructed her to come into the office. She was evaluated by a dentist and oral surgeon at that time with xrays suspicious for fracture who recommended presentation to the ED for CT imaging and possible surgeries. She is not on blood thinners. Feels her pain is 4-5/10 currently.   Review Of Systems: Per HPI with the following additions:   Review of Systems  Constitutional: Negative for chills, fever and weight loss.  HENT: Negative for sinus pain and sore throat.        Jaw pain  Eyes: Positive for redness. Negative for blurred vision and double vision.  Respiratory: Negative for cough, shortness of breath and wheezing.   Cardiovascular: Negative for chest pain and leg swelling.  Gastrointestinal: Negative for abdominal pain, diarrhea, nausea and vomiting.  Genitourinary: Negative.   Musculoskeletal: Positive for joint pain. Negative for back pain.  Skin: Negative.    Neurological: Negative for dizziness, tingling, speech change, focal weakness, loss of consciousness, weakness and headaches.  Endo/Heme/Allergies: Does not bruise/bleed easily.  Psychiatric/Behavioral: Negative.     Patient Active Problem List   Diagnosis Date Noted  . Abnormal findings on diagnostic imaging of lung 01/14/2019  . Chronic bronchitis ([place]) 10/06/2015  . Rash 03/02/2014  . 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 Cancer of right breast ([place]) 09/07/2013  . Breast cancer of upper-inner quadrant of right female breast ([place]) 08/03/2013    Past Medical History: Past Medical History:  Diagnosis Date  . Asthma    as a child  . Asthmatic bronchitis    "about q yr" (09/07/2013)  . Breast cancer ([place])    "right breast"   . Family history of anesthesia complication    "daughter PONV; long time in recovery" (09/07/2013)  . Pneumonia    "several times; usually follows asthmatic bronchitis" (09/07/2013)  . Sinus headache    "maybe 2-3 times/month"    Past Surgical History: Past Surgical History:  Procedure Laterality Date  . BREAST BIOPSY Right 07/2013  . DILATION AND CURETTAGE OF UTERUS     S/P miscarriage  . FINGER FRACTURE SURGERY Left 2010   "middle finger spiral fx; little finger broken; tissue damage"  . MASTECTOMY COMPLETE / SIMPLE  09/07/2013  . MASTECTOMY COMPLETE / SIMPLE W/ SENTINEL NODE BIOPSY Right 09/07/2013  . MASTECTOMY W/ SENTINEL NODE BIOPSY Bilateral 09/07/2013   Procedure: BILATERAL MASTECTOMY WITH RIGHT SENTINEL LYMPH NODE BIOPSY;  Surgeon: [name], MD;  Location: [facility];  Service: General;  Laterality: Bilateral;  . PORT-A-CATH REMOVAL Right 04/19/2014   Procedure: REMOVAL PORT-A-CATH;  Surgeon: Fanny Skates, MD;  Location: [facility];  Service: General;  Laterality: Right;  . PORTACATH PLACEMENT Right 09/07/2013  . PORTACATH PLACEMENT Right 09/07/2013    Procedure: INSERTION PORT-A-CATH;  Surgeon: [name], MD;  Location: [facility];  Service: General;  Laterality: Right;  . TONSILLECTOMY    . TUBAL LIGATION  1977  . WRIST FRACTURE SURGERY Left 2010    Social History: Social History   Tobacco Use  . Smoking status: Never Smoker  . Smokeless tobacco: Never Used  Substance Use Topics  . Alcohol use: No    Alcohol/week: 0.0 standard drinks  . Drug use: No   Additional social history: Does not drink alcohol, tobacco or illicit drug use. Please also refer to relevant sections of EMR.  Family History: Family History  Problem Relation Age of Onset  . Bladder Cancer Sister 21       worked in [employer], around 2nd hand smoke all her life  . Diabetes Sister   . Colon cancer Brother 17       non-smoker  . Diabetes Brother   . Colon cancer Maternal Uncle 44  . Osteoporosis Mother   . Parkinson's disease Mother   . Diabetes Mother   . Rheum arthritis Father   . Diabetes Father   . Breast cancer Paternal Aunt        dx over 53  . Diabetes Maternal Grandmother   . Diabetes Maternal Grandfather   . Uterine cancer Other 50  . Colon cancer Maternal Aunt        dx in her 44s-70s  . Breast cancer Cousin        paternal cousin dx in her 90s-70s    Allergies and Medications: No Known Allergies No current facility-administered medications on file prior to encounter.   Current Outpatient Medications on File Prior to Encounter  Medication Sig Dispense Refill  . CALCIUM PO Take 1 tablet by mouth in the morning and at bedtime.    . Netarsudil-Latanoprost (ROCKLATAN) 0.02-0.005 % SOLN Apply 1 drop to eye daily.    . budesonide-formoterol (SYMBICORT) 80-4.5 MCG/ACT inhaler Inhale 2 puffs into the lungs 2 (two) times daily. (Patient not taking: Reported on 07/27/2019) 1 Inhaler 11    Objective: BP 136/65   Pulse 77   Temp 98.2 F (36.8 C) (Oral)   Resp 16   SpO2 100%  Exam: General: older, thin woman resting comfortably in bed,  NAD Eyes: conjunctival injection, PERRLA ENTM: abrasion on R inner cheek, swollen R mandible, clear oropharynx Neck: supple Cardiovascular: RRR, no m/r/g, no LE edema, 2+ pulses bilaterally Respiratory: CTAB, no increased WOB, no rales, rhonchi, wheezing Gastrointestinal: soft, NT, ND, normal BS MSK: tenderness to palpation over R greater trochanter, no ecchymosis present, full ROM, no pain or decrease ROM with internal and external rotation, SLR negative. No tenderness over bilateral paraspinal muscles, sacrum, or central column. Derm: abrasion to chin and knuckles Neuro: 5/5 strength bilaterally in UE and LEs. CN II-XII w/o focal deficits. Psych: normal mood, full affect  Labs and Imaging: CBC BMET  Recent Labs  Lab 07/27/19 1840  WBC 4.8  HGB 12.5  HCT 37.4  PLT 136*   Recent Labs  Lab 07/27/19 1840  NA 138  K 4.0  CL 103  CO2 25  BUN 15  CREATININE 0.90  GLUCOSE 121*  CALCIUM 9.3     EKG: none  CT Head Wo Contrast  Result Date: 07/27/2019 CLINICAL DATA:  Fall, jaw pain EXAM: CT HEAD WITHOUT CONTRAST TECHNIQUE: Contiguous axial images were obtained from the base of the skull through the vertex without intravenous contrast. COMPARISON:  None. FINDINGS: Brain: No evidence of acute territorial infarction, hemorrhage, hydrocephalus,extra-axial collection or mass lesion/mass effect. There is dilatation the ventricles and sulci consistent with age-related atrophy. Low-attenuation changes in the deep white matter consistent with small vessel ischemia. Vascular: No hyperdense vessel or unexpected calcification. Skull: The skull is intact. No fracture or focal lesion identified. Sinuses/Orbits: The visualized paranasal sinuses and mastoid air cells are clear. The orbits and globes intact. Other: None Face: Osseous: There is a comminuted impacted fracture seen of the right mandibular head. There is lateral subluxation of a portion of the mandibular head and the ramus. A portion of the  mandibular head still articulates with the temporomandibular joint. No other fractures are identified. Orbits: No fracture identified. Unremarkable appearance of globes and orbits. Sinuses: The visualized paranasal sinuses and mastoid air cells are unremarkable. Soft tissues: Soft tissue swelling seen over the right lower jaw. Limited intracranial: No acute findings. Cervical spine: Alignment: There is straightening of the normal cervical lordosis. Skull base and vertebrae: Visualized skull base is intact. No atlanto-occipital dissociation. The vertebral body heights are well maintained. No fracture or pathologic osseous lesion seen. Soft tissues and spinal canal: The visualized paraspinal soft tissues are unremarkable. No prevertebral soft tissue swelling is seen. The spinal canal is grossly unremarkable, no large epidural collection or significant canal narrowing. Disc levels: Mild disc osteophyte complex and uncovertebral osteophytes is most notable at C5-C6. Upper chest: Biapical scarring is noted. Thoracic inlet is within normal limits. Other: None IMPRESSION: No acute intracranial abnormality. Findings consistent with age related atrophy and chronic small vessel ischemia Comminuted mildly displaced right mandibular head fracture involving the TMJ. No acute fracture or malalignment of the spine. Electronically Signed   By: Prudencio Pair M.D.   On: 07/27/2019 22:50   CT Cervical Spine Wo Contrast  Result Date: 07/27/2019 CLINICAL DATA:  Fall, jaw pain EXAM: CT HEAD WITHOUT CONTRAST TECHNIQUE: Contiguous axial images were obtained from the base of the skull through the vertex without intravenous contrast. COMPARISON:  None. FINDINGS: Brain: No evidence of acute territorial infarction, hemorrhage, hydrocephalus,extra-axial collection or mass lesion/mass effect. There is dilatation the ventricles and sulci consistent with age-related atrophy. Low-attenuation changes in the deep white matter consistent with small  vessel ischemia. Vascular: No hyperdense vessel or unexpected calcification. Skull: The skull is intact. No fracture or focal lesion identified. Sinuses/Orbits: The visualized paranasal sinuses and mastoid air cells are clear. The orbits and globes intact. Other: None Face: Osseous: There is a comminuted impacted fracture seen of the right mandibular head. There is lateral subluxation of a portion of the mandibular head and the ramus. A portion of the mandibular head still articulates with the temporomandibular joint. No other fractures are identified. Orbits: No fracture identified. Unremarkable appearance of globes and orbits. Sinuses: The visualized paranasal sinuses and mastoid air cells are unremarkable. Soft tissues: Soft tissue swelling seen over the right lower jaw. Limited intracranial: No acute findings. Cervical spine: Alignment: There is straightening of the normal cervical  lordosis. Skull base and vertebrae: Visualized skull base is intact. No atlanto-occipital dissociation. The vertebral body heights are well maintained. No fracture or pathologic osseous lesion seen. Soft tissues and spinal canal: The visualized paraspinal soft tissues are unremarkable. No prevertebral soft tissue swelling is seen. The spinal canal is grossly unremarkable, no large epidural collection or significant canal narrowing. Disc levels: Mild disc osteophyte complex and uncovertebral osteophytes is most notable at C5-C6. Upper chest: Biapical scarring is noted. Thoracic inlet is within normal limits. Other: None IMPRESSION: No acute intracranial abnormality. Findings consistent with age related atrophy and chronic small vessel ischemia Comminuted mildly displaced right mandibular head fracture involving the TMJ. No acute fracture or malalignment of the spine. Electronically Signed   By: Prudencio Pair M.D.   On: 07/27/2019 22:50   CT Maxillofacial Wo Contrast  Result Date: 07/27/2019 CLINICAL DATA:  Fall, jaw pain EXAM: CT  HEAD WITHOUT CONTRAST TECHNIQUE: Contiguous axial images were obtained from the base of the skull through the vertex without intravenous contrast. COMPARISON:  None. FINDINGS: Brain: No evidence of acute territorial infarction, hemorrhage, hydrocephalus,extra-axial collection or mass lesion/mass effect. There is dilatation the ventricles and sulci consistent with age-related atrophy. Low-attenuation changes in the deep white matter consistent with small vessel ischemia. Vascular: No hyperdense vessel or unexpected calcification. Skull: The skull is intact. No fracture or focal lesion identified. Sinuses/Orbits: The visualized paranasal sinuses and mastoid air cells are clear. The orbits and globes intact. Other: None Face: Osseous: There is a comminuted impacted fracture seen of the right mandibular head. There is lateral subluxation of a portion of the mandibular head and the ramus. A portion of the mandibular head still articulates with the temporomandibular joint. No other fractures are identified. Orbits: No fracture identified. Unremarkable appearance of globes and orbits. Sinuses: The visualized paranasal sinuses and mastoid air cells are unremarkable. Soft tissues: Soft tissue swelling seen over the right lower jaw. Limited intracranial: No acute findings. Cervical spine: Alignment: There is straightening of the normal cervical lordosis. Skull base and vertebrae: Visualized skull base is intact. No atlanto-occipital dissociation. The vertebral body heights are well maintained. No fracture or pathologic osseous lesion seen. Soft tissues and spinal canal: The visualized paraspinal soft tissues are unremarkable. No prevertebral soft tissue swelling is seen. The spinal canal is grossly unremarkable, no large epidural collection or significant canal narrowing. Disc levels: Mild disc osteophyte complex and uncovertebral osteophytes is most notable at C5-C6. Upper chest: Biapical scarring is noted. Thoracic inlet is  within normal limits. Other: None IMPRESSION: No acute intracranial abnormality. Findings consistent with age related atrophy and chronic small vessel ischemia Comminuted mildly displaced right mandibular head fracture involving the TMJ. No acute fracture or malalignment of the spine. Electronically Signed   By: Prudencio Pair M.D.   On: 07/27/2019 22:50   Gladys Damme, MD 07/27/2019, 11:26 PM PGY-1, [place] Intern pager: [phone], text pages welcome  FPTS Upper-Level Resident Addendum   I have independently interviewed and examined the patient. I have discussed the above with the original author and agree with their documentation. My edits for correction/addition/clarification are in green. Please see also any attending notes.    Rory Percy, DO PGY-3, [facility] 07/28/2019 1:52 AM  FPTS Service pager: [phone] (text pages welcome through [employer])

## 2019-07-27 NOTE — ED Notes (Signed)
Pt transported to CT ?

## 2019-07-27 NOTE — Consult Note (Signed)
Pt presented to the ED s/p fall from standing earlier today. Per conversaton with PA, stable after analgesic administration. CT scan showing right sided intracapsular condyle fracture and loss of vertical dimension of occlusion. Will plan for closed reduction maxillomandibular fixation in the OR tomorrow.   Recommend full liquid diet, NPO after midnight Ice to face No need to start antibiotics pre-operatively, will give peri-op dosage in the OR

## 2019-07-27 NOTE — ED Notes (Signed)
Daughter Berline Chough called and would like to know where her mother is on the CT list, and if she can have something for pain while waiting

## 2019-07-27 NOTE — ED Triage Notes (Signed)
Pt arrives to ED from her dentist office with complaints of falling this afternoon and hitting the bottom of her chin. Patient states that the x-rays done by her dentist showed a broken jaw bone to the right. Patient stated that she is here for possible surgery. No hx of blood thinners and patient denies headache.

## 2019-07-28 ENCOUNTER — Encounter (HOSPITAL_COMMUNITY): Payer: Self-pay | Admitting: Family Medicine

## 2019-07-28 ENCOUNTER — Inpatient Hospital Stay (HOSPITAL_COMMUNITY): Payer: Medicare Other

## 2019-07-28 ENCOUNTER — Inpatient Hospital Stay (HOSPITAL_COMMUNITY): Payer: Medicare Other | Admitting: Certified Registered"

## 2019-07-28 ENCOUNTER — Encounter (HOSPITAL_COMMUNITY)
Admission: EM | Disposition: A | Discharge status: Home / Self Care | Payer: Self-pay | Source: Home / Self Care | Attending: Family Medicine

## 2019-07-28 DIAGNOSIS — S0181XA Laceration without foreign body of other part of head, initial encounter: Secondary | ICD-10-CM | POA: Diagnosis not present

## 2019-07-28 DIAGNOSIS — S79911A Unspecified injury of right hip, initial encounter: Secondary | ICD-10-CM | POA: Diagnosis not present

## 2019-07-28 DIAGNOSIS — R519 Headache, unspecified: Secondary | ICD-10-CM | POA: Diagnosis not present

## 2019-07-28 DIAGNOSIS — Z803 Family history of malignant neoplasm of breast: Secondary | ICD-10-CM | POA: Diagnosis not present

## 2019-07-28 DIAGNOSIS — S02611A Fracture of condylar process of right mandible, initial encounter for closed fracture: Secondary | ICD-10-CM | POA: Diagnosis not present

## 2019-07-28 DIAGNOSIS — Z23 Encounter for immunization: Secondary | ICD-10-CM | POA: Diagnosis not present

## 2019-07-28 DIAGNOSIS — W19XXXA Unspecified fall, initial encounter: Secondary | ICD-10-CM

## 2019-07-28 DIAGNOSIS — Z7951 Long term (current) use of inhaled steroids: Secondary | ICD-10-CM | POA: Diagnosis not present

## 2019-07-28 DIAGNOSIS — Z20822 Contact with and (suspected) exposure to covid-19: Secondary | ICD-10-CM | POA: Diagnosis not present

## 2019-07-28 DIAGNOSIS — W010XXA Fall on same level from slipping, tripping and stumbling without subsequent striking against object, initial encounter: Secondary | ICD-10-CM | POA: Diagnosis not present

## 2019-07-28 DIAGNOSIS — Z9221 Personal history of antineoplastic chemotherapy: Secondary | ICD-10-CM | POA: Diagnosis not present

## 2019-07-28 DIAGNOSIS — R918 Other nonspecific abnormal finding of lung field: Secondary | ICD-10-CM | POA: Diagnosis not present

## 2019-07-28 DIAGNOSIS — S02609A Fracture of mandible, unspecified, initial encounter for closed fracture: Secondary | ICD-10-CM | POA: Diagnosis not present

## 2019-07-28 DIAGNOSIS — D701 Agranulocytosis secondary to cancer chemotherapy: Secondary | ICD-10-CM | POA: Diagnosis not present

## 2019-07-28 DIAGNOSIS — C50211 Malignant neoplasm of upper-inner quadrant of right female breast: Secondary | ICD-10-CM | POA: Diagnosis not present

## 2019-07-28 DIAGNOSIS — S0269XA Fracture of mandible of other specified site, initial encounter for closed fracture: Secondary | ICD-10-CM

## 2019-07-28 DIAGNOSIS — M25559 Pain in unspecified hip: Secondary | ICD-10-CM | POA: Diagnosis not present

## 2019-07-28 DIAGNOSIS — H409 Unspecified glaucoma: Secondary | ICD-10-CM | POA: Diagnosis not present

## 2019-07-28 DIAGNOSIS — M25551 Pain in right hip: Secondary | ICD-10-CM | POA: Diagnosis not present

## 2019-07-28 DIAGNOSIS — Z853 Personal history of malignant neoplasm of breast: Secondary | ICD-10-CM | POA: Diagnosis not present

## 2019-07-28 DIAGNOSIS — M2669 Other specified disorders of temporomandibular joint: Secondary | ICD-10-CM | POA: Diagnosis not present

## 2019-07-28 DIAGNOSIS — T451X5A Adverse effect of antineoplastic and immunosuppressive drugs, initial encounter: Secondary | ICD-10-CM | POA: Diagnosis not present

## 2019-07-28 DIAGNOSIS — G620 Drug-induced polyneuropathy: Secondary | ICD-10-CM | POA: Diagnosis not present

## 2019-07-28 DIAGNOSIS — Z9013 Acquired absence of bilateral breasts and nipples: Secondary | ICD-10-CM | POA: Diagnosis not present

## 2019-07-28 DIAGNOSIS — J449 Chronic obstructive pulmonary disease, unspecified: Secondary | ICD-10-CM | POA: Diagnosis not present

## 2019-07-28 DIAGNOSIS — Z79899 Other long term (current) drug therapy: Secondary | ICD-10-CM | POA: Diagnosis not present

## 2019-07-28 HISTORY — PX: CLOSED REDUCTION MANDIBLE: SHX5307

## 2019-07-28 LAB — BASIC METABOLIC PANEL
Anion gap: 11 (ref 5–15)
BUN: 12 mg/dL (ref 8–23)
CO2: 25 mmol/L (ref 22–32)
Calcium: 9.1 mg/dL (ref 8.9–10.3)
Chloride: 105 mmol/L (ref 98–111)
Creatinine, Ser: 0.84 mg/dL (ref 0.44–1.00)
GFR calc Af Amer: >60 mL/min (ref >60)
GFR calc non Af Amer: >60 mL/min (ref >60)
Glucose, Bld: 110 mg/dL — ABNORMAL HIGH (ref 70–99)
Potassium: 3.8 mmol/L (ref 3.5–5.1)
Sodium: 141 mmol/L (ref 135–145)

## 2019-07-28 LAB — CBC
HCT: 37.6 % (ref 36.0–46.0)
Hemoglobin: 12.6 g/dL (ref 12.0–15.0)
MCH: 30.4 pg (ref 26.0–34.0)
MCHC: 33.5 g/dL (ref 30.0–36.0)
MCV: 90.8 fL (ref 80.0–100.0)
Platelets: 134 10*3/uL — ABNORMAL LOW (ref 150–400)
RBC: 4.14 MIL/uL (ref 3.87–5.11)
RDW: 12.1 % (ref 11.5–15.5)
WBC: 5.7 10*3/uL (ref 4.0–10.5)
nRBC: 0 % (ref 0.0–0.2)

## 2019-07-28 LAB — SURGICAL PCR SCREEN
MRSA, PCR: NEGATIVE
Staphylococcus aureus: NEGATIVE

## 2019-07-28 LAB — VITAMIN D 25 HYDROXY (VIT D DEFICIENCY, FRACTURES): Vit D, 25-Hydroxy: 29.21 ng/mL — ABNORMAL LOW (ref 30–100)

## 2019-07-28 LAB — RESPIRATORY PANEL BY RT PCR (FLU A&B, COVID)
Influenza A by PCR: NEGATIVE
Influenza B by PCR: NEGATIVE
SARS Coronavirus 2 by RT PCR: NEGATIVE

## 2019-07-28 SURGERY — CLOSED REDUCTION, MANDIBLE
Anesthesia: General | Laterality: Right

## 2019-07-28 MED ORDER — SODIUM CHLORIDE (PF) 0.9 % IJ SOLN
INTRAMUSCULAR | Status: AC
  Filled 2019-07-28: qty 10

## 2019-07-28 MED ORDER — SENNA 8.6 MG PO TABS: 2.0000 | ORAL_TABLET | Freq: Every day | ORAL | Status: DC | PRN

## 2019-07-28 MED ORDER — DEXAMETHASONE SODIUM PHOSPHATE 10 MG/ML IJ SOLN
INTRAMUSCULAR | Status: DC | PRN
  Administered 2019-07-28: 10 mg via INTRAVENOUS

## 2019-07-28 MED ORDER — ONDANSETRON HCL 4 MG/2ML IJ SOLN
INTRAMUSCULAR | Status: AC
  Filled 2019-07-28: qty 2

## 2019-07-28 MED ORDER — CALCIUM CITRATE 950 (200 CA) MG PO TABS
200.0000 mg | ORAL_TABLET | Freq: Every day | ORAL | Status: DC
  Administered 2019-07-28: 200 mg via ORAL
  Filled 2019-07-28: qty 1

## 2019-07-28 MED ORDER — CEFAZOLIN SODIUM-DEXTROSE 2-4 GM/100ML-% IV SOLN
INTRAVENOUS | Status: AC
  Filled 2019-07-28: qty 100

## 2019-07-28 MED ORDER — DIPHENHYDRAMINE HCL 50 MG/ML IJ SOLN
INTRAMUSCULAR | Status: DC | PRN
  Administered 2019-07-28: 6.25 mg via INTRAVENOUS

## 2019-07-28 MED ORDER — PROPOFOL 10 MG/ML IV BOLUS
INTRAVENOUS | Status: DC | PRN
  Administered 2019-07-28: 120 mg via INTRAVENOUS

## 2019-07-28 MED ORDER — NEOMYCIN-POLYMYXIN-DEXAMETH 3.5-10000-0.1 OP OINT
TOPICAL_OINTMENT | OPHTHALMIC | Status: AC
  Filled 2019-07-28: qty 3.5

## 2019-07-28 MED ORDER — PROPOFOL 10 MG/ML IV BOLUS
INTRAVENOUS | Status: AC
  Filled 2019-07-28: qty 20

## 2019-07-28 MED ORDER — ACETAMINOPHEN 160 MG/5ML PO SOLN: 500.0000 mg | Freq: Four times a day (QID) | ORAL | Status: DC | PRN

## 2019-07-28 MED ORDER — LIDOCAINE-EPINEPHRINE 1 %-1:100000 IJ SOLN
INTRAMUSCULAR | Status: DC | PRN
  Administered 2019-07-28: 10 mL

## 2019-07-28 MED ORDER — MIDAZOLAM HCL 2 MG/2ML IJ SOLN
INTRAMUSCULAR | Status: AC
  Filled 2019-07-28: qty 2

## 2019-07-28 MED ORDER — SODIUM CHLORIDE 0.9 % IV SOLN: INTRAVENOUS | Status: DC

## 2019-07-28 MED ORDER — LACTATED RINGERS IV SOLN: INTRAVENOUS | Status: DC | PRN

## 2019-07-28 MED ORDER — IBUPROFEN 600 MG PO TABS: 600.0000 mg | ORAL_TABLET | Freq: Four times a day (QID) | ORAL | Status: DC

## 2019-07-28 MED ORDER — POLYETHYLENE GLYCOL 3350 17 G PO PACK
17.0000 g | PACK | Freq: Every day | ORAL | Status: DC
  Administered 2019-07-28: 17 g via ORAL
  Filled 2019-07-28: qty 1

## 2019-07-28 MED ORDER — OXYMETAZOLINE HCL 0.05 % NA SOLN
NASAL | Status: DC | PRN
  Administered 2019-07-28 (×2): 1 via NASAL
  Administered 2019-07-28: 2 via NASAL

## 2019-07-28 MED ORDER — ONDANSETRON HCL 4 MG/2ML IJ SOLN
4.0000 mg | Freq: Once | INTRAMUSCULAR | Status: AC
  Administered 2019-07-28: 4 mg via INTRAVENOUS
  Filled 2019-07-28: qty 2

## 2019-07-28 MED ORDER — CHLORHEXIDINE GLUCONATE 0.12 % MT SOLN: 15.0000 mL | Freq: Two times a day (BID) | OROMUCOSAL | 0 refills | Status: DC

## 2019-07-28 MED ORDER — SUGAMMADEX SODIUM 200 MG/2ML IV SOLN
INTRAVENOUS | Status: DC | PRN
  Administered 2019-07-28: 200 mg via INTRAVENOUS

## 2019-07-28 MED ORDER — MIDAZOLAM HCL 5 MG/5ML IJ SOLN
INTRAMUSCULAR | Status: DC | PRN
  Administered 2019-07-28 (×2): 1 mg via INTRAVENOUS

## 2019-07-28 MED ORDER — ROCURONIUM BROMIDE 10 MG/ML (PF) SYRINGE
PREFILLED_SYRINGE | INTRAVENOUS | Status: AC
  Filled 2019-07-28: qty 10

## 2019-07-28 MED ORDER — FLUTICASONE FUROATE-VILANTEROL 100-25 MCG/INH IN AEPB
1.0000 | INHALATION_SPRAY | Freq: Every day | RESPIRATORY_TRACT | Status: DC
  Filled 2019-07-28: qty 28

## 2019-07-28 MED ORDER — ONDANSETRON HCL 4 MG/2ML IJ SOLN
INTRAMUSCULAR | Status: DC | PRN
  Administered 2019-07-28: 4 mg via INTRAVENOUS

## 2019-07-28 MED ORDER — IBUPROFEN 100 MG/5ML PO SUSP
400.0000 mg | Freq: Four times a day (QID) | ORAL | Status: DC
  Administered 2019-07-28: 17:00:00 400 mg via ORAL
  Filled 2019-07-28: qty 20

## 2019-07-28 MED ORDER — 0.9 % SODIUM CHLORIDE (POUR BTL) OPTIME
TOPICAL | Status: DC | PRN
  Administered 2019-07-28: 1000 mL

## 2019-07-28 MED ORDER — PHENYLEPHRINE 40 MCG/ML (10ML) SYRINGE FOR IV PUSH (FOR BLOOD PRESSURE SUPPORT)
PREFILLED_SYRINGE | INTRAVENOUS | Status: AC
  Filled 2019-07-28: qty 10

## 2019-07-28 MED ORDER — CEFAZOLIN SODIUM-DEXTROSE 2-3 GM-%(50ML) IV SOLR
INTRAVENOUS | Status: DC | PRN
  Administered 2019-07-28: 2 g via INTRAVENOUS

## 2019-07-28 MED ORDER — ROCURONIUM BROMIDE 50 MG/5ML IV SOSY
PREFILLED_SYRINGE | INTRAVENOUS | Status: DC | PRN
  Administered 2019-07-28: 60 mg via INTRAVENOUS

## 2019-07-28 MED ORDER — GLYCOPYRROLATE PF 0.2 MG/ML IJ SOSY
PREFILLED_SYRINGE | INTRAMUSCULAR | Status: DC | PRN
  Administered 2019-07-28: .1 mg via INTRAVENOUS

## 2019-07-28 MED ORDER — FENTANYL CITRATE (PF) 250 MCG/5ML IJ SOLN
INTRAMUSCULAR | Status: AC
  Filled 2019-07-28: qty 5

## 2019-07-28 MED ORDER — ONDANSETRON 4 MG PO TBDP: 4.0000 mg | ORAL_TABLET | Freq: Three times a day (TID) | ORAL | 0 refills | Status: DC | PRN

## 2019-07-28 MED ORDER — DEXAMETHASONE SODIUM PHOSPHATE 10 MG/ML IJ SOLN
INTRAMUSCULAR | Status: AC
  Filled 2019-07-28: qty 1

## 2019-07-28 MED ORDER — FENTANYL CITRATE (PF) 100 MCG/2ML IJ SOLN
INTRAMUSCULAR | Status: DC | PRN
  Administered 2019-07-28 (×2): 50 ug via INTRAVENOUS

## 2019-07-28 MED ORDER — MUPIROCIN 2 % EX OINT: 1.0000 "application " | TOPICAL_OINTMENT | Freq: Two times a day (BID) | CUTANEOUS | Status: DC

## 2019-07-28 MED ORDER — LIDOCAINE 2% (20 MG/ML) 5 ML SYRINGE
INTRAMUSCULAR | Status: AC
  Filled 2019-07-28: qty 5

## 2019-07-28 MED ORDER — CHLORHEXIDINE GLUCONATE 0.12 % MT SOLN
OROMUCOSAL | Status: AC
  Filled 2019-07-28: qty 30

## 2019-07-28 MED ORDER — IBUPROFEN 100 MG/5ML PO SUSP: 400.0000 mg | Freq: Four times a day (QID) | ORAL | 1 refills | Status: DC

## 2019-07-28 MED ORDER — NETARSUDIL-LATANOPROST 0.02-0.005 % OP SOLN: 1.0000 [drp] | Freq: Every day | OPHTHALMIC | Status: DC

## 2019-07-28 MED ORDER — OXYCODONE HCL 5 MG PO TABS
5.0000 mg | ORAL_TABLET | ORAL | Status: DC | PRN
  Administered 2019-07-28: 10:00:00 10 mg via ORAL
  Filled 2019-07-28: qty 2

## 2019-07-28 MED ORDER — LIDOCAINE-EPINEPHRINE 1 %-1:100000 IJ SOLN
INTRAMUSCULAR | Status: AC
  Filled 2019-07-28: qty 1

## 2019-07-28 MED ORDER — GLYCOPYRROLATE PF 0.2 MG/ML IJ SOSY
PREFILLED_SYRINGE | INTRAMUSCULAR | Status: AC
  Filled 2019-07-28: qty 1

## 2019-07-28 MED ORDER — LACTATED RINGERS IV SOLN: INTRAVENOUS | Status: DC

## 2019-07-28 MED ORDER — POLYETHYLENE GLYCOL 3350 17 G PO PACK: 17.0000 g | PACK | Freq: Every day | ORAL | Status: DC | PRN

## 2019-07-28 MED ORDER — OXYMETAZOLINE HCL 0.05 % NA SOLN
NASAL | Status: AC
  Filled 2019-07-28: qty 30

## 2019-07-28 MED ORDER — MORPHINE SULFATE (PF) 2 MG/ML IV SOLN
2.0000 mg | INTRAVENOUS | Status: DC | PRN
  Administered 2019-07-28 (×4): 2 mg via INTRAVENOUS
  Filled 2019-07-28 (×4): qty 1

## 2019-07-28 MED ORDER — LIDOCAINE 2% (20 MG/ML) 5 ML SYRINGE
INTRAMUSCULAR | Status: DC | PRN
  Administered 2019-07-28: 60 mg via INTRAVENOUS

## 2019-07-28 MED ORDER — CHLORHEXIDINE GLUCONATE 0.12 % MT SOLN
OROMUCOSAL | Status: DC | PRN
  Administered 2019-07-28 (×2): 15 mL via OROMUCOSAL

## 2019-07-28 MED ORDER — ACETAMINOPHEN 160 MG/5ML PO SOLN: 500.0000 mg | Freq: Four times a day (QID) | ORAL | 1 refills | Status: DC | PRN

## 2019-07-28 MED ORDER — PHENYLEPHRINE 40 MCG/ML (10ML) SYRINGE FOR IV PUSH (FOR BLOOD PRESSURE SUPPORT)
PREFILLED_SYRINGE | INTRAVENOUS | Status: DC | PRN
  Administered 2019-07-28 (×2): 80 ug via INTRAVENOUS

## 2019-07-28 MED ORDER — ENOXAPARIN SODIUM 40 MG/0.4ML ~~LOC~~ SOLN: 40.0000 mg | SUBCUTANEOUS | Status: DC

## 2019-07-28 SURGICAL SUPPLY — 37 items
BAR FIX PREFORMED OMNIMAX (Miscellaneous) ×4 IMPLANT
BLADE SURG 10 STRL SS (BLADE) ×3 IMPLANT
BLADE SURG 15 STRL LF DISP TIS (BLADE) IMPLANT
BLADE SURG 15 STRL SS (BLADE) ×2
CANISTER SUCT 3000ML PPV (MISCELLANEOUS) ×3 IMPLANT
CLEANER TIP ELECTROSURG 2X2 (MISCELLANEOUS) ×2 IMPLANT
COVER SURGICAL LIGHT HANDLE (MISCELLANEOUS) ×3 IMPLANT
COVER WAND RF STERILE (DRAPES) ×1 IMPLANT
DECANTER SPIKE VIAL GLASS SM (MISCELLANEOUS) ×3 IMPLANT
DRAPE HALF SHEET 40X57 (DRAPES) ×2 IMPLANT
GAUZE 4X4 16PLY RFD (DISPOSABLE) ×3 IMPLANT
GLOVE ECLIPSE 7.5 STRL STRAW (GLOVE) ×3 IMPLANT
GOWN STRL REUS W/ TWL LRG LVL3 (GOWN DISPOSABLE) ×2 IMPLANT
GOWN STRL REUS W/TWL LRG LVL3 (GOWN DISPOSABLE) ×4
KIT BASIN OR (CUSTOM PROCEDURE TRAY) ×3 IMPLANT
KIT TURNOVER KIT B (KITS) ×3 IMPLANT
NDL HYPO 25GX1X1/2 BEV (NEEDLE) IMPLANT
NDL PRECISIONGLIDE 27X1.5 (NEEDLE) ×1 IMPLANT
NEEDLE HYPO 25GX1X1/2 BEV (NEEDLE) IMPLANT
NEEDLE PRECISIONGLIDE 27X1.5 (NEEDLE) ×3 IMPLANT
NS IRRIG 1000ML POUR BTL (IV SOLUTION) ×3 IMPLANT
PACK SURGICAL SETUP 50X90 (CUSTOM PROCEDURE TRAY) ×3 IMPLANT
PAD ARMBOARD 7.5X6 YLW CONV (MISCELLANEOUS) ×2 IMPLANT
POSITIONER HEAD DONUT 9IN (MISCELLANEOUS) ×2 IMPLANT
SCREW BONE 2X7 CROSS DRIVE (Screw) ×8 IMPLANT
SCREW BONE MANDIB SD 2X11 (Screw) ×10 IMPLANT
SCREW BONE MANDIB SD 2X9 (Screw) ×4 IMPLANT
SUCTION FRAZIER HANDLE 10FR (MISCELLANEOUS) ×2
SUCTION TUBE FRAZIER 10FR DISP (MISCELLANEOUS) IMPLANT
SUT CHROMIC 3 0 PS 2 (SUTURE) ×3 IMPLANT
SUT CHROMIC 4 0 PS 2 18 (SUTURE) ×3 IMPLANT
SYR CONTROL 10ML LL (SYRINGE) ×3 IMPLANT
TUBE CONNECTING 12'X1/4 (SUCTIONS) ×1
TUBE CONNECTING 12X1/4 (SUCTIONS) ×2 IMPLANT
WATER STERILE IRR 1000ML POUR (IV SOLUTION) ×1 IMPLANT
WIRE 24 GAUGE OMINIMAX MMF (WIRE) ×2 IMPLANT
YANKAUER SUCT BULB TIP NO VENT (SUCTIONS) ×3 IMPLANT

## 2019-07-28 NOTE — Transfer of Care (Signed)
Immediate Anesthesia Transfer of Care Note  Patient: Wendy [name]  Procedure(s) Performed: CLOSED REDUCTION MAXILO-MANDIBULAR FIXATION WITH 25GA WIRE (Right )  Patient Location: PACU  Anesthesia Type:General  Level of Consciousness: awake and patient cooperative  Airway & Oxygen Therapy: Patient Spontanous Breathing and Patient connected to face mask oxygen  Post-op Assessment: Report given to RN and Post -op Vital signs reviewed and stable  Post vital signs: Reviewed and stable  Last Vitals:  Vitals Value Taken Time  BP    Temp    Pulse    Resp    SpO2      Last Pain:  Vitals:   07/28/19 1023  TempSrc:   PainSc: 5       Patients Stated Pain Goal: 1 ([id number])  Complications: No apparent anesthesia complications

## 2019-07-28 NOTE — Anesthesia Preprocedure Evaluation (Addendum)
Anesthesia Evaluation  Patient identified by MRN, date of birth, ID band Patient awake    Reviewed: Allergy & Precautions, NPO status , Patient's Chart, lab work & pertinent test results  History of Anesthesia Complications Negative for: history of anesthetic complications  Airway Mallampati: IV  TM Distance: >3 FB Neck ROM: Full  Mouth opening: Limited Mouth Opening  Dental  (+) Dental Advisory Given, Teeth Intact   Pulmonary asthma ,    Pulmonary exam normal        Cardiovascular negative cardio ROS Normal cardiovascular exam     Neuro/Psych  Headaches,  Neuromuscular disease (chemo-induced neuropathy) negative psych ROS   GI/Hepatic negative GI ROS, Neg liver ROS,   Endo/Other  negative endocrine ROS  Renal/GU negative Renal ROS     Musculoskeletal negative musculoskeletal ROS (+)   Abdominal   Peds  Hematology  Thrombocytopenia    Anesthesia Other Findings   Reproductive/Obstetrics  Breast cancer s/p b/l mastectomy and chemotherapy                              Anesthesia Physical Anesthesia Plan  ASA: II  Anesthesia Plan: General   Post-op Pain Management:    Induction: Intravenous  PONV Risk Score and Plan: 3 and Treatment may vary due to age or medical condition, Ondansetron and Dexamethasone  Airway Management Planned: Nasal ETT  Additional Equipment: None  Intra-op Plan:   Post-operative Plan: Extubation in OR  Informed Consent: I have reviewed the patients History and Physical, chart, labs and discussed the procedure including the risks, benefits and alternatives for the proposed anesthesia with the patient or authorized representative who has indicated his/her understanding and acceptance.     Dental advisory given  Plan Discussed with: CRNA, Anesthesiologist and Surgeon  Anesthesia Plan Comments:        Anesthesia Quick Evaluation

## 2019-07-28 NOTE — Op Note (Signed)
Operative Report  Date: 07/28/19 Patient: Wendy [name] MRN: [id number]  Diagnosis: Right mandibular condyle fracture Procedure: Closed Reduction, Maxillomandibular fixation Complications: none EBL: minimal  Indications: This is a 74 year old female who presented to [facility] ED after a fall from standing. Upon arrival, she was found to have a right mandibular condyle fracture and associated malocclusion. Based on the location of the fracture, intermaxillary fixation is the only viable treatment option. All aspect of the procedure were discussed and consent was obtained.   Procedure: The patient was brought to the operating room and placed supine on the operating room table. After successful induction via nasotracheal intubation, the patient was draped in a manner suitable for this type of oral and maxillofacial procedure. Surgeons donned sterile gown and gloves. A total of 10cc 1% lidocaine with 1:100k epi was injected as local infiltration and bilateral inferior alveolar nerve blocks. A moist 4x4 throat pack was placed. The patient oral cavity was rinsed with peridex. Biomet Omnimax hybrid arch bars were placed utilizing 76mm screws in the maxilla and 7 and 85mm screws in the mandible. The patient's fracture was then reduced manually and she was found to intercuspate well, premorbid. 24ga wires were then used to fixed the patient in this position. An orogastric tube was passed and the contents of the patient's stomach were suctioned. The patient was successfully extubated and transported to the PACU in stable condition.

## 2019-07-28 NOTE — Anesthesia Procedure Notes (Signed)
Procedure Name: Intubation Date/Time: 07/28/2019 1:29 PM Performed by: Orlie Dakin, CRNA Pre-anesthesia Checklist: Patient identified, Emergency Drugs available, Suction available and Patient being monitored Patient Re-evaluated:Patient Re-evaluated prior to induction Oxygen Delivery Method: Circle system utilized Preoxygenation: Pre-oxygenation with 100% oxygen Induction Type: IV induction Ventilation: Mask ventilation without difficulty Laryngoscope Size: Glidescope and 3 Grade View: Grade I Nasal Tubes: Right, Nasal prep performed and Nasal Rae Tube size: 7.0 mm Number of attempts: 1 Airway Equipment and Method: Video-laryngoscopy Placement Confirmation: ETT inserted through vocal cords under direct vision and positive ETCO2 Tube secured with: Tape Dental Injury: Teeth and Oropharynx as per pre-operative assessment  Comments: Afrin spray bilat nare, lubricated NTT placed with slight resistance right nare and without difficulty with Glidescope.

## 2019-07-28 NOTE — Discharge Instructions (Signed)
We have sent you home with both liquid tylenol and liquid ibuprofen.  You can alternate between taking the two medications every 3 hours.    We have also prescribed you a dissolvable anti nausea medication.  If you feel nauseous put a tablet in your cheek and let it dissolve.   Only drink liquids until the wires are removed.  You can drink nutritional drinks such as boost or glucerna as much as you'd like.  Try to get at least 1200 calories in some form or another every day.    If you need to vomit or are choking for any reason you can remove the wires with the provided wire cutters.   We would like you to contact your regular dentist once you leave the hospital to see if they would like to set up an appointment with you.  They can answer any further questions until you see the oral surgeon in two weeks.    It is important that your jaws remain together and movement is avoided to allow normal healing. Avoid trying to move your jaws or play with the wires, rubber bands, etc. -- this will cause them to loosen or break. If wires or elastics do become loose or break, notify our office; they need to be replaced or tightened!  There are several important don'ts when your jaws are wired shut:  You should not be in water more than waist deep! No swimming. No alcoholic beverages -- no medications or drugs that may make you sleepy or drowsy. No contact sports or activities that might cause any type of injury to the face.  Swelling Ice can be applied to the lower face for the first 1-2 days. After 3-4 days, heat may be beneficial in decreasing swelling. A heating pad, hot water bottle, or cloth with hot water can be used. Be careful not to burn the skin.  Swelling typically takes 10-14 days to resolve. After the first few days, swelling should begin to decrease. If you notice an increase in swelling after this time period, it may indicate an infection or another problem, and you should contact our  office.  Pain Initially, you may have pain. Take prescribed medication as needed. Make sure you get some type of food or a drink in your system before taking your pain medication, as it can cause nausea. Do not attempt to move or open your jaws. This action only increases the pain, may delay healing, and may cause muscle spasms in your jaw muscles. A non-steroidal anti-inflammatory (such as ibuprofen, Advil, Motrin, or Aleve) may be taken along with your prescription medication if needed for pain.  Nausea and Vomiting Avoid all alcohol and any foods that may cause your stomach to become upset. Stay well hydrated and avoid taking pain medication on any empty stomach. Should you experience nausea, you can use over-the-counter Dramamine as directed on the bottle. If nausea persists, please contact our office. Anti-nausea medicine can be prescribed if nausea persists. If you feel that you are going to vomit:  Bend forward or roll onto your side. Put your finger inside your cheek alongside the teeth and pull your cheek outward. Remember that everything you are taking in is liquid. If anything comes out, it will be liquid as well -- the stomach contents can escape around the teeth and will come out of your nose and mouth. It is not recommended that you try to cut your wires or elastics when you think you are going to  vomit.  Activities Avoid strenuous activity that causes you to clench your jaws or leaves you short of breath. No swimming or contact sports are allowed. You may bathe or shower and wash your hair.  Diet Since your jaws may be held together with elastics, you will require what is called a balanced fluid diet (pureed in a blender). It is essential that your body receive adequate fluids and nourishment to maintain your nutritional status and promote healing. It is not unusual to lose 5-10 lbs during the first week after surgery -- this is mostly water weight. You may lose 5-10% of your  normal body weight while jaws are wired shut. You will be limited to a liquid diet until your jaws are no longer tightly held together. It is especially important to drink adequate amounts of fluids, 3-4 liters per day. You can purchase liquid nutritional supplements (such as Ensure or Boost) in grocery stores or pharmacies.  Instead of eating the usual 3 meals a day, eat more frequent small meals to increase caloric and nutritional intake. Initially, you will find it easier to use the plastic syringe to squirt liquid inside your cheek to the back of your mouth -- when comfortable, you can use a straw or drink from a glass. Extremely hot or cold food can be uncomfortable to eat, especially through a straw. Eat foods served at room temperature. Don't forget to add variety to your meals. Experiment with different foods and flavors.  You may eat anything that can be thinned into liquid form. Meals may be blended until smooth. If food is still lumpy, use a strainer. Cold whole milk can be used to thin puddings, yogurt, cereal, sandwiches, ice cream, and cakes. Warm whole milk can be used to thin cheese, eggs, toast, hot cereal, muffins, pasta, hot main dishes, and casseroles. Fruit juice can be used to thin fruit, yogurt, and ice cream.  Oral Hygiene It is very important to keep your mouth clean, especially if you have wounds inside the mouth that are healing. Rinse vigorously with warm tap water after each meal or snack -- a syringe with a curved plastic tip helps to squirt water into hard-to-reach areas. Brush thoroughly after meals and before bed with a child-sized soft toothbrush. Use the prescription mouth rinse (PeridexT, also known as chlorhexidine) or Listerine twice a day -- swish for 1-2 minutes with 1 tablespoon, then spit. Using a Waterpik device is great for cleaning but should not be used for at least 2 weeks if you have surgical wounds inside the mouth. You must do this as thoroughly as  possible. You will not be able to brush the tongue side of your teeth with a brush. The tongue side of the teeth can be brushed by moving your tongue across them while using a mouth rinse.  Sharp Wires or Sore spots Use wax to cover wires or hooks that may be causing discomfort, such as poking your cheeks, lips, or gums. Do this by using a very small piece to cover the wire or hook at the site of discomfort. Remove the wax before brushing teeth and eating or drinking.  Care of Skin Incisions Depending on the type of surgery, it may have been necessary to make an incision on your skin or to repair lacerations or wounds associated with your injury. Care of these wounds is important to prevent infection and minimize scarring. Keep wounds covered with a dressing. Once the stitches are removed, keep the wound clean by gently  washing twice a day and applying a topical antibiotic ointment (Bacitracin, etc.).  For the first 6 months, avoid excessive sun exposure to your scar -- apply sunblock and wear a brimmed hat.  Wires and Rubber Bands Do not cut, remove, bend, or twist wires! If wires or braces begin to irritate cheeks or lips, a small piece of wax can be placed over these sharp areas temporarily. Point out these spots during your next office visit. Vaseline or other lip lubricants aid in comfort.  Loose wires may compromise or delay healing or result in fracture healing improperly. Call the office if you can open your mouth or move your jaws!  Remember, the wires or rubber bands can be stretched or broken, causing the problems noted above, or teeth can be loosened and damaged. Don't try to fight them and open or move your jaw. If you need to cough, sneeze, or yawn, use your hand to support below your chin to avoid the tendency to try to open your mouth!  Jaw Fracture Eating Plan A break (fracture) of the jaw bone often needs surgery for treatment. After surgery, you will need to eat foods that can  be blended so that they can be sipped from a straw or given through a syringe. Work with a diet and nutrition specialist (dietitian) to create an eating plan that helps you get the nutrients you need in order to heal and stay healthy. What are tips for following this plan? General guidelines  All foods in this plan must be blended. Avoid nuts, seeds, skins, peels, bones, or any foods that cannot be blended to the right consistency.  Ask your health care provider about taking a liquid multivitamin to make sure that you get all the vitamins and minerals you need. Cooking   Before blending, remove any skins, seeds, or peels from food.  Cook meats and vegetables until tender.  Cut foods into small pieces and mix with a small amount of liquid in a food processor or blender. Continue to add liquid until the food becomes thin enough to sip through a straw.  Add liquids such as juice, milk, cream, broth, gravy, or vegetable juice to help add flavor to foods.  Heat foods after they have been blended, not before. This reduces the amount of foam created from blending.  If you need to increase calories in food: ? Add protein powder or powdered milk to foods. ? Cook with fats, such as margarine (without trans fat), sour cream, cream cheese, cream, or nut butters. ? Prepare foods with sweeteners, such as honey, ice cream, blackstrap molasses, or sugar. Meal planning  Eat at least three meals and three snacks daily. It is important to make sure that you get enough calories and protein to prevent weight loss and help your body heal, especially after surgery.  Eat a variety of foods from each food group every day, including fruits and vegetables, protein, whole grains, dairy, and healthy fats.  If your teeth and mouth are sensitive to extreme temperatures, heat or cool your foods to lukewarm temperatures. What foods are recommended? The items listed may not be a complete list. Talk with your dietitian  about what dietary choices are best for you. Grains Hot cereals, such as oatmeal, grits, ground wheat cereals, and polenta. Rice and pasta. Couscous. Vegetables All cooked or canned vegetables, without seeds and skins. Vegetable juices. Cooked potatoes, without skins. Fruits Any cooked or canned fruits, without seeds and skins. Fresh, peeled soft fruits, such  as bananas and peaches, that can be blended until smooth. All fruit juices, without seeds and skins. Meat and other protein foods Soft-boiled eggs, scrambled eggs, powdered eggs, pasteurized egg mixtures, and custard. Ground meats, such as hamburger, [place], sausage, and meatloaf. Tender, well-cooked meat, poultry, and fish, prepared without bones or skin. Soft soy foods, such as tofu. Smooth nut butters. Liquid egg substitutes. Dairy Milk. Cheese. Yogurt. Cottage cheese. Pudding. Beverages Coffee (regular or decaffeinated), tea, and mineral water. Liquid supplements that have protein and calories. Fats and oils Any oils. Melted margarine or butter. Ghee. Sour cream. Cream cheese. Avocado. Seasoning and other foods All seasonings and condiments that blend well. Ground spices. Finely ground seeds and nuts. Mustard or any smooth condiment. Summary  Foods in this plan need to be prepared so that they can be sipped from a straw or given through a syringe. Try to have at least three meals and three snacks daily.  Avoid nuts, seeds, skins, peels, bones, or any foods that cannot be blended to the right consistency. Make sure you eat a variety of foods from each food group every day.  Include a liquid multivitamin in your plan as told by your health care provider or dietitian. This information is not intended to replace advice given to you by your health care provider. Make sure you discuss any questions you have with your health care provider. Document Revised: 09/11/2018 Document Reviewed: 08/27/2016 Elsevier Patient Education  [place].

## 2019-07-28 NOTE — Evaluation (Addendum)
Occupational Therapy Evaluation and Discharge Patient Details Name: Wendy [name] MRN: [id number] DOB: Oct 18, 1945 Today's Date: 07/28/2019    History of Present Illness Pt is a 74 y/o female presenting with comminuted, displaced R mandibular head fx involving the TMJ s/p fall.  Plan for plan for closed reduction maxillomandibular fixation 2/24. PMH is significant for chronic bronchitis, pulmonary nodules, h/o of breast ca type 1A 2015 and chemotheraphy related neutropenia, glaucoma.   Clinical Impression   Patient supine in bed and eager to get OOB upon entry. Admitted for above and presenting near baseline, independent level for ADLs, transfers and mobility. Reports pain in back baseline and decreased with mobility this morning.  Simulated tub transfers with modified independence.  Patient reports she will have support at home as needed.  Based on performance today, no further OT needs have been identified and OT will sign off. If further needs arise post jaw wiring, please re-consult. Thank you for this referral.     Follow Up Recommendations  No OT follow up    Equipment Recommendations  None recommended by OT    Recommendations for Other Services       Precautions / Restrictions Precautions Precautions: Fall Restrictions Weight Bearing Restrictions: No      Mobility Bed Mobility Overal bed mobility: Independent                Transfers Overall transfer level: Modified independent                    Balance Overall balance assessment: No apparent balance deficits (not formally assessed)                                         ADL either performed or assessed with clinical judgement   ADL Overall ADL's : Modified independent                                       General ADL Comments: patient demonstrates in room mobility, toileting/toilet transfers, grooming with modified indepedence, pain improved with mobility and no  LOB noted     Vision         Perception     Praxis      Pertinent Vitals/Pain Pain Assessment: Faces Faces Pain Scale: Hurts a little bit Pain Location: back Pain Descriptors / Indicators: Discomfort Pain Intervention(s): Monitored during session;Repositioned     Hand Dominance Right   Extremity/Trunk Assessment Upper Extremity Assessment Upper Extremity Assessment: Overall WFL for tasks assessed   Lower Extremity Assessment Lower Extremity Assessment: Overall WFL for tasks assessed   Cervical / Trunk Assessment Cervical / Trunk Assessment: Normal   Communication Communication Communication: No difficulties   Cognition Arousal/Alertness: Awake/alert Behavior During Therapy: WFL for tasks assessed/performed Overall Cognitive Status: Within Functional Limits for tasks assessed                                     General Comments       Exercises     Shoulder Instructions      Home Living Family/patient expects to be discharged to:: Private residence Living Arrangements: Spouse/significant other Available Help at Discharge: Family;Available 24 hours/day Type of Home: House Home Access: Stairs to enter  Entrance Stairs-Number of Steps: 2 Entrance Stairs-Rails: Left;Right Home Layout: Multi-level;Able to live on main level with bedroom/bathroom     Bathroom Shower/Tub: Teacher, early years/pre: Handicapped height     Home Equipment: Grab bars - tub/shower          Prior Functioning/Environment Level of Independence: Independent        Comments: independent, driving        OT Problem List:        OT Treatment/Interventions:      OT Goals(Current goals can be found in the care plan section) Acute Rehab OT Goals Patient Stated Goal: home soon OT Goal Formulation: With patient  OT Frequency:     Barriers to D/C:            Co-evaluation              AM-PAC OT "6 Clicks" Daily Activity     Outcome  Measure Help from another person eating meals?: None Help from another person taking care of personal grooming?: None Help from another person toileting, which includes using toliet, bedpan, or urinal?: None Help from another person bathing (including washing, rinsing, drying)?: None Help from another person to put on and taking off regular upper body clothing?: None Help from another person to put on and taking off regular lower body clothing?: None 6 Click Score: 24   End of Session Nurse Communication: Mobility status  Activity Tolerance: Patient tolerated treatment well Patient left: in chair;with call bell/phone within reach  OT Visit Diagnosis: History of falling (Z91.81);Other abnormalities of gait and mobility (R26.89)                Time: [id number] OT Time Calculation (min): 16 min Charges:  OT General Charges $OT Visit: 1 Visit OT Evaluation $OT Eval Low Complexity: 1 Low  Jolaine Artist, OT Acute Rehabilitation Services Pager [phone] Office [phone]   Wendy [name] 07/28/2019, 12:29 PM

## 2019-07-28 NOTE — Anesthesia Postprocedure Evaluation (Signed)
Anesthesia Post Note  Patient: Wendy [name]  Procedure(s) Performed: CLOSED REDUCTION MAXILO-MANDIBULAR FIXATION WITH 25GA WIRE (Right )     Patient location during evaluation: PACU Anesthesia Type: General Level of consciousness: awake and alert Pain management: pain level controlled Vital Signs Assessment: post-procedure vital signs reviewed and stable Respiratory status: spontaneous breathing, nonlabored ventilation and respiratory function stable Cardiovascular status: blood pressure returned to baseline and stable Postop Assessment: no apparent nausea or vomiting Anesthetic complications: no    Last Vitals:  Vitals:   07/28/19 1417 07/28/19 1432  BP: (!) 122/56 124/69  Pulse: 75 65  Resp: 14 14  Temp: 36.7 C   SpO2: 100% 100%    Last Pain:  Vitals:   07/28/19 1417  TempSrc:   PainSc: Fletcher Zaidyn Claire

## 2019-07-28 NOTE — Progress Notes (Signed)
Family Medicine Teaching Service Daily Progress Note Intern Pager: [phone]  Patient name: Wendy [name] Medical record number: [id number] Date of birth: 03-26-1946 Age: 74 y.o. Gender: female  Primary Care Provider: Street, [name], MD Consultants: Oral surgery Code Status: Full  Pt Overview and Major Events to Date:  02/23-admitted  Assessment and Plan: Wendy [name] is a 74 y.o. female presenting with comminuted, displaced R mandibular head fx involving the TMJ s/p fall. PMH is significant for chronic bronchitis, pulmonary nodules, h/o of breast ca type 1A 2015 and chemotheraphy related neutropenia, glaucoma.  R mandibular head fx s/p fall  R hip pain Surgery planned for today at 1300. Continues to have pain but is controlled with current meds.  Denies any dizziness, headaches, chest pain or shortness of breath. Mild rt side facial swelling, ecchymosis over chin area  -Surgery following, plan for maxillomandibular fixation today -Follow-up surgery recs -Pain management -Diet as tolerated status post surgery -Follow up EKG -Vital signs per unit  -SCDs  Chronic Bronchitis  H/O Abnormal Lung CT Chronic.  Stable.  Denies any shortness of breath. -Continue top monitor.  H/O Breast Cancer  s/p double mastectomy Chronic.  Stable.  FEN/GI:  -N.p.o.  Prophylaxis:  -SCDs  Disposition: Potential discharge tomorrow pending surgery recs  Subjective:  No acute events overnight. Reports pain is tolerable with medications.  Denies any headaches, dizziness, chest pain or shortness of breath.  Objective: Temp:  [98.2 F (36.8 C)-98.3 F (36.8 C)] 98.3 F (36.8 C) (02/24 0528) Pulse Rate:  [63-86] 67 (02/24 0528) Resp:  [16-18] 18 (02/24 0528) BP: (98-136)/(45-65) 98/45 (02/24 0528) SpO2:  [99 %-100 %] 99 % (02/24 0528) Physical Exam: General: 73y.o female in no acute distress  Cardiovascular: RRR, no murmurs or gallops appreciated Respiratory: CTAB, no crackles or  murmurs appreciated Abdomen: soft, non tender, non distended.  BS present Extremities: no lower extremity edema noted  Laboratory: Recent Labs  Lab 07/27/19 1840 07/28/19 0142  WBC 4.8 5.7  HGB 12.5 12.6  HCT 37.4 37.6  PLT 136* 134*   Recent Labs  Lab 07/27/19 1840 07/28/19 0142  NA 138 141  K 4.0 3.8  CL 103 105  CO2 25 25  BUN 15 12  CREATININE 0.90 0.84  CALCIUM 9.3 9.1  GLUCOSE 121* 110*      Imaging/Diagnostic Tests: CT Head Wo Contrast  Result Date: 07/27/2019 CLINICAL DATA:  Fall, jaw pain EXAM: CT HEAD WITHOUT CONTRAST TECHNIQUE: Contiguous axial images were obtained from the base of the skull through the vertex without intravenous contrast. COMPARISON:  None. FINDINGS: Brain: No evidence of acute territorial infarction, hemorrhage, hydrocephalus,extra-axial collection or mass lesion/mass effect. There is dilatation the ventricles and sulci consistent with age-related atrophy. Low-attenuation changes in the deep white matter consistent with small vessel ischemia. Vascular: No hyperdense vessel or unexpected calcification. Skull: The skull is intact. No fracture or focal lesion identified. Sinuses/Orbits: The visualized paranasal sinuses and mastoid air cells are clear. The orbits and globes intact. Other: None Face: Osseous: There is a comminuted impacted fracture seen of the right mandibular head. There is lateral subluxation of a portion of the mandibular head and the ramus. A portion of the mandibular head still articulates with the temporomandibular joint. No other fractures are identified. Orbits: No fracture identified. Unremarkable appearance of globes and orbits. Sinuses: The visualized paranasal sinuses and mastoid air cells are unremarkable. Soft tissues: Soft tissue swelling seen over the right lower jaw. Limited intracranial: No acute findings. Cervical  spine: Alignment: There is straightening of the normal cervical lordosis. Skull base and vertebrae: Visualized  skull base is intact. No atlanto-occipital dissociation. The vertebral body heights are well maintained. No fracture or pathologic osseous lesion seen. Soft tissues and spinal canal: The visualized paraspinal soft tissues are unremarkable. No prevertebral soft tissue swelling is seen. The spinal canal is grossly unremarkable, no large epidural collection or significant canal narrowing. Disc levels: Mild disc osteophyte complex and uncovertebral osteophytes is most notable at C5-C6. Upper chest: Biapical scarring is noted. Thoracic inlet is within normal limits. Other: None IMPRESSION: No acute intracranial abnormality. Findings consistent with age related atrophy and chronic small vessel ischemia Comminuted mildly displaced right mandibular head fracture involving the TMJ. No acute fracture or malalignment of the spine. Electronically Signed   By: Prudencio Pair M.D.   On: 07/27/2019 22:50   CT Cervical Spine Wo Contrast  Result Date: 07/27/2019 CLINICAL DATA:  Fall, jaw pain EXAM: CT HEAD WITHOUT CONTRAST TECHNIQUE: Contiguous axial images were obtained from the base of the skull through the vertex without intravenous contrast. COMPARISON:  None. FINDINGS: Brain: No evidence of acute territorial infarction, hemorrhage, hydrocephalus,extra-axial collection or mass lesion/mass effect. There is dilatation the ventricles and sulci consistent with age-related atrophy. Low-attenuation changes in the deep white matter consistent with small vessel ischemia. Vascular: No hyperdense vessel or unexpected calcification. Skull: The skull is intact. No fracture or focal lesion identified. Sinuses/Orbits: The visualized paranasal sinuses and mastoid air cells are clear. The orbits and globes intact. Other: None Face: Osseous: There is a comminuted impacted fracture seen of the right mandibular head. There is lateral subluxation of a portion of the mandibular head and the ramus. A portion of the mandibular head still articulates  with the temporomandibular joint. No other fractures are identified. Orbits: No fracture identified. Unremarkable appearance of globes and orbits. Sinuses: The visualized paranasal sinuses and mastoid air cells are unremarkable. Soft tissues: Soft tissue swelling seen over the right lower jaw. Limited intracranial: No acute findings. Cervical spine: Alignment: There is straightening of the normal cervical lordosis. Skull base and vertebrae: Visualized skull base is intact. No atlanto-occipital dissociation. The vertebral body heights are well maintained. No fracture or pathologic osseous lesion seen. Soft tissues and spinal canal: The visualized paraspinal soft tissues are unremarkable. No prevertebral soft tissue swelling is seen. The spinal canal is grossly unremarkable, no large epidural collection or significant canal narrowing. Disc levels: Mild disc osteophyte complex and uncovertebral osteophytes is most notable at C5-C6. Upper chest: Biapical scarring is noted. Thoracic inlet is within normal limits. Other: None IMPRESSION: No acute intracranial abnormality. Findings consistent with age related atrophy and chronic small vessel ischemia Comminuted mildly displaced right mandibular head fracture involving the TMJ. No acute fracture or malalignment of the spine. Electronically Signed   By: Prudencio Pair M.D.   On: 07/27/2019 22:50   DG HIP UNILAT WITH PELVIS 2-3 VIEWS RIGHT  Result Date: 07/28/2019 CLINICAL DATA:  [place] yesterday, right hip pain EXAM: DG HIP (WITH OR WITHOUT PELVIS) 2-3V RIGHT COMPARISON:  None. FINDINGS: Frontal view of the pelvis as well as frontal and frogleg lateral views of the right hip are obtained. Alignment is anatomic. Joint spaces are well preserved. No acute displaced fracture. Sacroiliac joints are normal. IMPRESSION: 1. Unremarkable pelvis and right hip. Electronically Signed   By: Randa Ngo M.D.   On: 07/28/2019 02:39   CT Maxillofacial Wo Contrast  Result Date:  07/27/2019 CLINICAL DATA:  Fall, jaw pain  EXAM: CT HEAD WITHOUT CONTRAST TECHNIQUE: Contiguous axial images were obtained from the base of the skull through the vertex without intravenous contrast. COMPARISON:  None. FINDINGS: Brain: No evidence of acute territorial infarction, hemorrhage, hydrocephalus,extra-axial collection or mass lesion/mass effect. There is dilatation the ventricles and sulci consistent with age-related atrophy. Low-attenuation changes in the deep white matter consistent with small vessel ischemia. Vascular: No hyperdense vessel or unexpected calcification. Skull: The skull is intact. No fracture or focal lesion identified. Sinuses/Orbits: The visualized paranasal sinuses and mastoid air cells are clear. The orbits and globes intact. Other: None Face: Osseous: There is a comminuted impacted fracture seen of the right mandibular head. There is lateral subluxation of a portion of the mandibular head and the ramus. A portion of the mandibular head still articulates with the temporomandibular joint. No other fractures are identified. Orbits: No fracture identified. Unremarkable appearance of globes and orbits. Sinuses: The visualized paranasal sinuses and mastoid air cells are unremarkable. Soft tissues: Soft tissue swelling seen over the right lower jaw. Limited intracranial: No acute findings. Cervical spine: Alignment: There is straightening of the normal cervical lordosis. Skull base and vertebrae: Visualized skull base is intact. No atlanto-occipital dissociation. The vertebral body heights are well maintained. No fracture or pathologic osseous lesion seen. Soft tissues and spinal canal: The visualized paraspinal soft tissues are unremarkable. No prevertebral soft tissue swelling is seen. The spinal canal is grossly unremarkable, no large epidural collection or significant canal narrowing. Disc levels: Mild disc osteophyte complex and uncovertebral osteophytes is most notable at C5-C6. Upper  chest: Biapical scarring is noted. Thoracic inlet is within normal limits. Other: None IMPRESSION: No acute intracranial abnormality. Findings consistent with age related atrophy and chronic small vessel ischemia Comminuted mildly displaced right mandibular head fracture involving the TMJ. No acute fracture or malalignment of the spine. Electronically Signed   By: Prudencio Pair M.D.   On: 07/27/2019 22:50    Carollee Leitz, MD 07/28/2019, 5:45 AM PGY-1, [facility] Intern pager: [phone], text pages welcome

## 2019-07-28 NOTE — ED Notes (Signed)
Attempted report x1. 

## 2019-07-28 NOTE — Discharge Summary (Addendum)
[facility] Discharge Summary  Patient name: Wendy [name] Medical record number: [id number] Date of birth: 1946/01/13 Age: 74 y.o. Gender: female Date of Admission: 07/27/2019  Date of Discharge: 07/28/2019 Admitting Physician: Benay Pike, MD  Primary Care Provider: Street, [name], MD Consultants: Oral Surgery  Indication for Hospitalization: Jaw fracture  Discharge Diagnoses/Problem List:  Right Mandibular head fracture Fall Chronic Bronchitis Breast Cancer s/p double Mastectomy   Disposition: Home  Discharge Condition: Stable  Discharge Exam: 07/28/2019 BP (!) 112/55 (BP Location: Right Arm)   Pulse 66   Temp 97.8 F (36.6 C) (Oral)   Resp 14   Ht 5\' 6"  (1.676 m)   Wt 72.6 kg   SpO2 100%   BMI 25.82 kg/m  General: 73y.o female in no acute distress  HEENT: mandible wired shut.  Some swelling on face.   Cardiovascular: RRR, no murmurs or gallops appreciated Respiratory: CTAB, no crackles or murmurs appreciated Abdomen: soft, non tender, non distended.  BS present Extremities: no lower extremity edema noted  Brief Hospital Course:  Patient admitted s/p mechanical fall at home.  No LOC, dizziness and completely aware of surroundings.  She was seen by her dentist who referred her to maxillo facial surgeon who obtained an xray that ws impressive for possible right mandibular fracture.  CT head showed no acute intracranial abnormalities.  CT cervical spine showed no acute abnormality.  CT maxillofacial showed communirted mildly displaced right mandibular head fracture involving the TMJ.  Oral Surgery was consulted and she was taken to the OR for closed reduction, Maxillomandibular fixation. She was discharged after surgery with wire jaw cutters and instructions for care provided by surgeon. Pt was given liquid tylenol and ibuprofen as well as ODT zofran for nausea.   Issues for Follow Up:  1. Follow up with Oral Surgery in 2  weeks 2. Risk for Fall evaluation with PT or pcp  Significant Procedures: 02/24- Closed Reduction Maxillomandibular fixation  Significant Labs and Imaging:  Recent Labs  Lab 07/27/19 1840 07/28/19 0142  WBC 4.8 5.7  HGB 12.5 12.6  HCT 37.4 37.6  PLT 136* 134*   Recent Labs  Lab 07/27/19 1840 07/28/19 0142  NA 138 141  K 4.0 3.8  CL 103 105  CO2 25 25  GLUCOSE 121* 110*  BUN 15 12  CREATININE 0.90 0.84  CALCIUM 9.3 9.1      Results/Tests Pending at Time of Discharge: None  Discharge Medications:  Allergies as of 07/28/2019   No Known Allergies     Medication List    TAKE these medications   acetaminophen 160 MG/5ML solution Commonly known as: TYLENOL Take 15.6 mLs (500 mg total) by mouth every 6 (six) hours as needed.   budesonide-formoterol 80-4.5 MCG/ACT inhaler Commonly known as: Symbicort Inhale 2 puffs into the lungs 2 (two) times daily.   CALCIUM PO Take 1 tablet by mouth in the morning and at bedtime.   chlorhexidine 0.12 % solution Commonly known as: PERIDEX Use as directed 15 mLs in the mouth or throat 2 (two) times daily.   ibuprofen 100 MG/5ML suspension Commonly known as: ADVIL Take 20 mLs (400 mg total) by mouth every 6 (six) hours.   ondansetron 4 MG disintegrating tablet Commonly known as: ZOFRAN-ODT Take 1 tablet (4 mg total) by mouth every 8 (eight) hours as needed for nausea or vomiting.   Rocklatan 0.02-0.005 % Soln Generic drug: Netarsudil-Latanoprost Apply 1 drop to eye daily.  Discharge Instructions: Please refer to Patient Instructions section of EMR for full details.  Patient was counseled important signs and symptoms that should prompt return to medical care, changes in medications, dietary instructions, activity restrictions, and follow up appointments.   Follow-Up Appointments: Follow-up Information    Zelphia Cairo P, DMD In 2 weeks.   Specialty: Dentistry Why: To remove fixation wires, change to  elastics Contact information: [place] [id number] [phone]           Carollee Leitz, MD 08/01/2019, 3:47 PM PGY-1, [place]  Resident Addendum I have separately seen and examined the patient.  I have discussed the findings and exam with the resident and agree with the above note.  I helped develop the management plan that is described in the resident's note and I agree with the content.  Changes have been made in BLUE.    Addison Naegeli, MD PGY-2 [facility] residency program

## 2019-07-28 NOTE — Consult Note (Signed)
Reason for Consult: Mandible Fracture  Wendy [name] is an 74 y.o. female.  HPI: PT presents to the ED after seeing a local general dentist who indicated the patient has a jaw fracture. She reports a fall from standing around 3pm yesterday during which she struck her chin on the ground, -LOC. CT scan on arrival showing intracapsular fracture of the right mandibular condyle. CT head negative for intracranial bleeds. She reports that her bite feels off and that the tooth on the back right is hitting prematurely. Patient resting comfortably in bed on my exam, no acute distress  Past Medical History:  Diagnosis Date  . Asthma    as a child  . Asthmatic bronchitis    "about q yr" (09/07/2013)  . Breast cancer ([facility])    "right breast"   . Family history of anesthesia complication    "daughter PONV; long time in recovery" (09/07/2013)  . Pneumonia    "several times; usually follows asthmatic bronchitis" (09/07/2013)  . Sinus headache    "maybe 2-3 times/month"    Past Surgical History:  Procedure Laterality Date  . BREAST BIOPSY Right 07/2013  . DILATION AND CURETTAGE OF UTERUS     S/P miscarriage  . FINGER FRACTURE SURGERY Left 2010   "middle finger spiral fx; little finger broken; tissue damage"  . MASTECTOMY COMPLETE / SIMPLE  09/07/2013  . MASTECTOMY COMPLETE / SIMPLE W/ SENTINEL NODE BIOPSY Right 09/07/2013  . MASTECTOMY W/ SENTINEL NODE BIOPSY Bilateral 09/07/2013   Procedure: BILATERAL MASTECTOMY WITH RIGHT SENTINEL LYMPH NODE BIOPSY;  Surgeon: [name], MD;  Location: [facility];  Service: General;  Laterality: Bilateral;  . PORT-A-CATH REMOVAL Right 04/19/2014   Procedure: REMOVAL PORT-A-CATH;  Surgeon: Fanny Skates, MD;  Location: [facility];  Service: General;  Laterality: Right;  . PORTACATH PLACEMENT Right 09/07/2013  . PORTACATH PLACEMENT Right 09/07/2013   Procedure: INSERTION PORT-A-CATH;  Surgeon: [name], MD;  Location: [facility];  Service: General;  Laterality:  Right;  . TONSILLECTOMY    . TUBAL LIGATION  1977  . WRIST FRACTURE SURGERY Left 2010    Family History  Problem Relation Age of Onset  . Bladder Cancer Sister 7       worked in [employer], around 2nd hand smoke all her life  . Diabetes Sister   . Colon cancer Brother 59       non-smoker  . Diabetes Brother   . Colon cancer Maternal Uncle 35  . Osteoporosis Mother   . Parkinson's disease Mother   . Diabetes Mother   . Rheum arthritis Father   . Diabetes Father   . Breast cancer Paternal Aunt        dx over 60  . Diabetes Maternal Grandmother   . Diabetes Maternal Grandfather   . Uterine cancer Other 50  . Colon cancer Maternal Aunt        dx in her 31s-70s  . Breast cancer Cousin        paternal cousin dx in her 23s-70s    Social History:  reports that she has never smoked. She has never used smokeless tobacco. She reports that she does not drink alcohol or use drugs.  Allergies: No Known Allergies  Medications: I have reviewed the patient's current medications.  Results for orders placed or performed during the hospital encounter of 07/27/19 (from the past 48 hour(s))  Basic metabolic panel     Status: Abnormal   Collection Time: 07/27/19  6:40 PM  Result Value Ref Range   Sodium 138 135 - 145 mmol/L   Potassium 4.0 3.5 - 5.1 mmol/L   Chloride 103 98 - 111 mmol/L   CO2 25 22 - 32 mmol/L   Glucose, Bld 121 (H) 70 - 99 mg/dL    Comment: Glucose reference range applies only to samples taken after fasting for at least 8 hours.   BUN 15 8 - 23 mg/dL   Creatinine, Ser 0.90 0.44 - 1.00 mg/dL   Calcium 9.3 8.9 - 10.3 mg/dL   GFR calc non Af Amer >60 >60 mL/min   GFR calc Af Amer >60 >60 mL/min   Anion gap 10 5 - 15    Comment: Performed at [facility] [address] [place], [place] [address]  CBC     Status: Abnormal   Collection Time: 07/27/19  6:40 PM  Result Value Ref Range   WBC 4.8 4.0 - 10.5 K/uL   RBC 4.05 3.87 - 5.11 MIL/uL   Hemoglobin 12.5 12.0 -  15.0 g/dL   HCT 37.4 36.0 - 46.0 %   MCV 92.3 80.0 - 100.0 fL   MCH 30.9 26.0 - 34.0 pg   MCHC 33.4 30.0 - 36.0 g/dL   RDW 12.3 11.5 - 15.5 %   Platelets 136 (L) 150 - 400 K/uL    Comment: REPEATED TO VERIFY   nRBC 0.0 0.0 - 0.2 %    Comment: Performed at [facility], [place] [address] [place], [place] [address]  Respiratory Panel by RT PCR (Flu A&B, Covid) - Nasopharyngeal Swab     Status: None   Collection Time: 07/27/19 11:10 PM   Specimen: Nasopharyngeal Swab  Result Value Ref Range   SARS Coronavirus 2 by RT PCR NEGATIVE NEGATIVE    Comment: (NOTE) SARS-CoV-2 target nucleic acids are NOT DETECTED. The SARS-CoV-2 RNA is generally detectable in upper respiratoy specimens during the acute phase of infection. The lowest concentration of SARS-CoV-2 viral copies this assay can detect is 131 copies/mL. A negative result does not preclude SARS-Cov-2 infection and should not be used as the sole basis for treatment or other patient management decisions. A negative result may occur with  improper specimen collection/handling, submission of specimen other than nasopharyngeal swab, presence of viral mutation(s) within the areas targeted by this assay, and inadequate number of viral copies (<131 copies/mL). A negative result must be combined with clinical observations, patient history, and epidemiological information. The expected result is Negative. Fact Sheet for Patients:  PinkCheek.be Fact Sheet for Healthcare Providers:  GravelBags.it This test is not yet ap proved or cleared by the [place] FDA and  has been authorized for detection and/or diagnosis of SARS-CoV-2 by FDA under an Emergency Use Authorization (EUA). This EUA will remain  in effect (meaning this test can be used) for the duration of the COVID-19 declaration under Section 564(b)(1) of the Act, 21 U.S.C. section 360bbb-3(b)(1), unless the authorization is  terminated or revoked sooner.    Influenza A by PCR NEGATIVE NEGATIVE   Influenza B by PCR NEGATIVE NEGATIVE    Comment: (NOTE) The Xpert Xpress SARS-CoV-2/FLU/RSV assay is intended as an aid in  the diagnosis of influenza from Nasopharyngeal swab specimens and  should not be used as a sole basis for treatment. Nasal washings and  aspirates are unacceptable for Xpert Xpress SARS-CoV-2/FLU/RSV  testing. Fact Sheet for Patients: PinkCheek.be Fact Sheet for Healthcare Providers: GravelBags.it This test is not yet approved or cleared by the  [place] Architectural technologist and  has been authorized for detection and/or diagnosis of SARS-CoV-2 by  FDA under an Print production planner (EUA). This EUA will remain  in effect (meaning this test can be used) for the duration of the  Covid-19 declaration under Section 564(b)(1) of the Act, 21  U.S.C. section 360bbb-3(b)(1), unless the authorization is  terminated or revoked. Performed at [facility], [place] [address] [place], [place] [id number]   Basic metabolic panel     Status: Abnormal   Collection Time: 07/28/19  1:42 AM  Result Value Ref Range   Sodium 141 135 - 145 mmol/L   Potassium 3.8 3.5 - 5.1 mmol/L   Chloride 105 98 - 111 mmol/L   CO2 25 22 - 32 mmol/L   Glucose, Bld 110 (H) 70 - 99 mg/dL    Comment: Glucose reference range applies only to samples taken after fasting for at least 8 hours.   BUN 12 8 - 23 mg/dL   Creatinine, Ser 0.84 0.44 - 1.00 mg/dL   Calcium 9.1 8.9 - 10.3 mg/dL   GFR calc non Af Amer >60 >60 mL/min   GFR calc Af Amer >60 >60 mL/min   Anion gap 11 5 - 15    Comment: Performed at [facility] [address], [place], [place] [address]  CBC     Status: Abnormal   Collection Time: 07/28/19  1:42 AM  Result Value Ref Range   WBC 5.7 4.0 - 10.5 K/uL   RBC 4.14 3.87 - 5.11 MIL/uL   Hemoglobin 12.6 12.0 - 15.0 g/dL   HCT 37.6 36.0 - 46.0 %   MCV 90.8  80.0 - 100.0 fL   MCH 30.4 26.0 - 34.0 pg   MCHC 33.5 30.0 - 36.0 g/dL   RDW 12.1 11.5 - 15.5 %   Platelets 134 (L) 150 - 400 K/uL    Comment: REPEATED TO VERIFY   nRBC 0.0 0.0 - 0.2 %    Comment: Performed at [facility], [place] [address] [place], [place] [address]  Surgical PCR screen     Status: None   Collection Time: 07/28/19  2:20 AM   Specimen: Nasal Mucosa; Nasal Swab  Result Value Ref Range   MRSA, PCR NEGATIVE NEGATIVE   Staphylococcus aureus NEGATIVE NEGATIVE    Comment: (NOTE) The Xpert SA Assay (FDA approved for NASAL specimens in patients 37 years of age and older), is one component of a comprehensive surveillance program. It is not intended to diagnose infection nor to guide or monitor treatment. Performed at [facility], [place] [address], [place], [place] [address]     CT Head Wo Contrast  Result Date: 07/27/2019 CLINICAL DATA:  Fall, jaw pain EXAM: CT HEAD WITHOUT CONTRAST TECHNIQUE: Contiguous axial images were obtained from the base of the skull through the vertex without intravenous contrast. COMPARISON:  None. FINDINGS: Brain: No evidence of acute territorial infarction, hemorrhage, hydrocephalus,extra-axial collection or mass lesion/mass effect. There is dilatation the ventricles and sulci consistent with age-related atrophy. Low-attenuation changes in the deep white matter consistent with small vessel ischemia. Vascular: No hyperdense vessel or unexpected calcification. Skull: The skull is intact. No fracture or focal lesion identified. Sinuses/Orbits: The visualized paranasal sinuses and mastoid air cells are clear. The orbits and globes intact. Other: None Face: Osseous: There is a comminuted impacted fracture seen of the right mandibular head. There is lateral subluxation of a portion of the mandibular head and the  ramus. A portion of the mandibular head still articulates with the temporomandibular joint. No other fractures are identified. Orbits: No  fracture identified. Unremarkable appearance of globes and orbits. Sinuses: The visualized paranasal sinuses and mastoid air cells are unremarkable. Soft tissues: Soft tissue swelling seen over the right lower jaw. Limited intracranial: No acute findings. Cervical spine: Alignment: There is straightening of the normal cervical lordosis. Skull base and vertebrae: Visualized skull base is intact. No atlanto-occipital dissociation. The vertebral body heights are well maintained. No fracture or pathologic osseous lesion seen. Soft tissues and spinal canal: The visualized paraspinal soft tissues are unremarkable. No prevertebral soft tissue swelling is seen. The spinal canal is grossly unremarkable, no large epidural collection or significant canal narrowing. Disc levels: Mild disc osteophyte complex and uncovertebral osteophytes is most notable at C5-C6. Upper chest: Biapical scarring is noted. Thoracic inlet is within normal limits. Other: None IMPRESSION: No acute intracranial abnormality. Findings consistent with age related atrophy and chronic small vessel ischemia Comminuted mildly displaced right mandibular head fracture involving the TMJ. No acute fracture or malalignment of the spine. Electronically Signed   By: Prudencio Pair M.D.   On: 07/27/2019 22:50   CT Cervical Spine Wo Contrast  Result Date: 07/27/2019 CLINICAL DATA:  Fall, jaw pain EXAM: CT HEAD WITHOUT CONTRAST TECHNIQUE: Contiguous axial images were obtained from the base of the skull through the vertex without intravenous contrast. COMPARISON:  None. FINDINGS: Brain: No evidence of acute territorial infarction, hemorrhage, hydrocephalus,extra-axial collection or mass lesion/mass effect. There is dilatation the ventricles and sulci consistent with age-related atrophy. Low-attenuation changes in the deep white matter consistent with small vessel ischemia. Vascular: No hyperdense vessel or unexpected calcification. Skull: The skull is intact. No  fracture or focal lesion identified. Sinuses/Orbits: The visualized paranasal sinuses and mastoid air cells are clear. The orbits and globes intact. Other: None Face: Osseous: There is a comminuted impacted fracture seen of the right mandibular head. There is lateral subluxation of a portion of the mandibular head and the ramus. A portion of the mandibular head still articulates with the temporomandibular joint. No other fractures are identified. Orbits: No fracture identified. Unremarkable appearance of globes and orbits. Sinuses: The visualized paranasal sinuses and mastoid air cells are unremarkable. Soft tissues: Soft tissue swelling seen over the right lower jaw. Limited intracranial: No acute findings. Cervical spine: Alignment: There is straightening of the normal cervical lordosis. Skull base and vertebrae: Visualized skull base is intact. No atlanto-occipital dissociation. The vertebral body heights are well maintained. No fracture or pathologic osseous lesion seen. Soft tissues and spinal canal: The visualized paraspinal soft tissues are unremarkable. No prevertebral soft tissue swelling is seen. The spinal canal is grossly unremarkable, no large epidural collection or significant canal narrowing. Disc levels: Mild disc osteophyte complex and uncovertebral osteophytes is most notable at C5-C6. Upper chest: Biapical scarring is noted. Thoracic inlet is within normal limits. Other: None IMPRESSION: No acute intracranial abnormality. Findings consistent with age related atrophy and chronic small vessel ischemia Comminuted mildly displaced right mandibular head fracture involving the TMJ. No acute fracture or malalignment of the spine. Electronically Signed   By: Prudencio Pair M.D.   On: 07/27/2019 22:50   DG HIP UNILAT WITH PELVIS 2-3 VIEWS RIGHT  Result Date: 07/28/2019 CLINICAL DATA:  [place] yesterday, right hip pain EXAM: DG HIP (WITH OR WITHOUT PELVIS) 2-3V RIGHT COMPARISON:  None. FINDINGS: Frontal view  of the pelvis as well as frontal and frogleg lateral views of the right  hip are obtained. Alignment is anatomic. Joint spaces are well preserved. No acute displaced fracture. Sacroiliac joints are normal. IMPRESSION: 1. Unremarkable pelvis and right hip. Electronically Signed   By: Randa Ngo M.D.   On: 07/28/2019 02:39   CT Maxillofacial Wo Contrast  Result Date: 07/27/2019 CLINICAL DATA:  Fall, jaw pain EXAM: CT HEAD WITHOUT CONTRAST TECHNIQUE: Contiguous axial images were obtained from the base of the skull through the vertex without intravenous contrast. COMPARISON:  None. FINDINGS: Brain: No evidence of acute territorial infarction, hemorrhage, hydrocephalus,extra-axial collection or mass lesion/mass effect. There is dilatation the ventricles and sulci consistent with age-related atrophy. Low-attenuation changes in the deep white matter consistent with small vessel ischemia. Vascular: No hyperdense vessel or unexpected calcification. Skull: The skull is intact. No fracture or focal lesion identified. Sinuses/Orbits: The visualized paranasal sinuses and mastoid air cells are clear. The orbits and globes intact. Other: None Face: Osseous: There is a comminuted impacted fracture seen of the right mandibular head. There is lateral subluxation of a portion of the mandibular head and the ramus. A portion of the mandibular head still articulates with the temporomandibular joint. No other fractures are identified. Orbits: No fracture identified. Unremarkable appearance of globes and orbits. Sinuses: The visualized paranasal sinuses and mastoid air cells are unremarkable. Soft tissues: Soft tissue swelling seen over the right lower jaw. Limited intracranial: No acute findings. Cervical spine: Alignment: There is straightening of the normal cervical lordosis. Skull base and vertebrae: Visualized skull base is intact. No atlanto-occipital dissociation. The vertebral body heights are well maintained. No fracture or  pathologic osseous lesion seen. Soft tissues and spinal canal: The visualized paraspinal soft tissues are unremarkable. No prevertebral soft tissue swelling is seen. The spinal canal is grossly unremarkable, no large epidural collection or significant canal narrowing. Disc levels: Mild disc osteophyte complex and uncovertebral osteophytes is most notable at C5-C6. Upper chest: Biapical scarring is noted. Thoracic inlet is within normal limits. Other: None IMPRESSION: No acute intracranial abnormality. Findings consistent with age related atrophy and chronic small vessel ischemia Comminuted mildly displaced right mandibular head fracture involving the TMJ. No acute fracture or malalignment of the spine. Electronically Signed   By: Prudencio Pair M.D.   On: 07/27/2019 22:50    Review of Systems  General: AAOx3, NAD Resp: no increased WOB, comfortable on RA CV: [place] hypotensive, well perfused   Blood pressure (!) 98/45, pulse 67, temperature 98.3 F (36.8 C), temperature source Oral, resp. rate 18, SpO2 99 %. Physical Exam  Maxillofacial Exam: Moderate edema overlying the right TMJ with +TTP ~2cm laceration of the chin, with overlying dermabond, associated ecchymosis V2/V3 intact Negative Battle's sign EOMI, PERRLA, VA/VF intact Superficial laceration of the midline lower lip, ~2cm in length  Intraoral Exam: Right posterior premature contact, left sided open bite Highly restored dentition with multiple crowns, but grossly intact, no fractured teeth noted No soft tissue lacerations of the mouth Oropharynx patent  Assessment/Plan: 74yo F s/p fall from standing r/i right condylar head fracture causing malocclusion. Discussed based on the location of the injury, only viable treatment is with closed reduction, maxillomandibular fixation. Will proceed to the OR this afternoon. All questions answered.   Gardner Candle 07/28/2019, 8:54 AM

## 2019-07-29 ENCOUNTER — Encounter: Payer: Self-pay | Admitting: *Deleted

## 2019-08-02 DIAGNOSIS — M5136 Other intervertebral disc degeneration, lumbar region: Secondary | ICD-10-CM | POA: Diagnosis not present

## 2019-08-02 DIAGNOSIS — G8929 Other chronic pain: Secondary | ICD-10-CM | POA: Diagnosis not present

## 2019-08-02 DIAGNOSIS — M545 Low back pain: Secondary | ICD-10-CM | POA: Diagnosis not present

## 2019-08-02 DIAGNOSIS — S0292XD Unspecified fracture of facial bones, subsequent encounter for fracture with routine healing: Secondary | ICD-10-CM | POA: Diagnosis not present

## 2019-08-02 DIAGNOSIS — W19XXXA Unspecified fall, initial encounter: Secondary | ICD-10-CM | POA: Diagnosis not present

## 2019-08-03 DIAGNOSIS — M545 Low back pain: Secondary | ICD-10-CM | POA: Diagnosis not present

## 2019-08-10 DIAGNOSIS — M5440 Lumbago with sciatica, unspecified side: Secondary | ICD-10-CM | POA: Diagnosis not present

## 2019-08-10 DIAGNOSIS — Z6824 Body mass index (BMI) 24.0-24.9, adult: Secondary | ICD-10-CM | POA: Diagnosis not present

## 2019-08-10 DIAGNOSIS — M5136 Other intervertebral disc degeneration, lumbar region: Secondary | ICD-10-CM | POA: Diagnosis not present

## 2019-08-12 DIAGNOSIS — R52 Pain, unspecified: Secondary | ICD-10-CM | POA: Diagnosis not present

## 2019-08-16 DIAGNOSIS — M47817 Spondylosis without myelopathy or radiculopathy, lumbosacral region: Secondary | ICD-10-CM | POA: Diagnosis not present

## 2019-08-16 DIAGNOSIS — M545 Low back pain: Secondary | ICD-10-CM | POA: Diagnosis not present

## 2019-08-19 DIAGNOSIS — R52 Pain, unspecified: Secondary | ICD-10-CM | POA: Diagnosis not present

## 2019-08-23 DIAGNOSIS — M461 Sacroiliitis, not elsewhere classified: Secondary | ICD-10-CM | POA: Diagnosis not present

## 2019-08-23 DIAGNOSIS — M792 Neuralgia and neuritis, unspecified: Secondary | ICD-10-CM | POA: Diagnosis not present

## 2019-08-23 DIAGNOSIS — M62838 Other muscle spasm: Secondary | ICD-10-CM | POA: Diagnosis not present

## 2019-08-23 DIAGNOSIS — G894 Chronic pain syndrome: Secondary | ICD-10-CM | POA: Diagnosis not present

## 2019-09-02 DIAGNOSIS — M5416 Radiculopathy, lumbar region: Secondary | ICD-10-CM | POA: Diagnosis not present

## 2019-09-02 DIAGNOSIS — M5136 Other intervertebral disc degeneration, lumbar region: Secondary | ICD-10-CM | POA: Diagnosis not present

## 2019-09-02 DIAGNOSIS — M47896 Other spondylosis, lumbar region: Secondary | ICD-10-CM | POA: Diagnosis not present

## 2019-09-12 DIAGNOSIS — R52 Pain, unspecified: Secondary | ICD-10-CM | POA: Diagnosis not present

## 2019-09-19 DIAGNOSIS — R52 Pain, unspecified: Secondary | ICD-10-CM | POA: Diagnosis not present

## 2019-09-21 DIAGNOSIS — M47816 Spondylosis without myelopathy or radiculopathy, lumbar region: Secondary | ICD-10-CM | POA: Diagnosis not present

## 2019-10-12 DIAGNOSIS — M5416 Radiculopathy, lumbar region: Secondary | ICD-10-CM | POA: Diagnosis not present

## 2019-10-12 DIAGNOSIS — R52 Pain, unspecified: Secondary | ICD-10-CM | POA: Diagnosis not present

## 2019-10-13 DIAGNOSIS — M8580 Other specified disorders of bone density and structure, unspecified site: Secondary | ICD-10-CM | POA: Diagnosis not present

## 2019-10-13 DIAGNOSIS — I959 Hypotension, unspecified: Secondary | ICD-10-CM | POA: Diagnosis not present

## 2019-10-13 DIAGNOSIS — Z8781 Personal history of (healed) traumatic fracture: Secondary | ICD-10-CM | POA: Diagnosis not present

## 2019-10-13 DIAGNOSIS — E440 Moderate protein-calorie malnutrition: Secondary | ICD-10-CM | POA: Diagnosis not present

## 2019-10-13 DIAGNOSIS — R0989 Other specified symptoms and signs involving the circulatory and respiratory systems: Secondary | ICD-10-CM | POA: Diagnosis not present

## 2019-10-19 DIAGNOSIS — R52 Pain, unspecified: Secondary | ICD-10-CM | POA: Diagnosis not present

## 2019-10-25 DIAGNOSIS — M419 Scoliosis, unspecified: Secondary | ICD-10-CM | POA: Diagnosis not present

## 2019-10-25 DIAGNOSIS — M545 Low back pain: Secondary | ICD-10-CM | POA: Diagnosis not present

## 2019-10-25 DIAGNOSIS — M5136 Other intervertebral disc degeneration, lumbar region: Secondary | ICD-10-CM | POA: Diagnosis not present

## 2019-11-03 DIAGNOSIS — N3091 Cystitis, unspecified with hematuria: Secondary | ICD-10-CM | POA: Diagnosis not present

## 2019-11-09 DIAGNOSIS — M4120 Other idiopathic scoliosis, site unspecified: Secondary | ICD-10-CM | POA: Diagnosis not present

## 2019-11-09 DIAGNOSIS — M5136 Other intervertebral disc degeneration, lumbar region: Secondary | ICD-10-CM | POA: Diagnosis not present

## 2019-11-09 DIAGNOSIS — M47816 Spondylosis without myelopathy or radiculopathy, lumbar region: Secondary | ICD-10-CM | POA: Diagnosis not present

## 2019-11-11 DIAGNOSIS — R3911 Hesitancy of micturition: Secondary | ICD-10-CM | POA: Diagnosis not present

## 2019-11-11 DIAGNOSIS — Z6821 Body mass index (BMI) 21.0-21.9, adult: Secondary | ICD-10-CM | POA: Diagnosis not present

## 2019-11-12 DIAGNOSIS — R52 Pain, unspecified: Secondary | ICD-10-CM | POA: Diagnosis not present

## 2019-11-22 DIAGNOSIS — M47816 Spondylosis without myelopathy or radiculopathy, lumbar region: Secondary | ICD-10-CM | POA: Diagnosis not present

## 2019-11-30 DIAGNOSIS — M5136 Other intervertebral disc degeneration, lumbar region: Secondary | ICD-10-CM | POA: Diagnosis not present

## 2019-12-01 DIAGNOSIS — Z6821 Body mass index (BMI) 21.0-21.9, adult: Secondary | ICD-10-CM | POA: Diagnosis not present

## 2019-12-01 DIAGNOSIS — R0989 Other specified symptoms and signs involving the circulatory and respiratory systems: Secondary | ICD-10-CM | POA: Diagnosis not present

## 2019-12-01 DIAGNOSIS — E440 Moderate protein-calorie malnutrition: Secondary | ICD-10-CM | POA: Diagnosis not present

## 2019-12-12 DIAGNOSIS — R52 Pain, unspecified: Secondary | ICD-10-CM | POA: Diagnosis not present

## 2019-12-15 DIAGNOSIS — M5136 Other intervertebral disc degeneration, lumbar region: Secondary | ICD-10-CM | POA: Diagnosis not present

## 2019-12-15 DIAGNOSIS — M4120 Other idiopathic scoliosis, site unspecified: Secondary | ICD-10-CM | POA: Diagnosis not present

## 2019-12-15 DIAGNOSIS — M47816 Spondylosis without myelopathy or radiculopathy, lumbar region: Secondary | ICD-10-CM | POA: Diagnosis not present

## 2019-12-29 DIAGNOSIS — M5135 Other intervertebral disc degeneration, thoracolumbar region: Secondary | ICD-10-CM | POA: Diagnosis not present

## 2019-12-29 DIAGNOSIS — M47815 Spondylosis without myelopathy or radiculopathy, thoracolumbar region: Secondary | ICD-10-CM | POA: Diagnosis not present

## 2019-12-29 DIAGNOSIS — R948 Abnormal results of function studies of other organs and systems: Secondary | ICD-10-CM | POA: Diagnosis not present

## 2019-12-29 DIAGNOSIS — M4120 Other idiopathic scoliosis, site unspecified: Secondary | ICD-10-CM | POA: Diagnosis not present

## 2020-01-10 DIAGNOSIS — M5416 Radiculopathy, lumbar region: Secondary | ICD-10-CM | POA: Diagnosis not present

## 2020-01-12 DIAGNOSIS — R52 Pain, unspecified: Secondary | ICD-10-CM | POA: Diagnosis not present

## 2020-01-18 DIAGNOSIS — R739 Hyperglycemia, unspecified: Secondary | ICD-10-CM | POA: Diagnosis not present

## 2020-01-18 DIAGNOSIS — M5126 Other intervertebral disc displacement, lumbar region: Secondary | ICD-10-CM | POA: Diagnosis not present

## 2020-01-18 DIAGNOSIS — M5136 Other intervertebral disc degeneration, lumbar region: Secondary | ICD-10-CM | POA: Diagnosis not present

## 2020-01-18 DIAGNOSIS — M5416 Radiculopathy, lumbar region: Secondary | ICD-10-CM | POA: Diagnosis not present

## 2020-01-18 DIAGNOSIS — D72819 Decreased white blood cell count, unspecified: Secondary | ICD-10-CM | POA: Diagnosis not present

## 2020-01-25 DIAGNOSIS — Z79899 Other long term (current) drug therapy: Secondary | ICD-10-CM | POA: Diagnosis not present

## 2020-01-25 DIAGNOSIS — M5136 Other intervertebral disc degeneration, lumbar region: Secondary | ICD-10-CM | POA: Diagnosis not present

## 2020-01-25 DIAGNOSIS — M545 Low back pain: Secondary | ICD-10-CM | POA: Diagnosis not present

## 2020-01-25 DIAGNOSIS — I700 Atherosclerosis of aorta: Secondary | ICD-10-CM | POA: Diagnosis not present

## 2020-01-26 DIAGNOSIS — M545 Low back pain: Secondary | ICD-10-CM | POA: Diagnosis not present

## 2020-03-13 DIAGNOSIS — R52 Pain, unspecified: Secondary | ICD-10-CM | POA: Diagnosis not present

## 2020-04-13 DIAGNOSIS — R52 Pain, unspecified: Secondary | ICD-10-CM | POA: Diagnosis not present

## 2020-05-08 DIAGNOSIS — I498 Other specified cardiac arrhythmias: Secondary | ICD-10-CM | POA: Diagnosis not present

## 2020-05-08 DIAGNOSIS — E785 Hyperlipidemia, unspecified: Secondary | ICD-10-CM | POA: Diagnosis not present

## 2020-05-08 DIAGNOSIS — R0989 Other specified symptoms and signs involving the circulatory and respiratory systems: Secondary | ICD-10-CM | POA: Diagnosis not present

## 2020-05-08 DIAGNOSIS — M5416 Radiculopathy, lumbar region: Secondary | ICD-10-CM | POA: Diagnosis not present

## 2020-05-08 DIAGNOSIS — Z79899 Other long term (current) drug therapy: Secondary | ICD-10-CM | POA: Diagnosis not present

## 2020-05-08 DIAGNOSIS — M5136 Other intervertebral disc degeneration, lumbar region: Secondary | ICD-10-CM | POA: Diagnosis not present

## 2020-05-08 DIAGNOSIS — R7302 Impaired glucose tolerance (oral): Secondary | ICD-10-CM | POA: Diagnosis not present

## 2020-05-08 DIAGNOSIS — Z0000 Encounter for general adult medical examination without abnormal findings: Secondary | ICD-10-CM | POA: Diagnosis not present

## 2020-05-13 DIAGNOSIS — R52 Pain, unspecified: Secondary | ICD-10-CM | POA: Diagnosis not present

## 2020-07-19 DIAGNOSIS — L820 Inflamed seborrheic keratosis: Secondary | ICD-10-CM | POA: Diagnosis not present

## 2020-08-28 DIAGNOSIS — H401132 Primary open-angle glaucoma, bilateral, moderate stage: Secondary | ICD-10-CM | POA: Diagnosis not present

## 2020-09-20 DIAGNOSIS — H401132 Primary open-angle glaucoma, bilateral, moderate stage: Secondary | ICD-10-CM | POA: Diagnosis not present

## 2020-10-05 DIAGNOSIS — Z6822 Body mass index (BMI) 22.0-22.9, adult: Secondary | ICD-10-CM | POA: Diagnosis not present

## 2020-10-05 DIAGNOSIS — J340 Abscess, furuncle and carbuncle of nose: Secondary | ICD-10-CM | POA: Diagnosis not present

## 2020-10-11 DIAGNOSIS — H401132 Primary open-angle glaucoma, bilateral, moderate stage: Secondary | ICD-10-CM | POA: Diagnosis not present

## 2020-10-20 DIAGNOSIS — L720 Epidermal cyst: Secondary | ICD-10-CM | POA: Diagnosis not present

## 2020-10-23 DIAGNOSIS — H18003 Unspecified corneal deposit, bilateral: Secondary | ICD-10-CM | POA: Diagnosis not present

## 2020-11-03 DIAGNOSIS — L603 Nail dystrophy: Secondary | ICD-10-CM | POA: Diagnosis not present

## 2020-11-03 DIAGNOSIS — L608 Other nail disorders: Secondary | ICD-10-CM | POA: Diagnosis not present

## 2020-11-22 DIAGNOSIS — H401132 Primary open-angle glaucoma, bilateral, moderate stage: Secondary | ICD-10-CM | POA: Diagnosis not present

## 2021-01-15 DIAGNOSIS — Z6822 Body mass index (BMI) 22.0-22.9, adult: Secondary | ICD-10-CM | POA: Diagnosis not present

## 2021-01-15 DIAGNOSIS — N3001 Acute cystitis with hematuria: Secondary | ICD-10-CM | POA: Diagnosis not present

## 2021-01-15 DIAGNOSIS — R0989 Other specified symptoms and signs involving the circulatory and respiratory systems: Secondary | ICD-10-CM | POA: Diagnosis not present

## 2021-02-06 DIAGNOSIS — H25812 Combined forms of age-related cataract, left eye: Secondary | ICD-10-CM | POA: Diagnosis not present

## 2021-02-06 DIAGNOSIS — H25012 Cortical age-related cataract, left eye: Secondary | ICD-10-CM | POA: Diagnosis not present

## 2021-02-06 DIAGNOSIS — Z01818 Encounter for other preprocedural examination: Secondary | ICD-10-CM | POA: Diagnosis not present

## 2021-02-06 DIAGNOSIS — H40033 Anatomical narrow angle, bilateral: Secondary | ICD-10-CM | POA: Diagnosis not present

## 2021-02-27 DIAGNOSIS — H25812 Combined forms of age-related cataract, left eye: Secondary | ICD-10-CM | POA: Diagnosis not present

## 2021-02-27 DIAGNOSIS — H401120 Primary open-angle glaucoma, left eye, stage unspecified: Secondary | ICD-10-CM | POA: Diagnosis not present

## 2021-02-27 DIAGNOSIS — H2512 Age-related nuclear cataract, left eye: Secondary | ICD-10-CM | POA: Diagnosis not present

## 2021-02-27 DIAGNOSIS — H401121 Primary open-angle glaucoma, left eye, mild stage: Secondary | ICD-10-CM | POA: Diagnosis not present

## 2021-02-27 DIAGNOSIS — H401211 Low-tension glaucoma, right eye, mild stage: Secondary | ICD-10-CM | POA: Diagnosis not present

## 2021-02-27 DIAGNOSIS — H40033 Anatomical narrow angle, bilateral: Secondary | ICD-10-CM | POA: Diagnosis not present

## 2021-02-28 DIAGNOSIS — H25812 Combined forms of age-related cataract, left eye: Secondary | ICD-10-CM | POA: Diagnosis not present

## 2021-03-20 DIAGNOSIS — Z23 Encounter for immunization: Secondary | ICD-10-CM | POA: Diagnosis not present

## 2021-05-11 DIAGNOSIS — Z9181 History of falling: Secondary | ICD-10-CM | POA: Diagnosis not present

## 2021-05-11 DIAGNOSIS — Z79899 Other long term (current) drug therapy: Secondary | ICD-10-CM | POA: Diagnosis not present

## 2021-05-11 DIAGNOSIS — M8580 Other specified disorders of bone density and structure, unspecified site: Secondary | ICD-10-CM | POA: Diagnosis not present

## 2021-05-11 DIAGNOSIS — Z0000 Encounter for general adult medical examination without abnormal findings: Secondary | ICD-10-CM | POA: Diagnosis not present

## 2021-05-11 DIAGNOSIS — I70211 Atherosclerosis of native arteries of extremities with intermittent claudication, right leg: Secondary | ICD-10-CM | POA: Diagnosis not present

## 2021-05-11 DIAGNOSIS — Z6823 Body mass index (BMI) 23.0-23.9, adult: Secondary | ICD-10-CM | POA: Diagnosis not present

## 2021-05-11 DIAGNOSIS — J42 Unspecified chronic bronchitis: Secondary | ICD-10-CM | POA: Diagnosis not present

## 2021-05-11 DIAGNOSIS — E785 Hyperlipidemia, unspecified: Secondary | ICD-10-CM | POA: Diagnosis not present

## 2021-08-23 DIAGNOSIS — Z961 Presence of intraocular lens: Secondary | ICD-10-CM | POA: Diagnosis not present

## 2021-09-24 DIAGNOSIS — Z6821 Body mass index (BMI) 21.0-21.9, adult: Secondary | ICD-10-CM | POA: Diagnosis not present

## 2021-09-24 DIAGNOSIS — R222 Localized swelling, mass and lump, trunk: Secondary | ICD-10-CM | POA: Diagnosis not present

## 2021-10-08 DIAGNOSIS — C50311 Malignant neoplasm of lower-inner quadrant of right female breast: Secondary | ICD-10-CM | POA: Diagnosis not present

## 2021-10-08 DIAGNOSIS — N6315 Unspecified lump in the right breast, overlapping quadrants: Secondary | ICD-10-CM | POA: Diagnosis not present

## 2021-10-08 DIAGNOSIS — Z853 Personal history of malignant neoplasm of breast: Secondary | ICD-10-CM | POA: Diagnosis not present

## 2021-10-08 DIAGNOSIS — C50911 Malignant neoplasm of unspecified site of right female breast: Secondary | ICD-10-CM | POA: Diagnosis not present

## 2021-10-08 DIAGNOSIS — R918 Other nonspecific abnormal finding of lung field: Secondary | ICD-10-CM | POA: Diagnosis not present

## 2021-10-11 ENCOUNTER — Telehealth: Payer: Self-pay | Admitting: Hematology and Oncology

## 2021-10-11 ENCOUNTER — Other Ambulatory Visit: Payer: Self-pay

## 2021-10-11 NOTE — Progress Notes (Signed)
Referral and records faxed to [facility] per daughter's request so that pt can be re-established with Dr. Lindi Adie. ?

## 2021-10-11 NOTE — Progress Notes (Signed)
Initial phone contact with newly diagnosed pt. Pt has had breast cancer in the past. Pt requests that I speak with her daughter, Elsye Mccollister, regarding surgical referral. Pt's daughter requested Dr. Lilia Pro since she and her mother's previous breast surgeon was with [facility] [place]. Pt is in declining health at this point  and daughter felt like it would be easier on the patient to stay in [place]. Appt made with Dr. Lilia Pro for Friday, May 19 th, 2023 at 9:00 am. Directions discussed. Encouraged pt and daughter to call with questions or concerns. ?

## 2021-10-11 NOTE — Telephone Encounter (Signed)
Scheduled appt per 5/11 referral. Pt's daughter is aware of appt date and time. Pt's daughter is aware to arrive 15 mins prior to appt time and to bring and updated insurance card. Pt's daughter is aware of appt location.   ?

## 2021-10-18 NOTE — Progress Notes (Signed)
[place] NOTE  Patient Care Team: Street, [name], MD as PCP - General (Family Medicine) Laurell Roof, RN as Registered Nurse  CHIEF COMPLAINTS/PURPOSE OF CONSULTATION:  Newly diagnosed breast cancer  HISTORY OF PRESENTING ILLNESS:  Wendy [name] 76 y.o. female is here because of recent diagnosis of recurrent breast cancer. She presents to the clinic today for a consult.  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  She was seen by Dr. Lilia Pro at [facility] who performed a PET CT scan this morning.  The scan showed no evidence of distant metastatic disease but there was an activity in the colon which was concerning for a malignant process and she is setting up for an appointment with gastroenterology for colonoscopy. Over the past couple of years she has progressively declined in her performance status.  She fell and injured her back and since then she has not been able to walk for more than a few minutes.  She can drive short distances.  Because of this she has been mostly sedentary at home.  I reviewed her records extensively and collaborated the history with the patient.  SUMMARY OF ONCOLOGIC HISTORY: Oncology History  Breast cancer of upper-inner quadrant of right female breast ([place])  08/03/2013 Initial Diagnosis   Breast cancer of upper-inner quadrant of right female breast; invasive ductal carcinoma with ductal carcinoma in situ. The tumor was ER negative PR negative HER-2/neu negative with a proliferation marker Ki-67 13% ([place])    09/07/2013 Surgery   Bilateral Mastectomy and SLN biopsy (Dr.Haywood) Left Breast: Benign; Right Breast IDC with DCIS Grade 3 3 SLN neg ER 0%; PR 0%; Her2: ratio 1.4 (Neg)   09/27/2013 - 01/24/2014 Chemotherapy   FEC adjuvant  chemo given on day 1 of a 21 day cycle with Neulasta on day 2 for granulocyte support.  A total of 6 cycles were given.      12/2013 Genetic Testing   She had negative genetic testing through [facility]. The 22 genes on the panel were ATM, BARD1, BRCA1, BRCA2, BRIP1, CDH1, CHEK2, EPCAM, FANCC, MLH1, MRE11A, MSH2, MSH6, NBN, NF1, PALB2, PMS2, PTEN, RAD50, RAD51C, STK11, and TP53.    01/31/2014 - 01/31/2014 Chemotherapy   Taxol Carboplatin x 1 cycle.  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     02/08/2014 - 02/15/2014 Chemotherapy   Taxol alone given on 9/8 and 9/14, however discontinued due to increasing grade II peripheral neuropathy.    02/21/2014 - 02/21/2014 Chemotherapy   Gemcitabine weekly given x 1, but discontinued due to neutropenia.      10/08/2021 Relapse/Recurrence   Right chest wall mass biopsy: Palpable lump in mastectomy scar on the right chest wall by ultrasound 1 x 1.2 x 1.4 cm, biopsy:, IDC grade 2 ER 0%, PR 0%, Ki-67 25%, HER2 equivocal by IHC FISH pending   Cancer of right breast [facility])     MEDICAL HISTORY:  Past Medical History:  Diagnosis Date   Asthma    as a child   Asthmatic bronchitis    "about q yr" (09/07/2013)   Breast cancer [facility])    "right breast"    Family history of anesthesia complication    "daughter PONV; long time in recovery" (09/07/2013)   Pneumonia    "several times; usually  follows asthmatic bronchitis" (09/07/2013)   Sinus headache    "maybe 2-3 times/month"    SURGICAL HISTORY: Past Surgical History:  Procedure Laterality Date   BREAST BIOPSY Right 07/2013   CLOSED REDUCTION MANDIBLE Right 07/28/2019   Procedure: CLOSED REDUCTION MAXILO-MANDIBULAR FIXATION WITH 25GA WIRE;  Surgeon: Gardner Candle, DMD;  Location: [place];  Service: Oral Surgery;  Laterality: Right;   DILATION AND CURETTAGE OF UTERUS     S/P miscarriage   FINGER FRACTURE SURGERY Left 2010   "middle finger spiral fx; little finger broken; tissue damage"   MASTECTOMY  COMPLETE / SIMPLE  09/07/2013   MASTECTOMY COMPLETE / SIMPLE W/ SENTINEL NODE BIOPSY Right 09/07/2013   MASTECTOMY W/ SENTINEL NODE BIOPSY Bilateral 09/07/2013   Procedure: BILATERAL MASTECTOMY WITH RIGHT SENTINEL LYMPH NODE BIOPSY;  Surgeon: [name], MD;  Location: [place];  Service: General;  Laterality: Bilateral;   PORT-A-CATH REMOVAL Right 04/19/2014   Procedure: REMOVAL PORT-A-CATH;  Surgeon: Fanny Skates, MD;  Location: [place];  Service: General;  Laterality: Right;   PORTACATH PLACEMENT Right 09/07/2013   PORTACATH PLACEMENT Right 09/07/2013   Procedure: INSERTION PORT-A-CATH;  Surgeon: [name], MD;  Location: [place];  Service: General;  Laterality: Right;   TONSILLECTOMY     TUBAL LIGATION  1977   WRIST FRACTURE SURGERY Left 2010    SOCIAL HISTORY: Social History   Socioeconomic History   Marital status: Widowed    Spouse name: Not on file   Number of children: 2   Years of education: Not on file   Highest education level: Not on file  Occupational History   Occupation: retired  Tobacco Use   Smoking status: Never   Smokeless tobacco: Never  Substance and Sexual Activity   Alcohol use: No    Alcohol/week: 0.0 standard drinks   Drug use: No   Sexual activity: Never  Other Topics Concern   Not on file  Social History Narrative   Not on file   Social Determinants of Health   Financial Resource Strain: Not on file  Food Insecurity: Not on file  Transportation Needs: Not on file  Physical Activity: Not on file  Stress: Not on file  Social Connections: Not on file  Intimate Partner Violence: Not on file    FAMILY HISTORY: Family History  Problem Relation Age of Onset   Bladder Cancer Sister 18       worked in Environmental manager, around 2nd hand smoke all her life   Diabetes Sister    Colon cancer Brother 74       non-smoker   Diabetes Brother    Colon cancer Maternal Uncle 20   Osteoporosis Mother    Parkinson's disease Mother    Diabetes  Mother    Rheum arthritis Father    Diabetes Father    Breast cancer Paternal Aunt        dx over 77   Diabetes Maternal Grandmother    Diabetes Maternal Grandfather    Uterine cancer Other 12   Colon cancer Maternal Aunt        dx in her 37s-70s   Breast cancer Cousin        paternal cousin dx in her 4s-70s    ALLERGIES:  has No Known Allergies.  MEDICATIONS:  Current Outpatient Medications  Medication Sig Dispense Refill   brimonidine-timolol (COMBIGAN) 0.2-0.5 % ophthalmic solution Place 1 drop into both eyes every 12 (twelve) hours.     docusate sodium (  COLACE) 100 MG capsule Take 1 capsule (100 mg total) by mouth 2 (two) times daily. 10 capsule 0   senna-docusate (SENNA S) 8.6-50 MG tablet Take 3 tablets by mouth 2 (two) times daily.     valACYclovir (VALTREX) 1000 MG tablet Take 1 tablet (1,000 mg total) by mouth 2 (two) times daily. 14 tablet 0   CALCIUM PO Take 1 tablet by mouth in the morning and at bedtime.     No current facility-administered medications for this visit.    REVIEW OF SYSTEMS:   Constitutional: Denies fevers, chills or abnormal night sweats Palpable lump in the medial aspect of the right chest wall scar Skin: Evidence of shingles infection All other systems were reviewed with the patient and are negative.  PHYSICAL EXAMINATION: ECOG PERFORMANCE STATUS: 2 - Symptomatic, <50% confined to bed  Vitals:   10/24/21 1521  BP: 126/67  Pulse: 82  Resp: 18  Temp: (!) 97.4 F (36.3 C)  SpO2: 99%   Filed Weights   10/24/21 1521  Weight: 157 lb 1.6 oz (71.3 kg)    GENERAL:alert, no distress and comfortable Shingles infection  LABORATORY DATA:  I have reviewed the data as listed Lab Results  Component Value Date   WBC 5.7 07/28/2019   HGB 12.6 07/28/2019   HCT 37.6 07/28/2019   MCV 90.8 07/28/2019   PLT 134 (L) 07/28/2019   Lab Results  Component Value Date   NA 141 07/28/2019   K 3.8 07/28/2019   CL 105 07/28/2019   CO2 25 07/28/2019     RADIOGRAPHIC STUDIES: I have personally reviewed the radiological reports and agreed with the findings in the report.  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 breast 08/03/2013: Right breast biopsy: IDC with DCIS ER 0% PR 0% HER2 0, Ki-67 13% 09/07/2013: Bilateral mastectomies left breast: Benign right breast: Grade 3 IDC with DCIS triple negative 09/27/2013-02/21/2014: Adjuvant chemotherapy FTC, Taxol carbo, Taxol alone, gemcitabine alone 10/24/2021: Relapse/recurrence: Chest wall mass biopsy right: grade 2 IDC ER 0%, PR 0%, Ki-67 25%, her equivocal by [facility] negative  Pathology and radiology counseling: Discussed with the patient, the details of pathology including the type of breast cancer,the clinical staging, the significance of ER, PR and HER-2/neu receptors and the implications for treatment. After reviewing the pathology in detail, we proceeded to discuss the different treatment options between surgery, radiation, chemotherapy, antiestrogen therapies.  PET/CT 10/24/2021: No evidence of distant metastatic disease.  There is hypermetabolic activity in the colon.  There is evidence of the recurrent breast cancer.  Treatment plan: 1.  Surgical excision of the recurrent tumor: I will request Dr. Lilia Pro to proceed with planning for surgery. 2. patient is seeing gastroenterology for colonoscopy. 3.  I did not recommend systemic chemotherapy but recommended radiation to the chest wall.  We will refer her to radiation oncology-referral.  Given all the previous chemotherapy and her relative intolerance to treatments, I do not recommend systemic chemotherapy unless it is HER2 positive.  She had developed neuropathy and her chemo had to be stopped.  There are no great systemic treatment options for adjuvant therapy.   All questions were answered. The patient knows to call the clinic with any problems, questions or concerns.    Harriette Ohara, MD 10/24/21   I  Gardiner Coins am scribing for Dr. Lindi Adie  I have reviewed the above documentation for accuracy and completeness, and I agree with the above.

## 2021-10-19 ENCOUNTER — Encounter: Payer: Self-pay | Admitting: Hematology and Oncology

## 2021-10-19 ENCOUNTER — Other Ambulatory Visit (HOSPITAL_COMMUNITY): Payer: Self-pay | Admitting: Surgery

## 2021-10-19 DIAGNOSIS — C50911 Malignant neoplasm of unspecified site of right female breast: Secondary | ICD-10-CM | POA: Diagnosis not present

## 2021-10-19 DIAGNOSIS — R222 Localized swelling, mass and lump, trunk: Secondary | ICD-10-CM

## 2021-10-24 ENCOUNTER — Inpatient Hospital Stay: Payer: Medicare Other | Attending: Hematology and Oncology | Admitting: Hematology and Oncology

## 2021-10-24 ENCOUNTER — Other Ambulatory Visit: Payer: Self-pay

## 2021-10-24 ENCOUNTER — Encounter (HOSPITAL_COMMUNITY)
Admission: RE | Admit: 2021-10-24 | Discharge: 2021-10-24 | Disposition: A | Discharge status: Home / Self Care | Payer: Medicare Other | Source: Ambulatory Visit | Attending: Surgery | Admitting: Surgery

## 2021-10-24 DIAGNOSIS — Z79899 Other long term (current) drug therapy: Secondary | ICD-10-CM | POA: Insufficient documentation

## 2021-10-24 DIAGNOSIS — N6310 Unspecified lump in the right breast, unspecified quadrant: Secondary | ICD-10-CM | POA: Diagnosis not present

## 2021-10-24 DIAGNOSIS — Z171 Estrogen receptor negative status [ER-]: Secondary | ICD-10-CM

## 2021-10-24 DIAGNOSIS — Z853 Personal history of malignant neoplasm of breast: Secondary | ICD-10-CM | POA: Diagnosis not present

## 2021-10-24 DIAGNOSIS — C50911 Malignant neoplasm of unspecified site of right female breast: Secondary | ICD-10-CM | POA: Insufficient documentation

## 2021-10-24 DIAGNOSIS — J984 Other disorders of lung: Secondary | ICD-10-CM | POA: Diagnosis not present

## 2021-10-24 DIAGNOSIS — C50211 Malignant neoplasm of upper-inner quadrant of right female breast: Secondary | ICD-10-CM | POA: Insufficient documentation

## 2021-10-24 DIAGNOSIS — R222 Localized swelling, mass and lump, trunk: Secondary | ICD-10-CM | POA: Insufficient documentation

## 2021-10-24 DIAGNOSIS — Z9013 Acquired absence of bilateral breasts and nipples: Secondary | ICD-10-CM | POA: Insufficient documentation

## 2021-10-24 DIAGNOSIS — D0512 Intraductal carcinoma in situ of left breast: Secondary | ICD-10-CM | POA: Insufficient documentation

## 2021-10-24 LAB — GLUCOSE, CAPILLARY: Glucose-Capillary: 108 mg/dL — ABNORMAL HIGH (ref 70–99)

## 2021-10-24 MED ORDER — SENNOSIDES-DOCUSATE SODIUM 8.6-50 MG PO TABS: 3.0000 | ORAL_TABLET | Freq: Two times a day (BID) | ORAL | Status: AC

## 2021-10-24 MED ORDER — VALACYCLOVIR HCL 1 G PO TABS: 1000.0000 mg | ORAL_TABLET | Freq: Two times a day (BID) | ORAL | 0 refills | Status: AC

## 2021-10-24 MED ORDER — DOCUSATE SODIUM 100 MG PO CAPS: 100.0000 mg | ORAL_CAPSULE | Freq: Two times a day (BID) | ORAL | 0 refills | Status: AC

## 2021-10-24 MED ORDER — BRIMONIDINE TARTRATE-TIMOLOL 0.2-0.5 % OP SOLN: 1.0000 [drp] | Freq: Two times a day (BID) | OPHTHALMIC | Status: AC

## 2021-10-24 MED ORDER — FLUDEOXYGLUCOSE F - 18 (FDG) INJECTION
8.0000 | Freq: Once | INTRAVENOUS | Status: AC | PRN
  Administered 2021-10-24: 8 via INTRAVENOUS

## 2021-10-24 NOTE — Assessment & Plan Note (Addendum)
08/03/2013: Right breast biopsy: IDC with DCIS ER 0% PR 0% HER2 0, Ki-67 13% 09/07/2013: Bilateral mastectomies left breast: Benign right breast: Grade 3 IDC with DCIS triple negative 09/27/2013-02/21/2014: Adjuvant chemotherapy FTC, Taxol carbo, Taxol alone, gemcitabine alone 10/24/2021: Relapse/recurrence: Chest wall mass biopsy right: grade 2 IDC ER 0%, PR 0%, Ki-67 25%, her equivocal by [facility] negative  Pathology and radiology counseling: Discussed with the patient, the details of pathology including the type of breast cancer,the clinical staging, the significance of ER, PR and HER-2/neu receptors and the implications for treatment. After reviewing the pathology in detail, we proceeded to discuss the different treatment options between surgery, radiation, chemotherapy, antiestrogen therapies.  Treatment plan: 1.  Surgical excision of the recurrent tumor 2. staging scans  Given all the previous chemotherapy and her relative intolerance to treatments, I do not recommend systemic chemotherapy unless it is HER2 positive.

## 2021-10-25 ENCOUNTER — Other Ambulatory Visit: Payer: Self-pay | Admitting: *Deleted

## 2021-10-25 DIAGNOSIS — K5900 Constipation, unspecified: Secondary | ICD-10-CM | POA: Diagnosis not present

## 2021-10-25 DIAGNOSIS — R9389 Abnormal findings on diagnostic imaging of other specified body structures: Secondary | ICD-10-CM | POA: Diagnosis not present

## 2021-10-25 DIAGNOSIS — C50211 Malignant neoplasm of upper-inner quadrant of right female breast: Secondary | ICD-10-CM

## 2021-10-25 DIAGNOSIS — Z800 Family history of malignant neoplasm of digestive organs: Secondary | ICD-10-CM | POA: Diagnosis not present

## 2021-11-02 ENCOUNTER — Encounter: Payer: Self-pay | Admitting: *Deleted

## 2021-11-02 ENCOUNTER — Encounter: Payer: Self-pay | Admitting: Hematology and Oncology

## 2021-11-02 DIAGNOSIS — Z800 Family history of malignant neoplasm of digestive organs: Secondary | ICD-10-CM | POA: Diagnosis not present

## 2021-11-02 DIAGNOSIS — K6389 Other specified diseases of intestine: Secondary | ICD-10-CM | POA: Diagnosis not present

## 2021-11-02 DIAGNOSIS — Z1211 Encounter for screening for malignant neoplasm of colon: Secondary | ICD-10-CM | POA: Diagnosis not present

## 2021-11-02 DIAGNOSIS — R933 Abnormal findings on diagnostic imaging of other parts of digestive tract: Secondary | ICD-10-CM | POA: Diagnosis not present

## 2021-11-07 DIAGNOSIS — C50811 Malignant neoplasm of overlapping sites of right female breast: Secondary | ICD-10-CM | POA: Diagnosis not present

## 2021-11-07 DIAGNOSIS — Z803 Family history of malignant neoplasm of breast: Secondary | ICD-10-CM | POA: Diagnosis not present

## 2021-11-07 DIAGNOSIS — Z9011 Acquired absence of right breast and nipple: Secondary | ICD-10-CM | POA: Diagnosis not present

## 2021-11-07 DIAGNOSIS — C50911 Malignant neoplasm of unspecified site of right female breast: Secondary | ICD-10-CM | POA: Diagnosis not present

## 2021-11-07 DIAGNOSIS — Z853 Personal history of malignant neoplasm of breast: Secondary | ICD-10-CM | POA: Diagnosis not present

## 2021-11-12 DIAGNOSIS — K5900 Constipation, unspecified: Secondary | ICD-10-CM | POA: Diagnosis not present

## 2021-11-13 ENCOUNTER — Encounter: Payer: Self-pay | Admitting: *Deleted

## 2021-11-13 ENCOUNTER — Telehealth: Payer: Self-pay | Admitting: Hematology and Oncology

## 2021-11-13 NOTE — Telephone Encounter (Signed)
.  Called patient to schedule appointment per 6/13 inbasket, patient is aware of date and time.   

## 2021-11-19 DIAGNOSIS — C50911 Malignant neoplasm of unspecified site of right female breast: Secondary | ICD-10-CM | POA: Diagnosis not present

## 2021-11-19 DIAGNOSIS — C50811 Malignant neoplasm of overlapping sites of right female breast: Secondary | ICD-10-CM | POA: Diagnosis not present

## 2021-11-19 DIAGNOSIS — Z171 Estrogen receptor negative status [ER-]: Secondary | ICD-10-CM | POA: Diagnosis not present

## 2021-11-19 DIAGNOSIS — C7989 Secondary malignant neoplasm of other specified sites: Secondary | ICD-10-CM | POA: Diagnosis not present

## 2021-11-27 ENCOUNTER — Inpatient Hospital Stay: Payer: Medicare Other | Admitting: Hematology and Oncology

## 2021-11-30 ENCOUNTER — Inpatient Hospital Stay: Payer: Medicare Other | Admitting: Hematology and Oncology

## 2021-11-30 DIAGNOSIS — Z6823 Body mass index (BMI) 23.0-23.9, adult: Secondary | ICD-10-CM | POA: Diagnosis not present

## 2021-11-30 DIAGNOSIS — R0781 Pleurodynia: Secondary | ICD-10-CM | POA: Diagnosis not present

## 2021-11-30 DIAGNOSIS — M47814 Spondylosis without myelopathy or radiculopathy, thoracic region: Secondary | ICD-10-CM | POA: Diagnosis not present

## 2021-11-30 DIAGNOSIS — C50911 Malignant neoplasm of unspecified site of right female breast: Secondary | ICD-10-CM | POA: Diagnosis not present

## 2021-12-07 ENCOUNTER — Encounter: Payer: Self-pay | Admitting: *Deleted

## 2021-12-10 ENCOUNTER — Telehealth: Payer: Self-pay | Admitting: Hematology and Oncology

## 2021-12-10 NOTE — Telephone Encounter (Signed)
Called per 7/7 inbasket, pt daughter told me they were not interested in r/s post op with Dr.Gudena because they had already followed up with surgeon and radonc, nurse navigator notified.

## 2021-12-11 ENCOUNTER — Encounter: Payer: Self-pay | Admitting: *Deleted

## 2022-02-01 DEATH — deceased
